# Patient Record
Sex: Male | Born: 1940 | Race: Black or African American | Hispanic: No | Marital: Married | Smoking: Former smoker
Health system: Southern US, Community
[De-identification: ages and names within clinical notes are randomized; demographics above are authoritative.]

## PROBLEM LIST (undated history)

## (undated) DIAGNOSIS — M79662 Pain in left lower leg: Secondary | ICD-10-CM

## (undated) DIAGNOSIS — E7800 Pure hypercholesterolemia, unspecified: Secondary | ICD-10-CM

## (undated) DIAGNOSIS — I739 Peripheral vascular disease, unspecified: Secondary | ICD-10-CM

## (undated) DIAGNOSIS — I509 Heart failure, unspecified: Secondary | ICD-10-CM

## (undated) DIAGNOSIS — M5416 Radiculopathy, lumbar region: Secondary | ICD-10-CM

## (undated) DIAGNOSIS — M1990 Unspecified osteoarthritis, unspecified site: Secondary | ICD-10-CM

## (undated) DIAGNOSIS — I10 Essential (primary) hypertension: Secondary | ICD-10-CM

## (undated) DIAGNOSIS — E785 Hyperlipidemia, unspecified: Secondary | ICD-10-CM

## (undated) DIAGNOSIS — Z973 Presence of spectacles and contact lenses: Secondary | ICD-10-CM

## (undated) DIAGNOSIS — I779 Disorder of arteries and arterioles, unspecified: Secondary | ICD-10-CM

## (undated) DIAGNOSIS — K219 Gastro-esophageal reflux disease without esophagitis: Secondary | ICD-10-CM

## (undated) DIAGNOSIS — E119 Type 2 diabetes mellitus without complications: Secondary | ICD-10-CM

## (undated) DIAGNOSIS — R011 Cardiac murmur, unspecified: Secondary | ICD-10-CM

## (undated) DIAGNOSIS — M545 Low back pain: Secondary | ICD-10-CM

## (undated) HISTORY — DX: Pain in left lower leg: M79.662

## (undated) HISTORY — PX: CATARACT EXTRACTION W/ INTRAOCULAR LENS  IMPLANT, BILATERAL: SHX1307

## (undated) HISTORY — DX: Type 2 diabetes mellitus without complications: E11.9

## (undated) HISTORY — DX: Disorder of arteries and arterioles, unspecified: I77.9

## (undated) HISTORY — PX: EYE SURGERY: SHX253

## (undated) HISTORY — PX: COLONOSCOPY: SHX174

## (undated) HISTORY — PX: MULTIPLE TOOTH EXTRACTIONS: SHX2053

## (undated) HISTORY — DX: Radiculopathy, lumbar region: M54.16

## (undated) HISTORY — DX: Hyperlipidemia, unspecified: E78.5

## (undated) HISTORY — DX: Low back pain: M54.5

## (undated) HISTORY — DX: Pure hypercholesterolemia, unspecified: E78.00

## (undated) HISTORY — PX: CIRCUMCISION: SUR203

## (undated) HISTORY — DX: Unspecified osteoarthritis, unspecified site: M19.90

## (undated) HISTORY — DX: Essential (primary) hypertension: I10

## (undated) HISTORY — PX: TONSILLECTOMY: SUR1361

## (undated) HISTORY — DX: Peripheral vascular disease, unspecified: I73.9

## (undated) HISTORY — DX: Cardiac murmur, unspecified: R01.1

---

## 2002-05-16 ENCOUNTER — Encounter: Payer: Self-pay | Admitting: Emergency Medicine

## 2002-05-16 ENCOUNTER — Emergency Department (HOSPITAL_COMMUNITY): Admission: EM | Admit: 2002-05-16 | Discharge: 2002-05-16 | Payer: Self-pay | Admitting: *Deleted

## 2004-09-10 ENCOUNTER — Ambulatory Visit (HOSPITAL_COMMUNITY): Admission: RE | Admit: 2004-09-10 | Discharge: 2004-09-10 | Payer: Self-pay | Admitting: Internal Medicine

## 2004-09-10 ENCOUNTER — Ambulatory Visit: Payer: Self-pay | Admitting: Internal Medicine

## 2009-04-13 ENCOUNTER — Emergency Department (HOSPITAL_COMMUNITY): Admission: EM | Admit: 2009-04-13 | Discharge: 2009-04-13 | Payer: Self-pay | Admitting: Emergency Medicine

## 2009-04-14 ENCOUNTER — Ambulatory Visit (HOSPITAL_COMMUNITY): Admission: RE | Admit: 2009-04-14 | Discharge: 2009-04-14 | Payer: Self-pay | Admitting: Internal Medicine

## 2009-10-17 ENCOUNTER — Encounter: Payer: Self-pay | Admitting: Internal Medicine

## 2009-11-04 ENCOUNTER — Ambulatory Visit: Payer: Self-pay | Admitting: Internal Medicine

## 2009-11-04 ENCOUNTER — Ambulatory Visit (HOSPITAL_COMMUNITY): Admission: RE | Admit: 2009-11-04 | Discharge: 2009-11-04 | Payer: Self-pay | Admitting: Internal Medicine

## 2010-03-24 NOTE — Letter (Signed)
Summary: TRIAGE  TRIAGE   Imported By: Rosine Beat 10/17/2009 11:52:30  _____________________________________________________________________  External Attachment:    Type:   Image     Comment:   External Document

## 2010-05-07 LAB — GLUCOSE, CAPILLARY: Glucose-Capillary: 222 mg/dL — ABNORMAL HIGH (ref 70–99)

## 2010-05-15 LAB — URINALYSIS, ROUTINE W REFLEX MICROSCOPIC
Bilirubin Urine: NEGATIVE
Glucose, UA: NEGATIVE mg/dL
Hgb urine dipstick: NEGATIVE
Specific Gravity, Urine: 1.009 (ref 1.005–1.030)
pH: 7 (ref 5.0–8.0)

## 2010-05-15 LAB — URINE CULTURE

## 2010-07-10 NOTE — Op Note (Signed)
[name], Jeremy Patton           ACCOUNT NO.:  [id number]   MEDICAL RECORD NO.:  [id number]          PATIENT TYPE:  AMB   LOCATION:  DAY                           FACILITY:  [facility]   PHYSICIAN:  R. Roetta Sessions, M.D. DATE OF BIRTH:  Nov 04, 1940   DATE OF PROCEDURE:  09/10/2004  DATE OF DISCHARGE:                                 OPERATIVE REPORT   PROCEDURE:  Screening colonoscopy.   INDICATIONS FOR PROCEDURE:  The patient is a 70 year old gentleman with a  positive family history of colorectal cancer in his brother who was  diagnosed with the disease at age 24. Jeremy Patton had a colonoscopy in  2001 which revealed left sided diverticula. He is devoid of any lower GI  tract symptoms. He comes for high risk screening. This approach has been  discussed with the patient at length. The potential risks, benefits, and  alternatives have been reviewed, questions answered. He is agreeable. Please  see documentation in medical record.   MONITORING:  O2 saturation, blood pressure, pulse and respirations were  monitored throughout the entire procedure.   CONSCIOUS SEDATION:  Versed 2 mg IV, Demerol 50 mg IV in divided doses.   INSTRUMENT:  Olympus videochip system.   FINDINGS:  Digital rectal exam revealed no abnormalities.   ENDOSCOPIC FINDINGS:  Prep was adequate.   RECTUM:  Examination of the rectal mucosa including retroflexed view of the  anal verge revealed only internal hemorrhoids.   COLON:  The colonic mucosa was surveyed from the rectosigmoid junction  through the left transverse right colon to the area of the appendiceal  orifice, ileocecal valve and cecum. These structures were well seen and  photographed for the record. From this level, the scope was slowly  withdrawn. All previously mentioned mucosal surfaces were again seen. The  patient was noted to have scattered sigmoid diverticula. The remaining  colonic mucosa appeared normal. The patient tolerated the procedure  well and  was reacted in endoscopy.   IMPRESSION:  1.  Internal hemorrhoids, minimal, otherwise normal rectum.  2.  Sigmoid diverticula. The remainder of the colonic mucosa appeared      normal.   RECOMMENDATIONS:  1.  Repeat screening colonoscopy in five years.  2.  Diverticulosis literature provided to Jeremy Patton.       RMR/MEDQ  D:  09/10/2004  T:  09/10/2004  Job:  [id number]

## 2010-09-17 ENCOUNTER — Ambulatory Visit (HOSPITAL_COMMUNITY)
Admission: RE | Admit: 2010-09-17 | Discharge: 2010-09-17 | Disposition: A | Discharge status: Home / Self Care | Payer: Medicare Other | Source: Ambulatory Visit | Attending: Internal Medicine | Admitting: Internal Medicine

## 2010-09-17 ENCOUNTER — Other Ambulatory Visit (HOSPITAL_COMMUNITY): Payer: Self-pay | Admitting: Internal Medicine

## 2010-09-17 DIAGNOSIS — R609 Edema, unspecified: Secondary | ICD-10-CM

## 2010-09-17 DIAGNOSIS — M899 Disorder of bone, unspecified: Secondary | ICD-10-CM | POA: Insufficient documentation

## 2010-09-17 DIAGNOSIS — M79609 Pain in unspecified limb: Secondary | ICD-10-CM | POA: Insufficient documentation

## 2010-09-17 DIAGNOSIS — E119 Type 2 diabetes mellitus without complications: Secondary | ICD-10-CM | POA: Insufficient documentation

## 2011-09-28 ENCOUNTER — Ambulatory Visit (INDEPENDENT_AMBULATORY_CARE_PROVIDER_SITE_OTHER): Payer: Medicare Other | Admitting: Ophthalmology

## 2011-09-30 ENCOUNTER — Ambulatory Visit (INDEPENDENT_AMBULATORY_CARE_PROVIDER_SITE_OTHER): Payer: Medicare Other | Admitting: Ophthalmology

## 2011-09-30 DIAGNOSIS — H35039 Hypertensive retinopathy, unspecified eye: Secondary | ICD-10-CM

## 2011-09-30 DIAGNOSIS — I10 Essential (primary) hypertension: Secondary | ICD-10-CM

## 2011-09-30 DIAGNOSIS — E11359 Type 2 diabetes mellitus with proliferative diabetic retinopathy without macular edema: Secondary | ICD-10-CM

## 2011-09-30 DIAGNOSIS — H40019 Open angle with borderline findings, low risk, unspecified eye: Secondary | ICD-10-CM

## 2011-09-30 DIAGNOSIS — E1165 Type 2 diabetes mellitus with hyperglycemia: Secondary | ICD-10-CM

## 2011-09-30 DIAGNOSIS — H43819 Vitreous degeneration, unspecified eye: Secondary | ICD-10-CM

## 2011-10-15 ENCOUNTER — Ambulatory Visit (INDEPENDENT_AMBULATORY_CARE_PROVIDER_SITE_OTHER): Payer: Medicare Other | Admitting: Ophthalmology

## 2011-11-29 DIAGNOSIS — I10 Essential (primary) hypertension: Secondary | ICD-10-CM

## 2011-11-29 DIAGNOSIS — E785 Hyperlipidemia, unspecified: Secondary | ICD-10-CM

## 2011-11-29 DIAGNOSIS — M545 Low back pain, unspecified: Secondary | ICD-10-CM

## 2011-11-29 DIAGNOSIS — E119 Type 2 diabetes mellitus without complications: Secondary | ICD-10-CM

## 2011-11-29 DIAGNOSIS — M5416 Radiculopathy, lumbar region: Secondary | ICD-10-CM

## 2011-11-29 HISTORY — DX: Radiculopathy, lumbar region: M54.16

## 2011-11-29 HISTORY — DX: Type 2 diabetes mellitus without complications: E11.9

## 2011-11-29 HISTORY — DX: Low back pain, unspecified: M54.50

## 2011-11-29 HISTORY — DX: Essential (primary) hypertension: I10

## 2011-11-29 HISTORY — DX: Hyperlipidemia, unspecified: E78.5

## 2011-11-30 ENCOUNTER — Other Ambulatory Visit (HOSPITAL_COMMUNITY): Payer: Self-pay | Admitting: Internal Medicine

## 2011-11-30 DIAGNOSIS — R0989 Other specified symptoms and signs involving the circulatory and respiratory systems: Secondary | ICD-10-CM

## 2011-12-02 ENCOUNTER — Ambulatory Visit (HOSPITAL_COMMUNITY)
Admission: RE | Admit: 2011-12-02 | Discharge: 2011-12-02 | Disposition: A | Discharge status: Home / Self Care | Payer: Medicare Other | Source: Ambulatory Visit | Attending: Internal Medicine | Admitting: Internal Medicine

## 2011-12-02 ENCOUNTER — Other Ambulatory Visit (HOSPITAL_COMMUNITY): Payer: Self-pay | Admitting: Internal Medicine

## 2011-12-02 ENCOUNTER — Other Ambulatory Visit (HOSPITAL_COMMUNITY): Payer: Medicare Other

## 2011-12-02 DIAGNOSIS — I6529 Occlusion and stenosis of unspecified carotid artery: Secondary | ICD-10-CM | POA: Insufficient documentation

## 2011-12-02 DIAGNOSIS — R0989 Other specified symptoms and signs involving the circulatory and respiratory systems: Secondary | ICD-10-CM

## 2011-12-06 ENCOUNTER — Other Ambulatory Visit: Payer: Self-pay

## 2011-12-06 DIAGNOSIS — I6529 Occlusion and stenosis of unspecified carotid artery: Secondary | ICD-10-CM

## 2011-12-09 ENCOUNTER — Encounter: Payer: Self-pay | Admitting: Vascular Surgery

## 2011-12-16 ENCOUNTER — Encounter: Payer: Self-pay | Admitting: Vascular Surgery

## 2011-12-17 ENCOUNTER — Encounter: Payer: Self-pay | Admitting: Vascular Surgery

## 2011-12-17 ENCOUNTER — Other Ambulatory Visit (INDEPENDENT_AMBULATORY_CARE_PROVIDER_SITE_OTHER): Payer: Medicare Other | Admitting: *Deleted

## 2011-12-17 ENCOUNTER — Ambulatory Visit (INDEPENDENT_AMBULATORY_CARE_PROVIDER_SITE_OTHER): Payer: Medicare Other | Admitting: Vascular Surgery

## 2011-12-17 VITALS — BP 171/91 | HR 76 | Resp 18 | Ht 70.5 in | Wt 192.0 lb

## 2011-12-17 DIAGNOSIS — I6529 Occlusion and stenosis of unspecified carotid artery: Secondary | ICD-10-CM

## 2011-12-17 DIAGNOSIS — I6523 Occlusion and stenosis of bilateral carotid arteries: Secondary | ICD-10-CM | POA: Insufficient documentation

## 2011-12-17 NOTE — Progress Notes (Signed)
[facility] [place]  New Carotid Patient  Referred by:  Carylon Perches, MD [address] [place], [place] [address]  Reason for referral: Bilateral carotid stenosis  History of Present Illness  Jeremy Patton is a 71 y.o. (1941/01/27) male who presents with chief complaint: "problem with my neck arteries."  Previous carotid studies demonstrated: RICA >70% stenosis, LICA 50-69% stenosis.  Patient has no history of TIA or stroke symptom.  The patient has never had amaurosis fugax or monocular blindness.  He has had some diplopia.  The patient has never had facial drooping or hemiplegia.  The patient has never had receptive or expressive aphasia.   The patient's risks factors for carotid disease include: DM, hyperlipidemia, HTN, prior smoking.  He notes some sx suggestive BPPV.  Past Medical History  Diagnosis Date  . Diabetes mellitus without complication 11/29/2011  . Hyperlipidemia 11/29/2011  . Low back pain 11/29/2011  . Lumbar radiculopathy 11/29/2011    Left  . Hypertension 11/29/2011  . Pain of left lower leg   . Carotid artery occlusion     Bruit, Right    Past Surgical History  Procedure Date  . Colonoscopy 08/22/2005 and  07/31/2009  . Tonsillectomy   . Circumcision     Age 24    History   Social History  . Marital Status: Married    Spouse Name: N/A    Number of Children: N/A  . Years of Education: N/A   Occupational History  . Not on file.   Social History Main Topics  . Smoking status: Former Smoker -- 30 years    Types: Cigarettes    Quit date: 12/17/1991  . Smokeless tobacco: Former Neurosurgeon    Quit date: 12/17/1986  . Alcohol Use: No  . Drug Use: No  . Sexually Active: Not on file   Other Topics Concern  . Not on file   Social History Narrative  . No narrative on file    Family History  Problem Relation Age of Onset  . Cancer Brother     colon  . Stroke Mother   . Heart disease Mother     Current Outpatient  Prescriptions on File Prior to Visit  Medication Sig Dispense Refill  . aspirin 81 MG tablet Take 81 mg by mouth daily.      . chlorproMAZINE (THORAZINE) 25 MG tablet Take 25 mg by mouth every 6 (six) hours.      . gabapentin (NEURONTIN) 300 MG capsule 300 mg 3 (three) times daily.       Marland Kitchen HUMALOG 100 UNIT/ML injection 3 Units.       Marland Kitchen LANTUS 100 UNIT/ML injection 26 Units daily.       Marland Kitchen lisinopril-hydrochlorothiazide (PRINZIDE,ZESTORETIC) 20-12.5 MG per tablet 2 tablets daily.       . metFORMIN (GLUCOPHAGE) 1000 MG tablet 1,000 mg 2 (two) times daily.       . simvastatin (ZOCOR) 10 MG tablet Take 10 mg by mouth at bedtime.      . Tamsulosin HCl (FLOMAX) 0.4 MG CAPS 0.4 mg daily.       . verapamil (VERELAN PM) 180 MG 24 hr capsule         No Known Allergies  REVIEW OF SYSTEMS:  (Positives checked otherwise negative)  CARDIOVASCULAR:  [ ]  chest pain, [ ]  chest pressure, [ ]  palpitations, [ ]  shortness of breath when laying flat, [ ]  shortness of breath with exertion,   [ ]   pain in feet when walking, [ ]  pain in feet when laying flat, [ ]  history of blood clot in veins (DVT), [ ]  history of phlebitis, [ ]  swelling in legs, [ ]  varicose veins  PULMONARY:  [ ]  productive cough, [ ]  asthma, [ ]  wheezing  NEUROLOGIC:  [ ]  weakness in arms or legs, [ ]  numbness in arms or legs, [ ]  difficulty speaking or slurred speech, [ ]  temporary loss of vision in one eye, [ ]  dizziness  HEMATOLOGIC:  [ ]  bleeding problems, [ ]  problems with blood clotting too easily  MUSCULOSKEL:  [ ]  joint pain, [ ]  joint swelling  GASTROINTEST:  [ ]   Vomiting blood, [ ]   Blood in stool     GENITOURINARY:  [ ]   Burning with urination, [ ]   Blood in urine  PSYCHIATRIC:  [ ]  history of major depression  INTEGUMENTARY:  [ ]  rashes, [ ]  ulcers  CONSTITUTIONAL:  [ ]  fever, [ ]  chills  Physical Examination  Filed Vitals:   12/17/11 1057 12/17/11 1059  BP: 170/81 171/91  Pulse: 74 76  Resp: 18   Height: 5' 10.5"  (1.791 m)   Weight: 192 lb (87.091 kg)    Body mass index is 27.16 kg/(m^2).  General: A&O x 3, WDWN  Head: [place]/AT  Ear/Nose/Throat: Hearing grossly intact, nares w/o erythema or drainage, oropharynx w/o Erythema/Exudate  Eyes: PERRLA, EOMI  Neck: Supple, no nuchal rigidity, no palpable LAD  Pulmonary: Sym exp, good air movt, CTAB, no rales, rhonchi, & wheezing  Cardiac: RRR, Nl S1, S2, no Murmurs, rubs or gallops  Vascular: Vessel Right Left  Radial Palpable Palpable  Ulnar Palpable Palpable  Brachial Palpable Palpable  Carotid Palpable, without bruit Palpable, without bruit  Aorta Not palpable N/A  Femoral Palpable Palpable  Popliteal Not palpable Not palpable  PT Palpable Palpable  DP Palpable Palpable   Gastrointestinal: soft, NTND, -G/R, - HSM, - masses, - CVAT B  Musculoskeletal: M/S 5/5 throughout , Extremities without ischemic changes   Neurologic: CN 2-12 intact , Pain and light touch intact in extremities , Motor exam as listed above  Psychiatric: Judgment intact, Mood & affect appropriate for pt's clinical situation  Dermatologic: See M/S exam for extremity exam, no rashes otherwise noted  Lymph : No Cervical, Axillary, or Inguinal lymphadenopathy   Non-Invasive Vascular Imaging  R CAROTID DUPLEX (Date: 12/17/11):   R ICA stenosis: 60-79% (286/81, ICA/CCA 3.71)  Patent and antegrade R VA  Outside Studies/Documentation 4 pages of outside documents were reviewed including: outside carotid duplex.  Medical Decision Making  Jeremy Patton is a 71 y.o. male who presents with: asx B ICA stenosis < 80%   Based on the patient's vascular studies and examination, I have offered the patient: routine surveillance every 6 months.  No advantage to intervention at this point as asx.  If he should develop a stroke or TIA, NASCET would suggest benefit from CEA,  I discussed in depth with the patient the nature of atherosclerosis, and emphasized the  importance of maximal medical management including strict control of blood pressure, blood glucose, and lipid levels, obtaining regular exercise, antiplatelet agents, and cessation of smoking.    The patient is aware that without maximal medical management the underlying atherosclerotic disease process will progress, limiting the benefit of any interventions.  Thank you for allowing [place] to participate in this patient's care.  Leonides Sake, MD [facility] Office: [phone] Pager: [phone]  12/17/2011, 1:00 PM

## 2012-06-16 ENCOUNTER — Other Ambulatory Visit: Payer: Medicare Other

## 2012-06-16 ENCOUNTER — Ambulatory Visit: Payer: Medicare Other | Admitting: Vascular Surgery

## 2012-10-27 ENCOUNTER — Ambulatory Visit (INDEPENDENT_AMBULATORY_CARE_PROVIDER_SITE_OTHER): Payer: Medicare HMO | Admitting: Ophthalmology

## 2012-11-17 ENCOUNTER — Ambulatory Visit (INDEPENDENT_AMBULATORY_CARE_PROVIDER_SITE_OTHER): Payer: Medicare HMO | Admitting: Ophthalmology

## 2013-09-03 ENCOUNTER — Other Ambulatory Visit: Payer: Self-pay | Admitting: *Deleted

## 2013-09-03 DIAGNOSIS — I6529 Occlusion and stenosis of unspecified carotid artery: Secondary | ICD-10-CM

## 2013-09-13 ENCOUNTER — Encounter: Payer: Self-pay | Admitting: Vascular Surgery

## 2013-09-14 ENCOUNTER — Inpatient Hospital Stay (HOSPITAL_COMMUNITY): Admission: RE | Admit: 2013-09-14 | Payer: Medicare Other | Source: Ambulatory Visit

## 2013-09-14 ENCOUNTER — Ambulatory Visit: Payer: Medicare Other | Admitting: Vascular Surgery

## 2013-10-18 ENCOUNTER — Encounter: Payer: Self-pay | Admitting: Vascular Surgery

## 2013-10-19 ENCOUNTER — Other Ambulatory Visit (HOSPITAL_COMMUNITY): Payer: Medicare Other

## 2013-10-19 ENCOUNTER — Ambulatory Visit: Payer: Medicare Other | Admitting: Vascular Surgery

## 2013-11-12 ENCOUNTER — Ambulatory Visit (INDEPENDENT_AMBULATORY_CARE_PROVIDER_SITE_OTHER): Payer: Medicare HMO | Admitting: Ophthalmology

## 2013-11-12 DIAGNOSIS — H43819 Vitreous degeneration, unspecified eye: Secondary | ICD-10-CM

## 2013-11-12 DIAGNOSIS — E11359 Type 2 diabetes mellitus with proliferative diabetic retinopathy without macular edema: Secondary | ICD-10-CM

## 2013-11-12 DIAGNOSIS — E1065 Type 1 diabetes mellitus with hyperglycemia: Secondary | ICD-10-CM

## 2013-11-12 DIAGNOSIS — E1039 Type 1 diabetes mellitus with other diabetic ophthalmic complication: Secondary | ICD-10-CM

## 2013-11-12 DIAGNOSIS — H35039 Hypertensive retinopathy, unspecified eye: Secondary | ICD-10-CM

## 2013-11-12 DIAGNOSIS — I10 Essential (primary) hypertension: Secondary | ICD-10-CM

## 2013-11-15 ENCOUNTER — Encounter: Payer: Self-pay | Admitting: Vascular Surgery

## 2013-11-16 ENCOUNTER — Encounter: Payer: Self-pay | Admitting: Vascular Surgery

## 2013-11-16 ENCOUNTER — Ambulatory Visit (HOSPITAL_COMMUNITY)
Admission: RE | Admit: 2013-11-16 | Discharge: 2013-11-16 | Disposition: A | Discharge status: Home / Self Care | Payer: Medicare FFS | Source: Ambulatory Visit | Attending: Vascular Surgery | Admitting: Vascular Surgery

## 2013-11-16 ENCOUNTER — Ambulatory Visit (INDEPENDENT_AMBULATORY_CARE_PROVIDER_SITE_OTHER): Payer: Medicare FFS | Admitting: Vascular Surgery

## 2013-11-16 VITALS — BP 167/82 | HR 78 | Resp 18 | Ht 71.0 in | Wt 180.0 lb

## 2013-11-16 DIAGNOSIS — I6529 Occlusion and stenosis of unspecified carotid artery: Secondary | ICD-10-CM | POA: Insufficient documentation

## 2013-11-16 DIAGNOSIS — I658 Occlusion and stenosis of other precerebral arteries: Secondary | ICD-10-CM

## 2013-11-16 DIAGNOSIS — I6523 Occlusion and stenosis of bilateral carotid arteries: Secondary | ICD-10-CM

## 2013-11-16 NOTE — Progress Notes (Signed)
    Established Carotid Patient  History of Present Illness  Jeremy Patton is a 73 y.o. ([birth year]) male who presents with chief complaint: dizziness.  Previous carotid studies demonstrated: RICA 27-78% stenosis, LICA 24-23% stenosis.  Patient has no history of TIA or stroke symptom.  The patient has never had amaurosis fugax or monocular blindness.  The patient has never had facial drooping or hemiplegia.  The patient has never had receptive or expressive aphasia.    The patient's PMH, PSH, SH, FamHx, Med, and Allergies are unchanged from 12/17/11.  On ROS today: continue sx suggest of BPPV, no other vertebrobasilar sx  Physical Examination  Filed Vitals:   11/16/13 1210 11/16/13 1219  BP: 171/88 167/82  Pulse: 76 78  Resp: 18   Height: 5\' 11"  (1.803 m)   Weight: 180 lb (81.647 kg)    Body mass index is 25.12 kg/(m^2).  General: A&O x 3, WDWN  Eyes: PERRLA, EOMI  Neck: Supple, no nuchal rigidity, no palpable LAD  Pulmonary: Sym exp, good air movt, CTAB, no rales, rhonchi, & wheezing  Cardiac: RRR, Nl S1, S2, no Murmurs, rubs or gallops  Vascular: Vessel Right Left  Radial Palpable Palpable  Brachial Palpable Palpable  Carotid Palpable, without bruit Palpable, without bruit  Aorta Not palpable N/A  Femoral Palpable Palpable  Popliteal Not palpable Not palpable  PT Faintly Palpable Faintly Palpable  DP Faintly Palpable Faintly Palpable   Gastrointestinal: soft, NTND, -G/R, - HSM, - masses, - CVAT B  Musculoskeletal: M/S 5/5 throughout , Extremities without ischemic changes   Neurologic: CN 2-12 intact , Pain and light touch intact in extremities , Motor exam as listed above  Non-Invasive Vascular Imaging  CAROTID DUPLEX (Date: 11/16/2013):   R ICA stenosis: 60-79%  R VA: patent and antegrade  L ICA stenosis: 40-59%  L VA: patent and antegrade  Medical Decision Making  Jeremy Patton is a 73 y.o. male who presents with: asx R ICA stenosis  60-79%., asx L ICA stenosis 40-59%   Based on the patient's vascular studies and examination, I have offered the patient: annual B carotid stenosis.  I discussed in depth with the patient the nature of atherosclerosis, and emphasized the importance of maximal medical management including strict control of blood pressure, blood glucose, and lipid levels, antiplatelet agents, obtaining regular exercise, and cessation of smoking.    The patient is aware that without maximal medical management the underlying atherosclerotic disease process will progress, limiting the benefit of any interventions. The patient is currently on a statin: Zocor. The patient is currently on an anti-platelet: ASA.  Thank you for allowing [place] to participate in this patient's care.  Adele Barthel, MD [facility] Office: [phone] Pager: [phone]  11/16/2013, 3:20 PM

## 2013-11-16 NOTE — Addendum Note (Signed)
Addended by: Mena Goes on: 11/16/2013 05:03 PM   Modules accepted: Orders

## 2014-03-22 ENCOUNTER — Ambulatory Visit (INDEPENDENT_AMBULATORY_CARE_PROVIDER_SITE_OTHER): Payer: Medicare HMO | Admitting: Ophthalmology

## 2014-05-17 ENCOUNTER — Ambulatory Visit: Payer: Medicare FFS | Admitting: Family

## 2014-05-17 ENCOUNTER — Other Ambulatory Visit (HOSPITAL_COMMUNITY): Payer: Medicare FFS

## 2014-09-24 ENCOUNTER — Encounter: Payer: Self-pay | Admitting: Internal Medicine

## 2014-11-01 ENCOUNTER — Ambulatory Visit (INDEPENDENT_AMBULATORY_CARE_PROVIDER_SITE_OTHER): Payer: Medicare PPO | Admitting: Gastroenterology

## 2014-11-01 ENCOUNTER — Other Ambulatory Visit: Payer: Self-pay

## 2014-11-01 ENCOUNTER — Encounter: Payer: Self-pay | Admitting: Gastroenterology

## 2014-11-01 VITALS — BP 168/80 | HR 80 | Temp 97.0°F | Ht 71.0 in | Wt 182.4 lb

## 2014-11-01 DIAGNOSIS — Z800 Family history of malignant neoplasm of digestive organs: Secondary | ICD-10-CM | POA: Diagnosis not present

## 2014-11-01 DIAGNOSIS — R1314 Dysphagia, pharyngoesophageal phase: Secondary | ICD-10-CM

## 2014-11-01 DIAGNOSIS — K219 Gastro-esophageal reflux disease without esophagitis: Secondary | ICD-10-CM

## 2014-11-01 DIAGNOSIS — R1310 Dysphagia, unspecified: Secondary | ICD-10-CM | POA: Insufficient documentation

## 2014-11-01 DIAGNOSIS — R1319 Other dysphagia: Secondary | ICD-10-CM | POA: Insufficient documentation

## 2014-11-01 MED ORDER — PEG 3350-KCL-NA BICARB-NACL 420 G PO SOLR: 4000.0000 mL | Freq: Once | ORAL | Status: DC

## 2014-11-01 NOTE — Patient Instructions (Signed)
1. Upper endoscopy and colonoscopy with Dr. Rourk. See separate instructions. 

## 2014-11-01 NOTE — Assessment & Plan Note (Signed)
Due for high risk screening colonoscopy.  I have discussed the risks, alternatives, benefits with regards to but not limited to the risk of reaction to medication, bleeding, infection, perforation and the patient is agreeable to proceed. Written consent to be obtained.  

## 2014-11-01 NOTE — Progress Notes (Signed)
Primary Care Physician:  Asencion Noble, MD  Primary Gastroenterologist:  Garfield Cornea, MD   Chief Complaint  Patient presents with  . Colonoscopy    HPI:  Jeremy Patton is a 74 y.o. male here to schedule high risk rating colonoscopy. Brother died with colon cancer at age 18. Patient's last colonoscopy in 2011, no polyps. Denies any lower GI symptoms. No blood in the stool or melena. No weight loss. No constipation, diarrhea, abdominal pain. He complains of chronic GERD, more than 5 years in duration. Has self medicated for years with baking soda and water. Takes at least once daily. Recent flare in heartburn which he describes as severe. Occurred when he forgot to take baking soda for several days while visiting his son. Symptoms are also associated with vague solid food esophageal dysphagia. Feels like he washes his food down at times more than he should have to. Denies pill dysphagia. No prior upper endoscopy.   Current Outpatient Prescriptions  Medication Sig Dispense Refill  . aspirin 81 MG tablet Take 81 mg by mouth daily.    . chlorproMAZINE (THORAZINE) 25 MG tablet Take 25 mg by mouth every 6 (six) hours.    . gabapentin (NEURONTIN) 300 MG capsule 300 mg 3 (three) times daily.     Marland Kitchen HUMALOG 100 UNIT/ML injection Inject 3 Units into the skin every morning.     Marland Kitchen LANTUS 100 UNIT/ML injection Inject 30 Units into the skin every morning.     Marland Kitchen lisinopril-hydrochlorothiazide (PRINZIDE,ZESTORETIC) 20-12.5 MG per tablet 2 tablets daily.     . metFORMIN (GLUCOPHAGE) 1000 MG tablet 1,000 mg 2 (two) times daily with a meal.     . simvastatin (ZOCOR) 10 MG tablet Take 10 mg by mouth at bedtime.    Marland Kitchen spironolactone (ALDACTONE) 25 MG tablet     . Tamsulosin HCl (FLOMAX) 0.4 MG CAPS 0.4 mg daily.     . verapamil (VERELAN PM) 180 MG 24 hr capsule     . polyethylene glycol-electrolytes (NULYTELY/GOLYTELY) 420 G solution Take 4,000 mLs by mouth once. 4000 mL 0   No current facility-administered  medications for this visit.    Allergies as of 11/01/2014  . (No Known Allergies)    Past Medical History  Diagnosis Date  . Diabetes mellitus without complication 86/08/6718  . Hyperlipidemia 11/29/2011  . Low back pain 11/29/2011  . Lumbar radiculopathy 11/29/2011    Left  . Hypertension 11/29/2011  . Pain of left lower leg   . Carotid artery occlusion     Bruit, Right    Past Surgical History  Procedure Laterality Date  . Colonoscopy  08/22/2005 and  07/31/2009    minimal internal hemorrhoids, left-sided diverticulosis  . Tonsillectomy    . Circumcision      Age 1    Family History  Problem Relation Age of Onset  . Colon cancer Brother     Age 22  . Stroke Mother   . Heart disease Mother     Social History   Social History  . Marital Status: Married    Spouse Name: N/A  . Number of Children: N/A  . Years of Education: N/A   Occupational History  . Not on file.   Social History Main Topics  . Smoking status: Former Smoker -- 30 years    Types: Cigarettes    Quit date: 12/17/1991  . Smokeless tobacco: Former Systems developer    Quit date: 12/17/1986  . Alcohol Use: No  . Drug Use: No  .  Sexual Activity: Not on file   Other Topics Concern  . Not on file   Social History Narrative      ROS:  General: Negative for anorexia, weight loss, fever, chills, fatigue, weakness. Eyes: Negative for vision changes.  ENT: Negative for hoarseness, nasal congestion. CV: Negative for chest pain, angina, palpitations, dyspnea on exertion, peripheral edema.  Respiratory: Negative for dyspnea at rest, dyspnea on exertion, cough, sputum, wheezing.  GI: See history of present illness. GU:  Negative for dysuria, hematuria, urinary incontinence, urinary frequency, nocturnal urination.  MS: Negative for joint pain, low back pain.  Derm: Negative for rash or itching.  Neuro: Negative for weakness, abnormal sensation, seizure, frequent headaches, memory loss, confusion.  Psych:  Negative for anxiety, depression, suicidal ideation, hallucinations.  Endo: Negative for unusual weight change.  Heme: Negative for bruising or bleeding. Allergy: Negative for rash or hives.    Physical Examination:  BP 168/80 mmHg  Pulse 80  Temp(Src) 97 F (36.1 C) (Oral)  Ht 5\' 11"  (1.803 m)  Wt 182 lb 6.4 oz (82.736 kg)  BMI 25.45 kg/m2   General: Well-nourished, well-developed in no acute distress.  Head: Normocephalic, atraumatic.   Eyes: Conjunctiva pink, no icterus. Mouth: Oropharyngeal mucosa moist and pink , no lesions erythema or exudate. Neck: Supple without thyromegaly, masses, or lymphadenopathy.  Lungs: Clear to auscultation bilaterally.  Heart: Regular rate and rhythm, no murmurs rubs or gallops.  Abdomen: Bowel sounds are normal, nontender, nondistended, no hepatosplenomegaly or masses, no abdominal bruits or    hernia , no rebound or guarding.   Rectal: Deferred Extremities: No lower extremity edema. No clubbing or deformities.  Neuro: Alert and oriented x 4 , grossly normal neurologically.  Skin: Warm and dry, no rash or jaundice.   Psych: Alert and cooperative, normal mood and affect.

## 2014-11-01 NOTE — Assessment & Plan Note (Signed)
Chronic GERD for years, self-medicates with baking soda. Recent flare of symptoms. Vague solid food dysphagia as outlined. Cannot exclude esophagitis, esophageal web/ring/stricture. EGD +/- ED in near future.  I have discussed the risks, alternatives, benefits with regards to but not limited to the risk of reaction to medication, bleeding, infection, perforation and the patient is agreeable to proceed. Written consent to be obtained.

## 2014-11-04 NOTE — Progress Notes (Signed)
cc'ed to pcp °

## 2014-11-07 ENCOUNTER — Ambulatory Visit (INDEPENDENT_AMBULATORY_CARE_PROVIDER_SITE_OTHER): Payer: Medicare PPO | Admitting: Ophthalmology

## 2014-11-07 DIAGNOSIS — E10359 Type 1 diabetes mellitus with proliferative diabetic retinopathy without macular edema: Secondary | ICD-10-CM

## 2014-11-07 DIAGNOSIS — I10 Essential (primary) hypertension: Secondary | ICD-10-CM

## 2014-11-07 DIAGNOSIS — E10351 Type 1 diabetes mellitus with proliferative diabetic retinopathy with macular edema: Secondary | ICD-10-CM

## 2014-11-07 DIAGNOSIS — H43813 Vitreous degeneration, bilateral: Secondary | ICD-10-CM | POA: Diagnosis not present

## 2014-11-07 DIAGNOSIS — H35033 Hypertensive retinopathy, bilateral: Secondary | ICD-10-CM | POA: Diagnosis not present

## 2014-11-07 DIAGNOSIS — E10311 Type 1 diabetes mellitus with unspecified diabetic retinopathy with macular edema: Secondary | ICD-10-CM

## 2014-11-12 DIAGNOSIS — E119 Type 2 diabetes mellitus without complications: Secondary | ICD-10-CM | POA: Diagnosis not present

## 2014-11-14 ENCOUNTER — Other Ambulatory Visit: Payer: Self-pay

## 2014-11-14 MED ORDER — PEG 3350-KCL-NA BICARB-NACL 420 G PO SOLR: 4000.0000 mL | Freq: Once | ORAL | Status: DC

## 2014-11-19 DIAGNOSIS — I10 Essential (primary) hypertension: Secondary | ICD-10-CM | POA: Diagnosis not present

## 2014-11-19 DIAGNOSIS — Z6825 Body mass index (BMI) 25.0-25.9, adult: Secondary | ICD-10-CM | POA: Diagnosis not present

## 2014-11-19 DIAGNOSIS — I2510 Atherosclerotic heart disease of native coronary artery without angina pectoris: Secondary | ICD-10-CM | POA: Diagnosis not present

## 2014-11-19 DIAGNOSIS — Z23 Encounter for immunization: Secondary | ICD-10-CM | POA: Diagnosis not present

## 2014-11-19 DIAGNOSIS — E1139 Type 2 diabetes mellitus with other diabetic ophthalmic complication: Secondary | ICD-10-CM | POA: Diagnosis not present

## 2014-11-25 ENCOUNTER — Ambulatory Visit (HOSPITAL_COMMUNITY)
Admission: RE | Admit: 2014-11-25 | Discharge: 2014-11-25 | Disposition: A | Discharge status: Home Health | Payer: Medicare PPO | Source: Ambulatory Visit | Attending: Internal Medicine | Admitting: Internal Medicine

## 2014-11-25 ENCOUNTER — Encounter (HOSPITAL_COMMUNITY)
Admission: RE | Disposition: A | Discharge status: Home Health | Payer: Self-pay | Source: Ambulatory Visit | Attending: Internal Medicine

## 2014-11-25 ENCOUNTER — Encounter (HOSPITAL_COMMUNITY): Payer: Self-pay | Admitting: *Deleted

## 2014-11-25 DIAGNOSIS — Z860100 Personal history of colon polyps, unspecified: Secondary | ICD-10-CM | POA: Insufficient documentation

## 2014-11-25 DIAGNOSIS — Z79899 Other long term (current) drug therapy: Secondary | ICD-10-CM | POA: Insufficient documentation

## 2014-11-25 DIAGNOSIS — I10 Essential (primary) hypertension: Secondary | ICD-10-CM | POA: Diagnosis not present

## 2014-11-25 DIAGNOSIS — E785 Hyperlipidemia, unspecified: Secondary | ICD-10-CM | POA: Insufficient documentation

## 2014-11-25 DIAGNOSIS — K648 Other hemorrhoids: Secondary | ICD-10-CM | POA: Diagnosis not present

## 2014-11-25 DIAGNOSIS — E119 Type 2 diabetes mellitus without complications: Secondary | ICD-10-CM | POA: Insufficient documentation

## 2014-11-25 DIAGNOSIS — K635 Polyp of colon: Secondary | ICD-10-CM | POA: Diagnosis not present

## 2014-11-25 DIAGNOSIS — Z794 Long term (current) use of insulin: Secondary | ICD-10-CM | POA: Insufficient documentation

## 2014-11-25 DIAGNOSIS — D124 Benign neoplasm of descending colon: Secondary | ICD-10-CM | POA: Diagnosis not present

## 2014-11-25 DIAGNOSIS — Z800 Family history of malignant neoplasm of digestive organs: Secondary | ICD-10-CM | POA: Insufficient documentation

## 2014-11-25 DIAGNOSIS — Z7982 Long term (current) use of aspirin: Secondary | ICD-10-CM | POA: Insufficient documentation

## 2014-11-25 DIAGNOSIS — Z7984 Long term (current) use of oral hypoglycemic drugs: Secondary | ICD-10-CM | POA: Diagnosis not present

## 2014-11-25 DIAGNOSIS — K2270 Barrett's esophagus without dysplasia: Secondary | ICD-10-CM | POA: Diagnosis not present

## 2014-11-25 DIAGNOSIS — Z87891 Personal history of nicotine dependence: Secondary | ICD-10-CM | POA: Insufficient documentation

## 2014-11-25 DIAGNOSIS — K2100 Gastro-esophageal reflux disease with esophagitis, without bleeding: Secondary | ICD-10-CM | POA: Insufficient documentation

## 2014-11-25 DIAGNOSIS — K5730 Diverticulosis of large intestine without perforation or abscess without bleeding: Secondary | ICD-10-CM | POA: Insufficient documentation

## 2014-11-25 DIAGNOSIS — R1310 Dysphagia, unspecified: Secondary | ICD-10-CM | POA: Insufficient documentation

## 2014-11-25 DIAGNOSIS — K2210 Ulcer of esophagus without bleeding: Secondary | ICD-10-CM | POA: Diagnosis not present

## 2014-11-25 DIAGNOSIS — K3189 Other diseases of stomach and duodenum: Secondary | ICD-10-CM | POA: Insufficient documentation

## 2014-11-25 DIAGNOSIS — K2950 Unspecified chronic gastritis without bleeding: Secondary | ICD-10-CM | POA: Diagnosis not present

## 2014-11-25 DIAGNOSIS — K219 Gastro-esophageal reflux disease without esophagitis: Secondary | ICD-10-CM

## 2014-11-25 DIAGNOSIS — Z1211 Encounter for screening for malignant neoplasm of colon: Secondary | ICD-10-CM | POA: Insufficient documentation

## 2014-11-25 DIAGNOSIS — R1314 Dysphagia, pharyngoesophageal phase: Secondary | ICD-10-CM

## 2014-11-25 HISTORY — PX: ESOPHAGEAL DILATION: SHX303

## 2014-11-25 HISTORY — PX: ESOPHAGOGASTRODUODENOSCOPY: SHX5428

## 2014-11-25 HISTORY — PX: COLONOSCOPY: SHX5424

## 2014-11-25 LAB — GLUCOSE, CAPILLARY: Glucose-Capillary: 172 mg/dL — ABNORMAL HIGH (ref 65–99)

## 2014-11-25 SURGERY — COLONOSCOPY
Anesthesia: Moderate Sedation

## 2014-11-25 MED ORDER — MEPERIDINE HCL 100 MG/ML IJ SOLN
INTRAMUSCULAR | Status: DC | PRN
  Administered 2014-11-25 (×4): 25 mg via INTRAVENOUS

## 2014-11-25 MED ORDER — STERILE WATER FOR IRRIGATION IR SOLN
Status: DC | PRN
  Administered 2014-11-25: 10:00:00

## 2014-11-25 MED ORDER — LIDOCAINE VISCOUS 2 % MT SOLN
OROMUCOSAL | Status: DC | PRN
  Administered 2014-11-25: 8 mL via OROMUCOSAL

## 2014-11-25 MED ORDER — LIDOCAINE VISCOUS 2 % MT SOLN
OROMUCOSAL | Status: AC
  Filled 2014-11-25: qty 15

## 2014-11-25 MED ORDER — SODIUM CHLORIDE 0.9 % IV SOLN
INTRAVENOUS | Status: DC
  Administered 2014-11-25: 10:00:00 via INTRAVENOUS

## 2014-11-25 MED ORDER — MIDAZOLAM HCL 5 MG/5ML IJ SOLN
INTRAMUSCULAR | Status: DC | PRN
  Administered 2014-11-25: 2 mg via INTRAVENOUS
  Administered 2014-11-25 (×4): 1 mg via INTRAVENOUS

## 2014-11-25 MED ORDER — ONDANSETRON HCL 4 MG/2ML IJ SOLN
INTRAMUSCULAR | Status: DC | PRN
  Administered 2014-11-25: 4 mg via INTRAVENOUS

## 2014-11-25 MED ORDER — MEPERIDINE HCL 100 MG/ML IJ SOLN
INTRAMUSCULAR | Status: AC
  Filled 2014-11-25: qty 2

## 2014-11-25 MED ORDER — ONDANSETRON HCL 4 MG/2ML IJ SOLN
INTRAMUSCULAR | Status: AC
  Filled 2014-11-25: qty 2

## 2014-11-25 MED ORDER — MIDAZOLAM HCL 5 MG/5ML IJ SOLN
INTRAMUSCULAR | Status: AC
  Filled 2014-11-25: qty 10

## 2014-11-25 NOTE — Op Note (Signed)
[facility] [address] [place], [address]   COLONOSCOPY PROCEDURE REPORT  PATIENT: Jeremy, Patton  MR#: [id number] BIRTHDATE: 09-21-1940 , 25  yrs. old GENDER: male ENDOSCOPIST: R.  Garfield Cornea, MD FACP [facility] REFERRED BY:Roy Willey Blade, M.D. PROCEDURE DATE:  11-Dec-2014 PROCEDURE:   Colonoscopy with biopsy and snare polypectomy INDICATIONS:High risk screening examination. MEDICATIONS: Versed 6 mg IV and Demerol 100 mg IV in divided doses. Zofran 4 mg IV. ASA CLASS:       Class II  CONSENT: The risks, benefits, alternatives and imponderables including but not limited to bleeding, perforation as well as the possibility of a missed lesion have been reviewed.  The potential for biopsy, lesion removal, etc. have also been discussed. Questions have been answered.  All parties agreeable.  Please see the history and physical in the medical record for more information.  DESCRIPTION OF PROCEDURE:   After the risks benefits and alternatives of the procedure were thoroughly explained, informed consent was obtained.  The digital rectal exam revealed no abnormalities of the rectum.   The EC-[id number] ([id number])  endoscope was introduced through the anus and advanced to the cecum, which was identified by both the appendix and ileocecal valve. No adverse events experienced.   The quality of the prep was adequate  The instrument was then slowly withdrawn as the colon was fully examined. Estimated blood loss is zero unless otherwise noted in this procedure report.      COLON FINDINGS: Anal papilla and internal hemorrhoids; otherwise, normal-appearing rectal mucosa.  Patient had densely populated left-sided diverticulosis; (1) 6 mm pediculated polyp in the mid descending segment; there was an adenomatous appearing fold at the ileocecal valve.  It appeared to be a polyp.  I injected 3-4 mL of normal saline on the proximal side to get it to efface face so I could see it  better.  Ultimately ,this was felt to be a redundant fold ball valving at the ileocecal valve.  It was biopsied.  The descending colon polyp was removed with cold snare technique. Retroflexion was performed. .  Withdrawal time=18 minutes 0 seconds.  The scope was withdrawn and the procedure completed. COMPLICATIONS: There were no immediate complications. EBL 5 mL ENDOSCOPIC IMPRESSION: Colonic diverticulosis. Colonic polyp?"removed as described above. Abnormal ileocecal valve?"likely not clinically significant?"status post biopsy. Internal hemorrhoids and anal papilla  RECOMMENDATIONS: Follow up on pathology. See EGD report.  eSigned:  R. Garfield Cornea, MD [facility] 12/11/2014 11:04 AM   cc:  CPT CODES: ICD CODES:  The ICD and CPT codes recommended by this software are interpretations from the data that the clinical staff has captured with the software.  The verification of the translation of this report to the ICD and CPT codes and modifiers is the sole responsibility of the health care institution and practicing physician where this report was generated.  [employer] will not be held responsible for the validity of the ICD and CPT codes included on this report.  AMA assumes no liability for data contained or not contained herein. CPT is a Designer, television/film set of the [employer].  PATIENT NAME:  Jeremy, Patton MR#: [id number]

## 2014-11-25 NOTE — H&P (View-Only) (Signed)
Primary Care Physician:  Asencion Noble, MD  Primary Gastroenterologist:  Garfield Cornea, MD   Chief Complaint  Patient presents with  . Colonoscopy    HPI:  Jeremy Patton is a 74 y.o. male here to schedule high risk rating colonoscopy. Brother died with colon cancer at age 28. Patient's last colonoscopy in 2011, no polyps. Denies any lower GI symptoms. No blood in the stool or melena. No weight loss. No constipation, diarrhea, abdominal pain. He complains of chronic GERD, more than 5 years in duration. Has self medicated for years with baking soda and water. Takes at least once daily. Recent flare in heartburn which he describes as severe. Occurred when he forgot to take baking soda for several days while visiting his son. Symptoms are also associated with vague solid food esophageal dysphagia. Feels like he washes his food down at times more than he should have to. Denies pill dysphagia. No prior upper endoscopy.   Current Outpatient Prescriptions  Medication Sig Dispense Refill  . aspirin 81 MG tablet Take 81 mg by mouth daily.    . chlorproMAZINE (THORAZINE) 25 MG tablet Take 25 mg by mouth every 6 (six) hours.    . gabapentin (NEURONTIN) 300 MG capsule 300 mg 3 (three) times daily.     Marland Kitchen HUMALOG 100 UNIT/ML injection Inject 3 Units into the skin every morning.     Marland Kitchen LANTUS 100 UNIT/ML injection Inject 30 Units into the skin every morning.     Marland Kitchen lisinopril-hydrochlorothiazide (PRINZIDE,ZESTORETIC) 20-12.5 MG per tablet 2 tablets daily.     . metFORMIN (GLUCOPHAGE) 1000 MG tablet 1,000 mg 2 (two) times daily with a meal.     . simvastatin (ZOCOR) 10 MG tablet Take 10 mg by mouth at bedtime.    Marland Kitchen spironolactone (ALDACTONE) 25 MG tablet     . Tamsulosin HCl (FLOMAX) 0.4 MG CAPS 0.4 mg daily.     . verapamil (VERELAN PM) 180 MG 24 hr capsule     . polyethylene glycol-electrolytes (NULYTELY/GOLYTELY) 420 G solution Take 4,000 mLs by mouth once. 4000 mL 0   No current facility-administered  medications for this visit.    Allergies as of 11/01/2014  . (No Known Allergies)    Past Medical History  Diagnosis Date  . Diabetes mellitus without complication 31/06/4006  . Hyperlipidemia 11/29/2011  . Low back pain 11/29/2011  . Lumbar radiculopathy 11/29/2011    Left  . Hypertension 11/29/2011  . Pain of left lower leg   . Carotid artery occlusion     Bruit, Right    Past Surgical History  Procedure Laterality Date  . Colonoscopy  08/22/2005 and  07/31/2009    minimal internal hemorrhoids, left-sided diverticulosis  . Tonsillectomy    . Circumcision      Age 60    Family History  Problem Relation Age of Onset  . Colon cancer Brother     Age 61  . Stroke Mother   . Heart disease Mother     Social History   Social History  . Marital Status: Married    Spouse Name: N/A  . Number of Children: N/A  . Years of Education: N/A   Occupational History  . Not on file.   Social History Main Topics  . Smoking status: Former Smoker -- 30 years    Types: Cigarettes    Quit date: 12/17/1991  . Smokeless tobacco: Former Systems developer    Quit date: 12/17/1986  . Alcohol Use: No  . Drug Use: No  .  Sexual Activity: Not on file   Other Topics Concern  . Not on file   Social History Narrative      ROS:  General: Negative for anorexia, weight loss, fever, chills, fatigue, weakness. Eyes: Negative for vision changes.  ENT: Negative for hoarseness, nasal congestion. CV: Negative for chest pain, angina, palpitations, dyspnea on exertion, peripheral edema.  Respiratory: Negative for dyspnea at rest, dyspnea on exertion, cough, sputum, wheezing.  GI: See history of present illness. GU:  Negative for dysuria, hematuria, urinary incontinence, urinary frequency, nocturnal urination.  MS: Negative for joint pain, low back pain.  Derm: Negative for rash or itching.  Neuro: Negative for weakness, abnormal sensation, seizure, frequent headaches, memory loss, confusion.  Psych:  Negative for anxiety, depression, suicidal ideation, hallucinations.  Endo: Negative for unusual weight change.  Heme: Negative for bruising or bleeding. Allergy: Negative for rash or hives.    Physical Examination:  BP 168/80 mmHg  Pulse 80  Temp(Src) 97 F (36.1 C) (Oral)  Ht 5\' 11"  (1.803 m)  Wt 182 lb 6.4 oz (82.736 kg)  BMI 25.45 kg/m2   General: Well-nourished, well-developed in no acute distress.  Head: Normocephalic, atraumatic.   Eyes: Conjunctiva pink, no icterus. Mouth: Oropharyngeal mucosa moist and pink , no lesions erythema or exudate. Neck: Supple without thyromegaly, masses, or lymphadenopathy.  Lungs: Clear to auscultation bilaterally.  Heart: Regular rate and rhythm, no murmurs rubs or gallops.  Abdomen: Bowel sounds are normal, nontender, nondistended, no hepatosplenomegaly or masses, no abdominal bruits or    hernia , no rebound or guarding.   Rectal: Deferred Extremities: No lower extremity edema. No clubbing or deformities.  Neuro: Alert and oriented x 4 , grossly normal neurologically.  Skin: Warm and dry, no rash or jaundice.   Psych: Alert and cooperative, normal mood and affect.

## 2014-11-25 NOTE — Discharge Instructions (Addendum)
Colonoscopy Discharge Instructions  Read the instructions outlined below and refer to this sheet in the next few weeks. These discharge instructions provide you with general information on caring for yourself after you leave the hospital. Your doctor may also give you specific instructions. While your treatment has been planned according to the most current medical practices available, unavoidable complications occasionally occur. If you have any problems or questions after discharge, call Dr. Gala Romney at [phone]. ACTIVITY  You may resume your regular activity, but move at a slower pace for the next 24 hours.   Take frequent rest periods for the next 24 hours.   Walking will help get rid of the air and reduce the bloated feeling in your belly (abdomen).   No driving for 24 hours (because of the medicine (anesthesia) used during the test).    Do not sign any important legal documents or operate any machinery for 24 hours (because of the anesthesia used during the test).  NUTRITION  Drink plenty of fluids.   You may resume your normal diet as instructed by your doctor.   Begin with a light meal and progress to your normal diet. Heavy or fried foods are harder to digest and may make you feel sick to your stomach (nauseated).   Avoid alcoholic beverages for 24 hours or as instructed.  MEDICATIONS  You may resume your normal medications unless your doctor tells you otherwise.  WHAT YOU CAN EXPECT TODAY  Some feelings of bloating in the abdomen.   Passage of more gas than usual.   Spotting of blood in your stool or on the toilet paper.  IF YOU HAD POLYPS REMOVED DURING THE COLONOSCOPY:  No aspirin products for 7 days or as instructed.   No alcohol for 7 days or as instructed.   Eat a soft diet for the next 24 hours.  FINDING OUT THE RESULTS OF YOUR TEST Not all test results are available during your visit. If your test results are not back during the visit, make an appointment  with your caregiver to find out the results. Do not assume everything is normal if you have not heard from your caregiver or the medical facility. It is important for you to follow up on all of your test results.  SEEK IMMEDIATE MEDICAL ATTENTION IF:  You have more than a spotting of blood in your stool.   Your belly is swollen (abdominal distention).   You are nauseated or vomiting.   You have a temperature over 101.  You have abdominal pain or discomfort that is severe or gets worse throughout the day. EGD Discharge instructions Please read the instructions outlined below and refer to this sheet in the next few weeks. These discharge instructions provide you with general information on caring for yourself after you leave the hospital. Your doctor may also give you specific instructions. While your treatment has been planned according to the most current medical practices available, unavoidable complications occasionally occur. If you have any problems or questions after discharge, please call your doctor. ACTIVITY You may resume your regular activity but move at a slower pace for the next 24 hours.  Take frequent rest periods for the next 24 hours.  Walking will help expel (get rid of) the air and reduce the bloated feeling in your abdomen.  No driving for 24 hours (because of the anesthesia (medicine) used during the test).  You may shower.  Do not sign any important legal documents or operate any machinery for 24  hours (because of the anesthesia used during the test).  NUTRITION Drink plenty of fluids.  You may resume your normal diet.  Begin with a light meal and progress to your normal diet.  Avoid alcoholic beverages for 24 hours or as instructed by your caregiver.  MEDICATIONS You may resume your normal medications unless your caregiver tells you otherwise.  WHAT YOU CAN EXPECT TODAY You may experience abdominal discomfort such as a feeling of fullness or gas pains.   FOLLOW-UP Your doctor will discuss the results of your test with you.  SEEK IMMEDIATE MEDICAL ATTENTION IF ANY OF THE FOLLOWING OCCUR: Excessive nausea (feeling sick to your stomach) and/or vomiting.  Severe abdominal pain and distention (swelling).  Trouble swallowing.  Temperature over 101 F (37.8 C).  Rectal bleeding or vomiting of blood.     GERD, diverticulosis and colon polyp information provided  Begin Protonix 40 mg daily  Further recommendations to follow pending review of pathology report  Gastroesophageal Reflux Disease, Adult Gastroesophageal reflux disease (GERD) happens when acid from your stomach flows up into the esophagus. When acid comes in contact with the esophagus, the acid causes soreness (inflammation) in the esophagus. Over time, GERD may create small holes (ulcers) in the lining of the esophagus. CAUSES   Increased body weight. This puts pressure on the stomach, making acid rise from the stomach into the esophagus.  Smoking. This increases acid production in the stomach.  Drinking alcohol. This causes decreased pressure in the lower esophageal sphincter (valve or ring of muscle between the esophagus and stomach), allowing acid from the stomach into the esophagus.  Late evening meals and a full stomach. This increases pressure and acid production in the stomach.  A malformed lower esophageal sphincter. Sometimes, no cause is found. SYMPTOMS   Burning pain in the lower part of the mid-chest behind the breastbone and in the mid-stomach area. This may occur twice a week or more often.  Trouble swallowing.  Sore throat.  Dry cough.  Asthma-like symptoms including chest tightness, shortness of breath, or wheezing. DIAGNOSIS  Your caregiver may be able to diagnose GERD based on your symptoms. In some cases, X-rays and other tests may be done to check for complications or to check the condition of your stomach and esophagus. TREATMENT  Your caregiver  may recommend over-the-counter or prescription medicines to help decrease acid production. Ask your caregiver before starting or adding any new medicines.  HOME CARE INSTRUCTIONS   Change the factors that you can control. Ask your caregiver for guidance concerning weight loss, quitting smoking, and alcohol consumption.  Avoid foods and drinks that make your symptoms worse, such as:  Caffeine or alcoholic drinks.  Chocolate.  Peppermint or mint flavorings.  Garlic and onions.  Spicy foods.  Citrus fruits, such as oranges, lemons, or limes.  Tomato-based foods such as sauce, chili, salsa, and pizza.  Fried and fatty foods.  Avoid lying down for the 3 hours prior to your bedtime or prior to taking a nap.  Eat small, frequent meals instead of large meals.  Wear loose-fitting clothing. Do not wear anything tight around your waist that causes pressure on your stomach.  Raise the head of your bed 6 to 8 inches with wood blocks to help you sleep. Extra pillows will not help.  Only take over-the-counter or prescription medicines for pain, discomfort, or fever as directed by your caregiver.  Do not take aspirin, ibuprofen, or other nonsteroidal anti-inflammatory drugs (NSAIDs). SEEK IMMEDIATE MEDICAL CARE IF:  You have pain in your arms, neck, jaw, teeth, or back.  Your pain increases or changes in intensity or duration.  You develop nausea, vomiting, or sweating (diaphoresis).  You develop shortness of breath, or you faint.  Your vomit is green, yellow, black, or looks like coffee grounds or blood.  Your stool is red, bloody, or black. These symptoms could be signs of other problems, such as heart disease, gastric bleeding, or esophageal bleeding. MAKE SURE YOU:   Understand these instructions.  Will watch your condition.  Will get help right away if you are not doing well or get worse.   Diverticulosis Diverticulosis is the condition that develops when small pouches  (diverticula) form in the wall of your colon. Your colon, or large intestine, is where water is absorbed and stool is formed. The pouches form when the inside layer of your colon pushes through weak spots in the outer layers of your colon. CAUSES  No one knows exactly what causes diverticulosis. RISK FACTORS  Being older than 90. Your risk for this condition increases with age. Diverticulosis is rare in people younger than 40 years. By age 56, almost everyone has it.  Eating a low-fiber diet.  Being frequently constipated.  Being overweight.  Not getting enough exercise.  Smoking.  Taking over-the-counter pain medicines, like aspirin and ibuprofen. SYMPTOMS  Most people with diverticulosis do not have symptoms. DIAGNOSIS  Because diverticulosis often has no symptoms, health care providers often discover the condition during an exam for other colon problems. In many cases, a health care provider will diagnose diverticulosis while using a flexible scope to examine the colon (colonoscopy). TREATMENT  If you have never developed an infection related to diverticulosis, you may not need treatment. If you have had an infection before, treatment may include:  Eating more fruits, vegetables, and grains.  Taking a fiber supplement.  Taking a live bacteria supplement (probiotic).  Taking medicine to relax your colon. HOME CARE INSTRUCTIONS   Drink at least 6-8 glasses of water each day to prevent constipation.  Try not to strain when you have a bowel movement.  Keep all follow-up appointments. If you have had an infection before:  Increase the fiber in your diet as directed by your health care provider or dietitian.  Take a dietary fiber supplement if your health care provider approves.  Only take medicines as directed by your health care provider. SEEK MEDICAL CARE IF:   You have abdominal pain.  You have bloating.  You have cramps.  You have not gone to the bathroom in 3  days. SEEK IMMEDIATE MEDICAL CARE IF:   Your pain gets worse.  Yourbloating becomes very bad.  You have a fever or chills, and your symptoms suddenly get worse.  You begin vomiting.  You have bowel movements that are bloody or black. MAKE SURE YOU:  Understand these instructions.  Will watch your condition.  Will get help right away if you are not doing well or get worse. Colon Polyps Polyps are lumps of extra tissue growing inside the body. Polyps can grow in the large intestine (colon). Most colon polyps are noncancerous (benign). However, some colon polyps can become cancerous over time. Polyps that are larger than a pea may be harmful. To be safe, caregivers remove and test all polyps. CAUSES  Polyps form when mutations in the genes cause your cells to grow and divide even though no more tissue is needed. RISK FACTORS There are a number of risk factors that  can increase your chances of getting colon polyps. They include:  Being older than 50 years.  Family history of colon polyps or colon cancer.  Long-term colon diseases, such as colitis or Crohn disease.  Being overweight.  Smoking.  Being inactive.  Drinking too much alcohol. SYMPTOMS  Most small polyps do not cause symptoms. If symptoms are present, they may include:  Blood in the stool. The stool may look dark red or black.  Constipation or diarrhea that lasts longer than 1 week. DIAGNOSIS People often do not know they have polyps until their caregiver finds them during a regular checkup. Your caregiver can use 4 tests to check for polyps:  Digital rectal exam. The caregiver wears gloves and feels inside the rectum. This test would find polyps only in the rectum.  Barium enema. The caregiver puts a liquid called barium into your rectum before taking X-rays of your colon. Barium makes your colon look white. Polyps are dark, so they are easy to see in the X-ray pictures.  Sigmoidoscopy. A thin, flexible  tube (sigmoidoscope) is placed into your rectum. The sigmoidoscope has a light and tiny camera in it. The caregiver uses the sigmoidoscope to look at the last third of your colon.  Colonoscopy. This test is like sigmoidoscopy, but the caregiver looks at the entire colon. This is the most common method for finding and removing polyps. TREATMENT  Any polyps will be removed during a sigmoidoscopy or colonoscopy. The polyps are then tested for cancer. PREVENTION  To help lower your risk of getting more colon polyps:  Eat plenty of fruits and vegetables. Avoid eating fatty foods.  Do not smoke.  Avoid drinking alcohol.  Exercise every day.  Lose weight if recommended by your caregiver.  Eat plenty of calcium and folate. Foods that are rich in calcium include milk, cheese, and broccoli. Foods that are rich in folate include chickpeas, kidney beans, and spinach. HOME CARE INSTRUCTIONS Keep all follow-up appointments as directed by your caregiver. You may need periodic exams to check for polyps. SEEK MEDICAL CARE IF: You notice bleeding during a bowel movement.

## 2014-11-25 NOTE — Interval H&P Note (Signed)
History and Physical Interval Note:  11/25/2014 9:55 AM  Jeremy Patton  has presented today for surgery, with the diagnosis of GERD, Dysphagia, family history of colon cancer  The various methods of treatment have been discussed with the patient and family. After consideration of risks, benefits and other options for treatment, the patient has consented to  Procedure(s) with comments: COLONOSCOPY (N/A) - 1030 ESOPHAGOGASTRODUODENOSCOPY (EGD) (N/A) ESOPHAGEAL DILATION (N/A) as a surgical intervention .  The patient's history has been reviewed, patient examined, no change in status, stable for surgery.  I have reviewed the patient's chart and labs.  Questions were answered to the patient's satisfaction.     Jeremy Patton  No change. EGD with possible esophageal dilation and colonoscopy per plan.  The risks, benefits, limitations, imponderables and alternatives regarding both EGD and colonoscopy have been reviewed with the patient. Questions have been answered. All parties agreeable.

## 2014-11-25 NOTE — Op Note (Signed)
[facility] [address] [place], [address]   ENDOSCOPY PROCEDURE REPORT  PATIENT: Jeremy Patton, Jeremy Patton  MR#: [id number] BIRTHDATE: 1940/07/13 , 28  yrs. old GENDER: male ENDOSCOPIST: R.  Garfield Cornea, MD FACP FACG REFERRED BY:  Asencion Noble, M.D. PROCEDURE DATE:  Dec 25, 2014 PROCEDURE:  EGD w/ biopsy and Maloney dilation of esophagus INDICATIONS:  Long-standing GERD; esophageal dysphagia. MEDICATIONS: Versed 5 mg IV and Demerol 75 mg IV in divided doses. Xylocaine gel orally.  Zofran 4 mg IV. ASA CLASS:      Class II  CONSENT: The risks, benefits, limitations, alternatives and imponderables have been discussed.  The potential for biopsy, esophogeal dilation, etc. have also been reviewed.  Questions have been answered.  All parties agreeable.  Please see the history and physical in the medical record for more information.  DESCRIPTION OF PROCEDURE: After the risks benefits and alternatives of the procedure were thoroughly explained, informed consent was obtained.  The EG-[id number] ([id number]) endoscope was introduced through the mouth and advanced to the second portion of the duodenum , limited by Without limitations. The instrument was slowly withdrawn as the mucosa was fully examined. Estimated blood loss is zero unless otherwise noted in this procedure report.    The patient had salmon-colored epithelium coming up 2 cm circumferentially from the GE junction with 3 "tongues" coming up another 2 cm into the distal esophagus.  At the leading edge of one of these "tongues" there was a 5 mm x 3 mm ulceration.  No nodularity.  The esophagus did appear patent throughout its course. Stomach empty.  3 cm hiatal hernia.  Antral erosions present.  No ulcer or infiltrating process.  Patent pylorus.  Examination of bulb and second portion revealed bulbar erosions only.  A 56 French Maloney dilator was  passed to full insertion easily.  A look back revealed some trauma at the  GE junction ( approximately 5 mL of blood loss).  No complications seen.  however.  Subsequently, biopsies of the abnormal antrum and distal esophagus were taken separately.  Estimated total blood loss 8 mL. total  Retroflexed views revealed as previously described.     The scope was then withdrawn from the patient and the procedure completed.  COMPLICATIONS: There were no immediate complications.  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  Hiatal hernia. Gastric and duodenal erosions?"status post gastric biopsy  RECOMMENDATIONS: Begin Protonix 40 mg daily. Follow-up on pathology.  REPEAT EXAM:  eSigned:  R. Garfield Cornea, MD [facility] 12/25/2014 10:29 AM    CC:  CPT CODES: ICD CODES:  The ICD and CPT codes recommended by this software are interpretations from the data that the clinical staff has captured with the software.  The verification of the translation of this report to the ICD and CPT codes and modifiers is the sole responsibility of the health care institution and practicing physician where this report was generated.  [employer] will not be held responsible for the validity of the ICD and CPT codes included on this report.  AMA assumes no liability for data contained or not contained herein. CPT is a Designer, television/film set of the [employer].  PATIENT NAME:  Jeremy Patton, Jeremy Patton MR#: [id number]

## 2014-11-28 ENCOUNTER — Encounter (HOSPITAL_COMMUNITY): Payer: Self-pay | Admitting: Internal Medicine

## 2014-12-02 ENCOUNTER — Encounter: Payer: Self-pay | Admitting: Internal Medicine

## 2014-12-03 ENCOUNTER — Telehealth: Payer: Self-pay

## 2014-12-03 NOTE — Telephone Encounter (Signed)
Per RMR- Send letter to patient.  Send copy of letter with path to referring provider and PCP.   OV in one 1 year with extender to set of repeat EGD

## 2014-12-03 NOTE — Telephone Encounter (Signed)
ON RECALL LIST  °

## 2014-12-03 NOTE — Telephone Encounter (Signed)
Letter mailed to the pt.  Please nic.  

## 2014-12-31 ENCOUNTER — Encounter: Payer: Self-pay | Admitting: Vascular Surgery

## 2015-01-03 ENCOUNTER — Ambulatory Visit: Payer: Medicare PPO | Admitting: Family

## 2015-01-03 ENCOUNTER — Encounter (HOSPITAL_COMMUNITY): Payer: Medicare PPO

## 2015-04-17 ENCOUNTER — Encounter: Payer: Self-pay | Admitting: Family

## 2015-04-24 ENCOUNTER — Other Ambulatory Visit: Payer: Self-pay | Admitting: *Deleted

## 2015-04-24 DIAGNOSIS — I6523 Occlusion and stenosis of bilateral carotid arteries: Secondary | ICD-10-CM

## 2015-04-25 ENCOUNTER — Encounter: Payer: Self-pay | Admitting: Family

## 2015-04-25 ENCOUNTER — Ambulatory Visit (INDEPENDENT_AMBULATORY_CARE_PROVIDER_SITE_OTHER): Payer: Medicare Other | Admitting: Family

## 2015-04-25 ENCOUNTER — Telehealth: Payer: Self-pay | Admitting: Vascular Surgery

## 2015-04-25 ENCOUNTER — Ambulatory Visit (HOSPITAL_COMMUNITY)
Admission: RE | Admit: 2015-04-25 | Discharge: 2015-04-25 | Disposition: A | Discharge status: Home / Self Care | Payer: Medicare Other | Source: Ambulatory Visit | Attending: Family | Admitting: Family

## 2015-04-25 ENCOUNTER — Other Ambulatory Visit: Payer: Self-pay | Admitting: Vascular Surgery

## 2015-04-25 VITALS — BP 118/63 | HR 71 | Ht 71.0 in | Wt 185.4 lb

## 2015-04-25 DIAGNOSIS — I10 Essential (primary) hypertension: Secondary | ICD-10-CM | POA: Diagnosis not present

## 2015-04-25 DIAGNOSIS — E119 Type 2 diabetes mellitus without complications: Secondary | ICD-10-CM | POA: Insufficient documentation

## 2015-04-25 DIAGNOSIS — I6523 Occlusion and stenosis of bilateral carotid arteries: Secondary | ICD-10-CM | POA: Insufficient documentation

## 2015-04-25 DIAGNOSIS — E785 Hyperlipidemia, unspecified: Secondary | ICD-10-CM | POA: Insufficient documentation

## 2015-04-25 LAB — BUN: BUN: 26 mg/dL — ABNORMAL HIGH (ref 7–25)

## 2015-04-25 LAB — CREATININE, SERUM: Creat: 1.31 mg/dL — ABNORMAL HIGH (ref 0.70–1.18)

## 2015-04-25 NOTE — Telephone Encounter (Signed)
LM for pt with appt information on cell # [phone].  His CTA is scheduled with Nelda Severe on Monday March 6th @ 8:00am.  No solid foods 4 hours prior, liquids and meds are OK.

## 2015-04-25 NOTE — Progress Notes (Signed)
Chief Complaint: Extracranial Carotid Artery Stenosis   History of Present Illness  Jeremy Patton is a 75 y.o. male patient of Dr. 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 Previous carotid studies demonstrated: RICA [id number] stenosis, LICA 123456 stenosis. Patient has no history of TIA or stroke symptom. The patient has never had amaurosis fugax or monocular blindness. The patient has never had facial drooping or hemiplegia. The patient has never had receptive or expressive aphasia.  Pt states dizziness mostly occurs if he stands up too fast from sitting; also occurs if he turns too fast.    He walks his dog 30-45 minutes daily and lifts weights daily.   Patient has not had previous carotid artery intervention.  The patient denies any history of TIA or stroke symptoms, specifically the patient denies a history of amaurosis fugax or monocular blindness, denies a history unilateral  of facial drooping, denies a history of hemiplegia, and denies a history of receptive or expressive aphasia.    The patient reports rhinorrhea for about 3 weeks, denies dyspnea, denies cough but he has an occasional cough at this time.  Pt Diabetic: yes, states last A1C was 8.3, pt states his A1C in the past was in the teens Pt smoker: former smoker, quit in the 1980's  Pt meds include: Statin : yes ASA: yes Other anticoagulants/antiplatelets: no   Past Medical History  Diagnosis Date  . Diabetes mellitus without complication ([place]) [id number]  . Hyperlipidemia 11/29/2011  . Low back pain 11/29/2011  . Lumbar radiculopathy 11/29/2011    Left  . Hypertension 11/29/2011  . Pain of left lower leg   . Carotid artery occlusion     Bruit, Right    Social History Social History  Substance Use Topics  . Smoking status: Former Smoker -- 30 years    Types: Cigarettes    Quit date: 12/17/1991  . Smokeless tobacco: Former Systems developer    Quit date: 12/17/1986  .  Alcohol Use: No    Family History Family History  Problem Relation Age of Onset  . Colon cancer Brother     Age 40  . Stroke Mother   . Heart disease Mother     before age 74    Surgical History Past Surgical History  Procedure Laterality Date  . Colonoscopy  08/22/2005 and  07/31/2009    minimal internal hemorrhoids, left-sided diverticulosis  . Tonsillectomy    . Circumcision      Age 78  . Colonoscopy N/A 11/25/2014    Procedure: COLONOSCOPY;  Surgeon: Daneil Dolin, MD;  [facility];  Service: Endoscopy;  Laterality: N/A;  1030  . Esophagogastroduodenoscopy N/A 11/25/2014    Procedure: ESOPHAGOGASTRODUODENOSCOPY (EGD);  Surgeon: Daneil Dolin, MD;  [facility];  Service: Endoscopy;  Laterality: N/A;  . Esophageal dilation N/A 11/25/2014    Procedure: ESOPHAGEAL DILATION;  Surgeon: Daneil Dolin, MD;  [facility];  Service: Endoscopy;  Laterality: N/A;    No Known Allergies  Current Outpatient Prescriptions  Medication Sig Dispense Refill  . acetaminophen (TYLENOL) 500 MG tablet Take 1,000 mg by mouth every 6 (six) hours as needed for moderate pain.    Marland Kitchen aspirin 81 MG tablet Take 81 mg by mouth daily.    . chlorproMAZINE (THORAZINE) 25 MG tablet Take 25 mg by mouth every 6 (six) hours.    . gabapentin (NEURONTIN) 300 MG capsule 300 mg 3 (three) times daily.     Marland Kitchen  Insulin Aspart (NOVOLOG FLEXPEN [place]) Inject into the skin.    Marland Kitchen LANTUS 100 UNIT/ML injection Inject 30 Units into the skin every morning.     Marland Kitchen lisinopril-hydrochlorothiazide (PRINZIDE,ZESTORETIC) 20-12.5 MG per tablet Take 2 tablets by mouth daily.     . metFORMIN (GLUCOPHAGE) 1000 MG tablet Take 1,000 mg by mouth 2 (two) times daily with a meal.     . pantoprazole (PROTONIX) 40 MG tablet     . pioglitazone (ACTOS) 15 MG tablet     . polyethylene glycol-electrolytes (NULYTELY/GOLYTELY) 420 G solution Take 4,000 mLs by mouth once. 4000 mL 0  . simvastatin (ZOCOR) 10 MG tablet Take  10 mg by mouth at bedtime.    Marland Kitchen spironolactone (ALDACTONE) 25 MG tablet Take 12.5 mg by mouth daily.     . Tamsulosin HCl (FLOMAX) 0.4 MG CAPS Take 0.4 mg by mouth daily.     . verapamil (CALAN-SR) 180 MG CR tablet Take 180 mg by mouth 2 (two) times daily.    Marland Kitchen HUMALOG 100 UNIT/ML injection Inject 3 Units into the skin every morning. Reported on 04/25/2015     No current facility-administered medications for this visit.    Review of Systems : See HPI for pertinent positives and negatives.  Physical Examination  Filed Vitals:   04/25/15 1112 04/25/15 1114  BP: 121/59 118/63  Pulse: 71   Height: 5\' 11"  (1.803 m)   Weight: 185 lb 6.4 oz (84.097 kg)   SpO2: 99%    Body mass index is 25.87 kg/(m^2).  General: A&O x 3, WDWN, fit appearing male  Eyes: PERRLA  Neck: Supple, no nuchal rigidity  Pulmonary: Sym exp, + rales in right posterior lower fields, no rhonchi or wheezing  Cardiac: RRR, Nl S1, S2, no detected murmur  Vascular: Vessel Right Left  Radial Palpable Palpable  Brachial Palpable Palpable  Carotid Palpable, without bruit Palpable, without bruit  Aorta Not palpable N/A  Femoral Palpable Palpable  Popliteal Not palpable Not palpable  PT Faintly Palpable Faintly Palpable  DP Faintly Palpable Faintly Palpable   Gastrointestinal: soft, NTND, -G/R, - HSM, - palpable masses, - CVAT B  Musculoskeletal: M/S 5/5 throughout , Extremities without ischemic changes   Neurologic: CN 2-12 intact , Pain and light touch intact in extremities , Motor exam as listed above         Non-Invasive Vascular Imaging CAROTID DUPLEX 04/25/2015   Right ICA: 80 - 99 % stenosis, suspect near occlusion due to minimal diastolic flow in the common carotid artery and markedly dampened waveforms in the distal internal carotid artery. Plaque is calcific and cannot be thoroughly evaluated.  Right ECA stenosis of >50% Left ICA: 60 - 79 % stenosis. Bilateral vertebral  artery is antegrade.  Disease progression of both internal carotid arteries compared to the exam of 11/16/13.   Assessment: Jeremy Patton is a 75 y.o. male who has no history of stroke or TIA.  Today's carotid duplex suggests 80 - 99 % right internal carotid artery stenosis, suspect near occlusion due to minimal diastolic flow in the common carotid artery and markedly dampened waveforms in the distal internal carotid artery. Plaque is calcific and cannot be thoroughly evaluated.  Left ICA: stenosis of 60 - 79 % Disease progression of both internal carotid arteries compared to the exam of 11/16/13.   I discussed with Dr. Bridgett Larsson pt HPI and carotid duplex results.  I advised pt to see his PCP as soon as possible re the crackles auscultated in  his posterior right lung fields, his cough, and rhinorrhea.  Pt reports transient orthostatic light-headedness.  His atherosclerotic risk factors include uncontrolled DM and former smoker (quit in the 1980's).  Fortunately he has no known cardiac problems, he exercises daily.   Face to face time with patient was 25 minutes. Over 50% of this time was spent on counseling and coordination of care.   Plan:  CTA neck ASAP to determine if right ICA is occluded or severely stenotic, follow up with Dr. Bridgett Larsson for discussion of results and options. Cardiology consult for risk stratification, contemplating right CEA.    I discussed in depth with the patient the nature of atherosclerosis, and emphasized the importance of maximal medical management including strict control of blood pressure, blood glucose, and lipid levels, obtaining regular exercise, and continued cessation of smoking.  The patient is aware that without maximal medical management the underlying atherosclerotic disease process will progress, limiting the benefit of any interventions. The patient was given information about stroke prevention and what symptoms should prompt the patient to seek  immediate medical care. Thank you for allowing [place] to participate in this patient's care.  Clemon Chambers, RN, MSN, FNP-C [facility] Office: [phone]  Clinic Physician: Bridgett Larsson  04/25/2015 11:22 AM

## 2015-04-25 NOTE — Patient Instructions (Signed)
Stroke Prevention Some medical conditions and behaviors are associated with an increased chance of having a stroke. You may prevent a stroke by making healthy choices and managing medical conditions. HOW CAN I REDUCE MY RISK OF HAVING A STROKE?   Stay physically active. Get at least 30 minutes of activity on most or all days.  Do not smoke. It may also be helpful to avoid exposure to secondhand smoke.  Limit alcohol use. Moderate alcohol use is considered to be:  No more than 2 drinks per day for men.  No more than 1 drink per day for nonpregnant women.  Eat healthy foods. This involves:  Eating 5 or more servings of fruits and vegetables a day.  Making dietary changes that address high blood pressure (hypertension), high cholesterol, diabetes, or obesity.  Manage your cholesterol levels.  Making food choices that are high in fiber and low in saturated fat, trans fat, and cholesterol may control cholesterol levels.  Take any prescribed medicines to control cholesterol as directed by your health care provider.  Manage your diabetes.  Controlling your carbohydrate and sugar intake is recommended to manage diabetes.  Take any prescribed medicines to control diabetes as directed by your health care provider.  Control your hypertension.  Making food choices that are low in salt (sodium), saturated fat, trans fat, and cholesterol is recommended to manage hypertension.  Ask your health care provider if you need treatment to lower your blood pressure. Take any prescribed medicines to control hypertension as directed by your health care provider.  If you are 18-39 years of age, have your blood pressure checked every 3-5 years. If you are 40 years of age or older, have your blood pressure checked every year.  Maintain a healthy weight.  Reducing calorie intake and making food choices that are low in sodium, saturated fat, trans fat, and cholesterol are recommended to manage  weight.  Stop drug abuse.  Avoid taking birth control pills.  Talk to your health care provider about the risks of taking birth control pills if you are over 35 years old, smoke, get migraines, or have ever had a blood clot.  Get evaluated for sleep disorders (sleep apnea).  Talk to your health care provider about getting a sleep evaluation if you snore a lot or have excessive sleepiness.  Take medicines only as directed by your health care provider.  For some people, aspirin or blood thinners (anticoagulants) are helpful in reducing the risk of forming abnormal blood clots that can lead to stroke. If you have the irregular heart rhythm of atrial fibrillation, you should be on a blood thinner unless there is a good reason you cannot take them.  Understand all your medicine instructions.  Make sure that other conditions (such as anemia or atherosclerosis) are addressed. SEEK IMMEDIATE MEDICAL CARE IF:   You have sudden weakness or numbness of the face, arm, or leg, especially on one side of the body.  Your face or eyelid droops to one side.  You have sudden confusion.  You have trouble speaking (aphasia) or understanding.  You have sudden trouble seeing in one or both eyes.  You have sudden trouble walking.  You have dizziness.  You have a loss of balance or coordination.  You have a sudden, severe headache with no known cause.  You have new chest pain or an irregular heartbeat. Any of these symptoms may represent a serious problem that is an emergency. Do not wait to see if the symptoms will   go away. Get medical help at once. Call your local emergency services (911 in [place] Do not drive yourself to the hospital.   This information is not intended to replace advice given to you by your health care provider. Make sure you discuss any questions you have with your health care provider.   Document Released: 03/18/2004 Document Revised: 03/01/2014 Document Reviewed:  08/11/2012 Elsevier Interactive Patient Education 2016 Elsevier Inc.  

## 2015-04-28 ENCOUNTER — Encounter: Payer: Self-pay | Admitting: Vascular Surgery

## 2015-04-28 ENCOUNTER — Ambulatory Visit
Admission: RE | Admit: 2015-04-28 | Discharge: 2015-04-28 | Disposition: A | Discharge status: Home / Self Care | Payer: Medicare Other | Source: Ambulatory Visit | Attending: Family | Admitting: Family

## 2015-04-28 ENCOUNTER — Other Ambulatory Visit: Payer: Self-pay | Admitting: *Deleted

## 2015-04-28 ENCOUNTER — Telehealth: Payer: Self-pay | Admitting: Vascular Surgery

## 2015-04-28 DIAGNOSIS — I6523 Occlusion and stenosis of bilateral carotid arteries: Secondary | ICD-10-CM

## 2015-04-28 DIAGNOSIS — Z01810 Encounter for preprocedural cardiovascular examination: Secondary | ICD-10-CM

## 2015-04-28 MED ORDER — IOPAMIDOL (ISOVUE-370) INJECTION 76%
100.0000 mL | Freq: Once | INTRAVENOUS | Status: AC | PRN
  Administered 2015-04-28: 100 mL via INTRAVENOUS

## 2015-04-28 NOTE — Telephone Encounter (Signed)
Patient is already scheduled for Tuesday 03/07 for cardiology appt, dpm

## 2015-04-28 NOTE — Telephone Encounter (Signed)
-----   Message from Mena Goes, RN sent at 04/28/2015  2:06 PM EST ----- Regarding: schedule   ----- Message -----    From: Conrad Kensington, MD    Sent: 04/28/2015  11:06 AM      To: [address]  Jeremy Patton [id number] 16-Nov-1940  Pt will need ASAP cardiology evaluation for possible carotid intervention if cardiology evaluation not already set up

## 2015-04-29 ENCOUNTER — Ambulatory Visit (INDEPENDENT_AMBULATORY_CARE_PROVIDER_SITE_OTHER): Payer: Medicare Other | Admitting: Cardiovascular Disease

## 2015-04-29 ENCOUNTER — Encounter: Payer: Self-pay | Admitting: Cardiovascular Disease

## 2015-04-29 VITALS — BP 110/60 | HR 79 | Ht 71.0 in | Wt 184.4 lb

## 2015-04-29 DIAGNOSIS — I6523 Occlusion and stenosis of bilateral carotid arteries: Secondary | ICD-10-CM

## 2015-04-29 DIAGNOSIS — I10 Essential (primary) hypertension: Secondary | ICD-10-CM | POA: Diagnosis not present

## 2015-04-29 DIAGNOSIS — I779 Disorder of arteries and arterioles, unspecified: Secondary | ICD-10-CM

## 2015-04-29 DIAGNOSIS — Z01818 Encounter for other preprocedural examination: Secondary | ICD-10-CM | POA: Diagnosis not present

## 2015-04-29 DIAGNOSIS — I739 Peripheral vascular disease, unspecified: Secondary | ICD-10-CM

## 2015-04-29 DIAGNOSIS — E785 Hyperlipidemia, unspecified: Secondary | ICD-10-CM | POA: Diagnosis not present

## 2015-04-29 DIAGNOSIS — R42 Dizziness and giddiness: Secondary | ICD-10-CM

## 2015-04-29 DIAGNOSIS — E1101 Type 2 diabetes mellitus with hyperosmolarity with coma: Secondary | ICD-10-CM

## 2015-04-29 MED ORDER — ROSUVASTATIN CALCIUM 20 MG PO TABS: 20.0000 mg | ORAL_TABLET | Freq: Every day | ORAL | Status: DC

## 2015-04-29 NOTE — Patient Instructions (Addendum)
Your physician has requested that you have an echocardiogram. Echocardiography is a painless test that uses sound waves to create images of your heart. It provides your doctor with information about the size and shape of your heart and how well your heart's chambers and valves are working. This procedure takes approximately one hour. There are no restrictions for this procedure.  Your physician has requested that you have a lexiscan myoview. For further information please visit [email or web address]. Please follow instruction sheet, as given.  Your physician recommends that you return for lab work  Your physician has recommended you make the following change in your medication:   1.) STOP the spironolactone.  2.) STOP the simvastatin. This has been replaced with generic crestor 20 mg   Your physician recommends that you schedule a follow-up work in appointment next week.

## 2015-04-30 ENCOUNTER — Other Ambulatory Visit: Payer: Self-pay | Admitting: Cardiovascular Disease

## 2015-04-30 ENCOUNTER — Telehealth (HOSPITAL_COMMUNITY): Payer: Self-pay | Admitting: *Deleted

## 2015-04-30 NOTE — Telephone Encounter (Signed)
Patient given detailed instructions per Myocardial Perfusion Study Information Sheet for the test on 05/01/15. Patient notified to arrive 15 minutes early and that it is imperative to arrive on time for appointment to keep from having the test rescheduled.  If you need to cancel or reschedule your appointment, please call the office within 24 hours of your appointment. Failure to do so may result in a cancellation of your appointment, and a $50 no show fee. Patient verbalized understanding.Hubbard Robinson, RN

## 2015-05-01 ENCOUNTER — Ambulatory Visit (HOSPITAL_BASED_OUTPATIENT_CLINIC_OR_DEPARTMENT_OTHER): Payer: Medicare Other

## 2015-05-01 ENCOUNTER — Ambulatory Visit (INDEPENDENT_AMBULATORY_CARE_PROVIDER_SITE_OTHER): Payer: Medicare Other | Admitting: Vascular Surgery

## 2015-05-01 ENCOUNTER — Encounter: Payer: Self-pay | Admitting: Vascular Surgery

## 2015-05-01 ENCOUNTER — Other Ambulatory Visit: Payer: Self-pay | Admitting: Cardiovascular Disease

## 2015-05-01 ENCOUNTER — Encounter: Payer: Self-pay | Admitting: Cardiovascular Disease

## 2015-05-01 ENCOUNTER — Ambulatory Visit (HOSPITAL_COMMUNITY)
Admission: RE | Admit: 2015-05-01 | Discharge: 2015-05-01 | Disposition: A | Discharge status: Home / Self Care | Payer: Medicare Other | Source: Ambulatory Visit | Attending: Cardiology | Admitting: Cardiology

## 2015-05-01 ENCOUNTER — Other Ambulatory Visit: Payer: Self-pay

## 2015-05-01 VITALS — BP 117/60 | HR 81 | Temp 97.4°F | Resp 18 | Ht 71.0 in | Wt 185.6 lb

## 2015-05-01 DIAGNOSIS — E109 Type 1 diabetes mellitus without complications: Secondary | ICD-10-CM | POA: Diagnosis not present

## 2015-05-01 DIAGNOSIS — E785 Hyperlipidemia, unspecified: Secondary | ICD-10-CM | POA: Diagnosis not present

## 2015-05-01 DIAGNOSIS — I10 Essential (primary) hypertension: Secondary | ICD-10-CM

## 2015-05-01 DIAGNOSIS — Z01818 Encounter for other preprocedural examination: Secondary | ICD-10-CM

## 2015-05-01 DIAGNOSIS — I119 Hypertensive heart disease without heart failure: Secondary | ICD-10-CM | POA: Diagnosis not present

## 2015-05-01 DIAGNOSIS — R42 Dizziness and giddiness: Secondary | ICD-10-CM | POA: Insufficient documentation

## 2015-05-01 DIAGNOSIS — Z794 Long term (current) use of insulin: Secondary | ICD-10-CM | POA: Insufficient documentation

## 2015-05-01 DIAGNOSIS — I779 Disorder of arteries and arterioles, unspecified: Secondary | ICD-10-CM

## 2015-05-01 DIAGNOSIS — I2510 Atherosclerotic heart disease of native coronary artery without angina pectoris: Secondary | ICD-10-CM | POA: Diagnosis not present

## 2015-05-01 DIAGNOSIS — I739 Peripheral vascular disease, unspecified: Principal | ICD-10-CM

## 2015-05-01 DIAGNOSIS — E1159 Type 2 diabetes mellitus with other circulatory complications: Secondary | ICD-10-CM | POA: Insufficient documentation

## 2015-05-01 LAB — MYOCARDIAL PERFUSION IMAGING
CHL CUP RESTING HR STRESS: 72 {beats}/min
CSEPPHR: 87 {beats}/min
LV dias vol: 113 mL (ref 62–150)
LVSYSVOL: 49 mL
NUC STRESS TID: 1.13
RATE: 0.24
SDS: 3
SRS: 13
SSS: 16

## 2015-05-01 LAB — LIPID PANEL W/O CHOL/HDL RATIO
Cholesterol, Total: 163 mg/dL (ref 100–199)
HDL: 63 mg/dL (ref >39)
LDL CALC: 89 mg/dL (ref 0–99)
TRIGLYCERIDES: 55 mg/dL (ref 0–149)
VLDL Cholesterol Cal: 11 mg/dL (ref 5–40)

## 2015-05-01 LAB — ECHOCARDIOGRAM COMPLETE
E decel time: 282 msec
FS: 46 % — AB (ref 28–44)
IVS/LV PW RATIO, ED: 1.02
LEFT ATRIUM END SYS DIAM: 39 cm
LVOT area: 3.14 cm2
LVOTPV: 91.7 m/s
MVPG: 3 mmHg
MVPKAVEL: 97.7 m/s
PW: 9.11 mm — AB (ref 0.6–1.1)
Stroke v: 60 ml
VTI: 19 cm

## 2015-05-01 LAB — CBC
HEMATOCRIT: 43.8 % (ref 37.5–51.0)
HEMOGLOBIN: 14.5 g/dL (ref 12.6–17.7)
MCH: 30.3 pg (ref 26.6–33.0)
MCHC: 33.1 g/dL (ref 31.5–35.7)
MCV: 91 fL (ref 79–97)
Platelets: 216 10*3/uL (ref 150–379)
RBC: 4.79 x10E6/uL (ref 4.14–5.80)
RDW: 13.7 % (ref 12.3–15.4)
WBC: 5.6 10*3/uL (ref 3.4–10.8)

## 2015-05-01 LAB — COMPREHENSIVE METABOLIC PANEL
ALK PHOS: 58 IU/L (ref 39–117)
ALT: 10 IU/L (ref 0–44)
AST: 11 IU/L (ref 0–40)
Albumin/Globulin Ratio: 1.6 (ref 1.1–2.5)
Albumin: 4.2 g/dL (ref 3.5–4.8)
BILIRUBIN TOTAL: 0.3 mg/dL (ref 0.0–1.2)
BUN/Creatinine Ratio: 20 (ref 10–22)
BUN: 26 mg/dL (ref 8–27)
CHLORIDE: 99 mmol/L (ref 96–106)
CO2: 23 mmol/L (ref 18–29)
Calcium: 9.4 mg/dL (ref 8.6–10.2)
Creatinine, Ser: 1.28 mg/dL — ABNORMAL HIGH (ref 0.76–1.27)
GFR calc Af Amer: 62 mL/min/{1.73_m2} (ref >59)
GFR calc non Af Amer: 54 mL/min/{1.73_m2} — ABNORMAL LOW (ref >59)
GLUCOSE: 219 mg/dL — AB (ref 65–99)
Globulin, Total: 2.7 g/dL (ref 1.5–4.5)
Potassium: 4.6 mmol/L (ref 3.5–5.2)
Sodium: 139 mmol/L (ref 134–144)
Total Protein: 6.9 g/dL (ref 6.0–8.5)

## 2015-05-01 LAB — TSH: TSH: 1.25 u[IU]/mL (ref 0.450–4.500)

## 2015-05-01 MED ORDER — TECHNETIUM TC 99M SESTAMIBI GENERIC - CARDIOLITE
32.7000 | Freq: Once | INTRAVENOUS | Status: AC | PRN
  Administered 2015-05-01: 32.7 via INTRAVENOUS

## 2015-05-01 MED ORDER — TECHNETIUM TC 99M SESTAMIBI GENERIC - CARDIOLITE
10.1000 | Freq: Once | INTRAVENOUS | Status: AC | PRN
  Administered 2015-05-01: 10.1 via INTRAVENOUS

## 2015-05-01 MED ORDER — REGADENOSON 0.4 MG/5ML IV SOLN
0.4000 mg | Freq: Once | INTRAVENOUS | Status: AC
  Administered 2015-05-01: 0.4 mg via INTRAVENOUS

## 2015-05-01 NOTE — Progress Notes (Signed)
Established Carotid Patient  Referred by:  Asencion Noble, MD [address] [place], [place] [address]  History of Present Illness  Jeremy Patton is a 75 y.o. (24-May-1940) male who presents with chief complaint: follow neck CTA.Marland Kitchen  Previous carotid studies demonstrated: RICA XX123456 stenosis, LICA A999333 stenosis.  Patient has no history of TIA or stroke symptom.  The patient has never had amaurosis fugax or monocular blindness.  The patient has never had facial drooping or hemiplegia.  The patient has never had receptive or expressive aphasia.   The patient notes sx c/w orthostatic hypotension.  The patient's risks factors for carotid disease include: DM, HTN, HLD and prior smoking.  Past Medical History  Diagnosis Date  . Diabetes mellitus without complication ([place]) XX123456  . Hyperlipidemia 11/29/2011  . Low back pain 11/29/2011  . Lumbar radiculopathy 11/29/2011    Left  . Hypertension 11/29/2011  . Pain of left lower leg   . Carotid artery occlusion     Bruit, Right    Past Surgical History  Procedure Laterality Date  . Colonoscopy  08/22/2005 and  07/31/2009    minimal internal hemorrhoids, left-sided diverticulosis  . Tonsillectomy    . Circumcision      Age 23  . Colonoscopy N/A 11/25/2014    Procedure: COLONOSCOPY;  Surgeon: Daneil Dolin, MD;  [facility];  Service: Endoscopy;  Laterality: N/A;  1030  . Esophagogastroduodenoscopy N/A 11/25/2014    Procedure: ESOPHAGOGASTRODUODENOSCOPY (EGD);  Surgeon: Daneil Dolin, MD;  [facility];  Service: Endoscopy;  Laterality: N/A;  . Esophageal dilation N/A 11/25/2014    Procedure: ESOPHAGEAL DILATION;  Surgeon: Daneil Dolin, MD;  [facility];  Service: Endoscopy;  Laterality: N/A;    Social History   Social History  . Marital Status: Married    Spouse Name: N/A  . Number of Children: N/A  . Years of Education: N/A   Occupational History  . Not on file.   Social History Main  Topics  . Smoking status: Former Smoker -- 30 years    Types: Cigarettes    Quit date: 12/17/1991  . Smokeless tobacco: Former Systems developer    Quit date: 12/17/1986  . Alcohol Use: No  . Drug Use: No  . Sexual Activity: Not on file   Other Topics Concern  . Not on file   Social History Narrative    Family History  Problem Relation Age of Onset  . Colon cancer Brother     Age 64  . Stroke Mother   . Heart disease Mother     before age 48    Current Outpatient Prescriptions  Medication Sig Dispense Refill  . acetaminophen (TYLENOL) 500 MG tablet Take 1,000 mg by mouth every 6 (six) hours as needed for moderate pain.    Marland Kitchen aspirin 81 MG tablet Take 81 mg by mouth daily.    . chlorproMAZINE (THORAZINE) 25 MG tablet Take 25 mg by mouth every 6 (six) hours.    . gabapentin (NEURONTIN) 300 MG capsule 300 mg 3 (three) times daily.     Marland Kitchen HUMALOG 100 UNIT/ML injection Inject 3 Units into the skin every morning. Reported on 04/25/2015    . Insulin Aspart (NOVOLOG FLEXPEN [place]) Inject into the skin.    Marland Kitchen LANTUS 100 UNIT/ML injection Inject 30 Units into the skin every morning.     Marland Kitchen lisinopril-hydrochlorothiazide (PRINZIDE,ZESTORETIC) 20-12.5 MG per tablet Take 2 tablets by mouth daily.     Marland Kitchen  metFORMIN (GLUCOPHAGE) 1000 MG tablet Take 1,000 mg by mouth 2 (two) times daily with a meal.     . pantoprazole (PROTONIX) 40 MG tablet     . pioglitazone (ACTOS) 15 MG tablet     . polyethylene glycol-electrolytes (NULYTELY/GOLYTELY) 420 G solution Take 4,000 mLs by mouth once. 4000 mL 0  . rosuvastatin (CRESTOR) 20 MG tablet Take 1 tablet (20 mg total) by mouth daily. 30 tablet 6  . Tamsulosin HCl (FLOMAX) 0.4 MG CAPS Take 0.4 mg by mouth daily.     . verapamil (CALAN-SR) 180 MG CR tablet Take 180 mg by mouth 2 (two) times daily.     No current facility-administered medications for this visit.    No Known Allergies   REVIEW OF SYSTEMS:  (Positives checked otherwise negative)  CARDIOVASCULAR:   [ ]   chest pain,  [ ]  chest pressure,  [ ]  palpitations,  [ ]  shortness of breath when laying flat,  [ ]  shortness of breath with exertion,   [ ]  pain in feet when walking,  [ ]  pain in feet when laying flat, [ ]  history of blood clot in veins (DVT),  [ ]  history of phlebitis,  [ ]  swelling in legs,  [ ]  varicose veins  PULMONARY:   [ ]  productive cough,  [ ]  asthma,  [ ]  wheezing  NEUROLOGIC:   [ ]  weakness in arms or legs,  [ ]  numbness in arms or legs,  [ ]  difficulty speaking or slurred speech,  [ ]  temporary loss of vision in one eye,  [x]  dizziness  HEMATOLOGIC:   [ ]  bleeding problems,  [ ]  problems with blood clotting too easily  MUSCULOSKEL:   [ ]  joint pain, [ ]  joint swelling  GASTROINTEST:   [ ]  vomiting blood,  [ ]  blood in stool     GENITOURINARY:   [ ]  burning with urination,  [ ]  blood in urine  PSYCHIATRIC:   [ ]  history of major depression  INTEGUMENTARY:   [ ]  rashes,  [ ]  ulcers  CONSTITUTIONAL:   [ ]  fever,  [ ]  chills   For VQI Use Only  PRE-ADM LIVING: Home  AMB STATUS: Ambulatory  CAD Sx: None  PRIOR CHF: None  STRESS TEST: []  No, [x]  Normal, [ ]  + ischemia, [ ]  + MI, [ ]  Both   Physical Examination  Filed Vitals:   05/01/15 1606  BP: 117/60  Pulse: 81  Temp: 97.4 F (36.3 C)  TempSrc: Oral  Resp: 18  Height: 5\' 11"  (1.803 m)  Weight: 185 lb 9.6 oz (84.188 kg)  SpO2: 97%   Body mass index is 25.9 kg/(m^2).  General: A&O x 3, WDWN  Head: [place]/AT  Ear/Nose/Throat: Hearing grossly intact, nares w/o erythema or drainage, oropharynx w/o Erythema/Exudate, Mallampati score: 3  Eyes: PERRLA, EOMI  Neck: Supple, no nuchal rigidity, no palpable LAD  Pulmonary: Sym exp, good air movt, CTAB, no rales, rhonchi, & wheezing  Cardiac: RRR, Nl S1, S2, no Murmurs, rubs or gallops  Vascular: Vessel Right Left  Radial Palpable Palpable  Brachial Palpable Palpable  Carotid Palpable, without bruit Palpable, without bruit    Aorta  Not palpable N/A  Femoral Palpable Palpable  Popliteal Not palpable Not palpable  PT Palpable Palpable  DP Palpable Palpable   Gastrointestinal: soft, NTND, -G/R, - HSM, - masses, - CVAT B  Musculoskeletal: M/S 5/5 throughout , Extremities without ischemic changes   Neurologic: CN 2-12 intact , Pain and  light touch intact in extremities , Motor exam as listed above  Psychiatric: Judgment intact, Mood & affect appropriate for pt's clinical situation  Dermatologic: See M/S exam for extremity exam, no rashes otherwise noted  Lymph : No Cervical, Axillary, or Inguinal lymphadenopathy    Non-Invasive Vascular Imaging  CAROTID DUPLEX (Date: 05/01/2015):   R ICA stenosis: 80-99%  R VA: patent and antegrade  L ICA stenosis: 60-79%  L VA: patent and antegrade   CTA Neck (04/28/15) 1. 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 Despite these both internal carotid arteries remain patent, although the right ICA caliber is decreased distal to this stenosis. 2. Bilateral vertebral artery atherosclerosis. Mild right vertebral artery origin stenosis. Moderate to severe left vertebral artery origin stenosis due to calcified plaque. 3. No acute findings in the neck.  Based on my review of this patient's CTA, he has B ICA >90% with surgically accessible calcific disease.   Nuclear stress test (05/01/15):  Nuclear stress EF: 56%. The left ventricular ejection fraction is normal (55-65%).   Transthoracic Echocardiogram (05/01/15): - Left ventricle: The cavity size was normal. Systolic function was  normal. The estimated ejection fraction was in the range of 55%  to 60%. Wall motion was normal; there were no regional wall  motion abnormalities. Left ventricular diastolic function  parameters were normal. - Left atrium: The atrium was mildly dilated.   Medical Decision Making  Jeremy Patton is a 75 y.o. male who presents with:  asx B ICA stenosis >80%.   Based on the patient's vascular studies and examination, I have offered the patient: R CEA.    He scheduled for the 18 MAR 17  If everything goes well, would plan on the L CEA in 2 weeks.  I discussed in depth with the patient the nature of atherosclerosis, and emphasized the importance of maximal medical management including strict control of blood pressure, blood glucose, and lipid levels, obtaining regular exercise, antiplatelet agents, and cessation of smoking.   The patient is currently on a statin: Crestor.  The patient is currently on an anti-platelet: ASA.  The patient is aware that without maximal medical management the underlying atherosclerotic disease process will progress, limiting the benefit of any interventions.  Thank you for allowing [place] to participate in this patient's care.   Adele Barthel, MD [facility] Office: [phone] Pager: [phone]  05/01/2015, 4:34 PM

## 2015-05-01 NOTE — Progress Notes (Addendum)
Patient ID: Jeremy Patton, male   DOB: 1940-12-14, 75 y.o.   MRN: [id number]     Primary MD: Dr. Asencion Noble  PATIENT PROFILE: Jeremy Patton is a 75 y.o. male who is referred through the courtesy of Dr. Adele Barthel for preoperative clearance prior to carotid surgery for high-grade bilateral carotid stenoses   HPI:  Jeremy Patton denies any known history of CAD.  He has a history of hypertension, diabetes mellitus, GERD, and prior carotid disease.  Remotely, prior carotid studies had demonstrated moderate disease.  He Jeremy Patton had a history of TIA or stroke, amaurosis fugax or monocular blindness.  He was recently evaluated by Jeremy Balm, NP and carotid duplex study done on 04/25/2015 showed an 80-99% stenosis with high suspicion for near occlusion with calcific plaque as well as 60-79% stenosis.  The patient subsequently underwent CT angiography on 04/28/2015.  This revealed bilateral high-grade radiographic string signs in the internal carotid arteries at the distal bulb levels.  There also is bilateral vertebral artery atherosclerosis.  Mild right vertebral artery arch and stenosis and moderate to severe left vertebral artery are distant stenosis due to calcified plaque.  Cross of need for probable surgery he was referred for cardiac clearance.  The patient admits to long-standing hypertensive history and has been on verapamil 180 mg twice a day, spironolactone 12.5 mg daily, lisinopril/HCTZ 20/12.5 for blood pressure control.  He is diabetic on Actos and metformin in addition to NovoLog insulin.  He also has a history of hyperlipidemia and has been on low dose simvastatin at just 10 mg.  There is also a history of peripheral neuropathy on gabapentin.  He denies chest pain.  He states that he walks daily for aproximatelyy 35-40 minutes.  He is unaware of any significant change in exercise and denies significant dyspnea.  However, he recently has expressed episodes of dizziness.  There  is a remote tobacco history but he quit in the 1980s after smoking for aproximately 25 years.  Past Medical History  Diagnosis Date  . Diabetes mellitus without complication ([place]) [id number]  . Hyperlipidemia 11/29/2011  . Low back pain 11/29/2011  . Lumbar radiculopathy 11/29/2011    Left  . Hypertension 11/29/2011  . Pain of left lower leg   . Carotid artery occlusion     Bruit, Right    Past Surgical History  Procedure Laterality Date  . Colonoscopy  08/22/2005 and  07/31/2009    minimal internal hemorrhoids, left-sided diverticulosis  . Tonsillectomy    . Circumcision      Age 48  . Colonoscopy N/A 11/25/2014    Procedure: COLONOSCOPY;  Surgeon: Daneil Dolin, MD;  Location: [facility];  Service: Endoscopy;  Laterality: N/A;  1030  . Esophagogastroduodenoscopy N/A 11/25/2014    Procedure: ESOPHAGOGASTRODUODENOSCOPY (EGD);  Surgeon: Daneil Dolin, MD;  Location: [facility];  Service: Endoscopy;  Laterality: N/A;  . Esophageal dilation N/A 11/25/2014    Procedure: ESOPHAGEAL DILATION;  Surgeon: Daneil Dolin, MD;  Location: [facility];  Service: Endoscopy;  Laterality: N/A;    No Known Allergies  Current Outpatient Prescriptions  Medication Sig Dispense Refill  . acetaminophen (TYLENOL) 500 MG tablet Take 1,000 mg by mouth every 6 (six) hours as needed for moderate pain.    Marland Kitchen aspirin 81 MG tablet Take 81 mg by mouth daily.    . chlorproMAZINE (THORAZINE) 25 MG tablet Take 25 mg by mouth every 6 (six) hours.    . gabapentin (  NEURONTIN) 300 MG capsule 300 mg 3 (three) times daily.     Marland Kitchen HUMALOG 100 UNIT/ML injection Inject 3 Units into the skin every morning. Reported on 04/25/2015    . Insulin Aspart (NOVOLOG FLEXPEN [place]) Inject into the skin.    Marland Kitchen LANTUS 100 UNIT/ML injection Inject 30 Units into the skin every morning.     Marland Kitchen lisinopril-hydrochlorothiazide (PRINZIDE,ZESTORETIC) 20-12.5 MG per tablet Take 2 tablets by mouth daily.     . metFORMIN (GLUCOPHAGE) 1000 MG tablet  Take 1,000 mg by mouth 2 (two) times daily with a meal.     . pantoprazole (PROTONIX) 40 MG tablet     . pioglitazone (ACTOS) 15 MG tablet     . polyethylene glycol-electrolytes (NULYTELY/GOLYTELY) 420 G solution Take 4,000 mLs by mouth once. 4000 mL 0  . Tamsulosin HCl (FLOMAX) 0.4 MG CAPS Take 0.4 mg by mouth daily.     . verapamil (CALAN-SR) 180 MG CR tablet Take 180 mg by mouth 2 (two) times daily.    . rosuvastatin (CRESTOR) 20 MG tablet Take 1 tablet (20 mg total) by mouth daily. 30 tablet 6   No current facility-administered medications for this visit.    Social History   Social History  . Marital Status: Married    Spouse Name: N/A  . Number of Children: N/A  . Years of Education: N/A   Occupational History  . Not on file.   Social History Main Topics  . Smoking status: Former Smoker -- 30 years    Types: Cigarettes    Quit date: 12/17/1991  . Smokeless tobacco: Former Systems developer    Quit date: 12/17/1986  . Alcohol Use: No  . Drug Use: No  . Sexual Activity: Not on file   Other Topics Concern  . Not on file   Social History Narrative    Family History  Problem Relation Age of Onset  . Colon cancer Brother     Age 68  . Stroke Mother   . Heart disease Mother     before age 47   Family history is notable that his father died at 89 with Alzheimer's disease.  His mother died secondary to myocardial infarction at age 7.  One of 3 brothers is deceased secondary to colon cancer.  He has 2 living sisters.  ROS General: Negative; No fevers, chills, or night sweats; positive for dizziness HEENT: Negative; No changes in vision or hearing, sinus congestion, difficulty swallowing Pulmonary: Negative; No cough, wheezing, shortness of breath, hemoptysis Cardiovascular:  See HPI; No chest pain, presyncope, syncope, palpitations, edema GI: Positive for GERD; No nausea, vomiting, diarrhea, or abdominal pain GU: Negative; No dysuria, hematuria, or difficulty  voiding Musculoskeletal: Negative; no myalgias, joint pain, or weakness Hematologic/Oncologic: Negative; no easy bruising, bleeding Endocrine: Positive for diabetes mellitus Neuro: Positive for peripheral neuropathy Skin: Negative; No rashes or skin lesions Psychiatric: Negative; No behavioral problems, depression Sleep: Negative; No daytime sleepiness, hypersomnolence, bruxism, restless legs, hypnogagnic hallucinations Other comprehensive 14 point system review is negative   Physical Exam BP 110/60 mmHg  Pulse 79  Ht 5\' 11"  (1.803 m)  Wt 184 lb 6.4 oz (83.643 kg)  BMI 25.73 kg/m2  Wt Readings from Last 3 Encounters:  05/01/15 185 lb 9.6 oz (84.188 kg)  04/29/15 184 lb 6.4 oz (83.643 kg)  04/25/15 185 lb 6.4 oz (84.097 kg)   General: Alert, oriented, no distress.  Skin: normal turgor, no rashes, warm and dry HEENT: Normocephalic, atraumatic. Pupils equal round and reactive  to light; sclera anicteric; extraocular muscles intact; Fundi Without hemorrhages or exudates Nose without nasal septal hypertrophy Mouth/Parynx benign; Mallinpatti scale Neck: High-pitched bilateral carotid bruits; No JVD,  Lungs: clear to ausculatation and percussion; no wheezing or rales Chest wall: without tenderness to palpitation Heart: PMI not displaced, RRR, s1 s2 normal, 1/6 systolic murmur, no diastolic murmur, no rubs, gallops, thrills, or heaves Abdomen: soft, nontender; no hepatosplenomehaly, BS+; abdominal aorta nontender and not dilated by palpation. Back: no CVA tenderness Pulses 2+ Musculoskeletal: full range of motion, normal strength, no joint deformities Extremities: no clubbing cyanosis or edema, Homan's sign negative  Neurologic: grossly nonfocal; Cranial nerves grossly wnl Psychologic: Normal mood and affect   ECG (independently read by me): Normal sinus rhythm at 79 bpm.  Left axis deviation.  No ectopy.  LABS: BMP Latest Ref Rng 04/25/2015  BUN 7 - 25 mg/dL 26(H)  Creatinine 0.70  - 1.18 mg/dL 1.31(H)     No flowsheet data found.  No flowsheet data found. No results found for: MCV No results found for: TSH No results found for: HGBA1C   BNP No results found for: BNP  ProBNP No results found for: PROBNP   Lipid Panel  No results found for: CHOL, TRIG, HDL, CHOLHDL, VLDL, LDLCALC, LDLDIRECT  RADIOLOGY: Ct Angio Neck W/cm &/or Wo/cm  04/28/2015  ADDENDUM REPORT: 04/28/2015 09:48 ADDENDUM: Study discussed by telephone with NP Jeremy Patton on 04/28/2015 at 0946 hours. Electronically Signed   By: Genevie Ann M.D.   On: 04/28/2015 09:48  04/28/2015  CLINICAL DATA:  75 year old male with right ICA severe stenosis versus occlusion. Subsequent encounter. EXAM: CT ANGIOGRAPHY NECK TECHNIQUE: Multidetector CT imaging of the neck was performed using the standard protocol during bolus administration of intravenous contrast. Multiplanar CT image reconstructions and MIPs were obtained to evaluate the vascular anatomy. Carotid stenosis measurements (when applicable) are obtained utilizing NASCET criteria, using the distal internal carotid diameter as the denominator. CONTRAST:  100 mL Isovue 370 COMPARISON:  [facility] Carotid Doppler ultrasound 12/02/2011 FINDINGS: Aortic arch: Bovine type arch configuration. Mild arch calcified atherosclerosis. More extensive partially visible left coronary calcified atherosclerosis (series 5, image 1). Right carotid system: Mild plaque at the right CCA origin without stenosis. Mild circumferential soft plaque in the right CCA proximal to the bifurcation. At the right carotid bifurcation there is extensive calcified and soft plaque continuing into the right ICA bulb. From its origin there is mild to moderate stenosis along the first 12-13 mm of the right ICA (series 604, image 100). Then at the distal bulb level there is focal high-grade radiographic string sign stenosis (same image). Despite this the right ICA remains patent to the skullbase.  The distal cervical right ICA and visible siphon are smaller than the left ICA but otherwise normal. Left carotid system: Bovine type left CCA origin with no stenosis. Mild proximal left CCA tortuosity and circumferential soft plaque. Partially retropharyngeal course of the left carotid bifurcation and left ICA incidentally noted. At the left carotid bifurcation there is bulky mostly calcified plaque extending from the ICA origin to the distal bulb where there is a 4-5 mm segment of high-grade radiographic string sign stenosis (series 603, image 124). Despite this the left ICA remains patent, and is otherwise negative to the left petrous segment. Vertebral arteries: No proximal right subclavian artery stenosis. Calcified plaque at the right vertebral artery origin resulting an mild stenosis (series 603, image 142). Intermittent V2 segment calcification and tortuosity but no hemodynamically significant stenosis  to the skullbase. The right PICA origin is patent. There is mild V4 calcified plaque with no distal right vertebral artery stenosis. Normal visualized vertebrobasilar junction. No proximal left subclavian artery stenosis despite mostly soft plaque. There is dense calcified plaque at the left vertebral artery origin with moderate to severe stenosis (series 603, image 149). Intermittent left V2 segment soft plaque but no other hemodynamically significant stenosis of the left vertebral artery to the skullbase. Mild V4 segment calcified and soft plaque without stenosis. Normal left PICA origin. Skeleton: Cervical spine degeneration. Partially visible thoracic scoliosis. No acute osseous abnormality identified. Mild left maxillary sinus mucosal thickening. Other visualized paranasal sinuses and mastoids are clear. Other neck: Negative lung apices. No superior mediastinal lymphadenopathy. Negative thyroid. Glottis is closed. Pharyngeal soft tissue contours are within normal limits. Negative parapharyngeal,  retropharyngeal, and sublingual spaces. Negative submandibular and parotid glands. No cervical lymphadenopathy. No axillary lymphadenopathy. Negative visualized posterior fossa brain parenchyma and orbits soft tissues. IMPRESSION: 1. 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 Despite these both internal carotid arteries remain patent, although the right ICA caliber is decreased distal to this stenosis. 2. Bilateral vertebral artery atherosclerosis. Mild right vertebral artery origin stenosis. Moderate to severe left vertebral artery origin stenosis due to calcified plaque. 3. No acute findings in the neck. Electronically Signed: By: Genevie Ann M.D. On: 04/28/2015 09:38     ASSESSMENT AND PLAN: Jeremy Patton is a 75 year old gentleman who has cardiac risk factors notable for hypertension, diabetes mellitus, as well as peripheral vascular disease.  He was recently found to have significant progression of previously noted carotid disease with bilateral radiographic string sign stenosis of both the right and left carotid system.  He has experienced episodes of dizziness.  Review his medications reveals that he is on both spironolactone as well as HCTZ since he has been taking lisinopril HCTZ regimen.  With his dizziness, I have suggested he discontinue Aldactone and continue his verapamil and lisinopril HCT as previously prescribed.  In light of his need for surgery and his cardiac risk factors, I am scheduling him for a Lexiscan Myoview study this week for risk stratification prior to giving clearance.  With his hypertension, diabetes mellitus, and dizziness I am also scheduling him for a 2-D echo Doppler study.  Since he has significant PVD with carotid stenoses.  I have recommended he discontinue simvastatin and will change this to Crestor 20 mg for more potent statin therapy.  I will see him back in one week for follow-up evaluation and further recommendations  will be made at that time.   Jeremy Sine, MD, [facility] 05/01/2015 8:02 PM

## 2015-05-05 ENCOUNTER — Encounter (HOSPITAL_COMMUNITY)
Admission: RE | Admit: 2015-05-05 | Discharge: 2015-05-05 | Disposition: A | Discharge status: Home / Self Care | Payer: Medicare Other | Source: Ambulatory Visit | Attending: Vascular Surgery | Admitting: Vascular Surgery

## 2015-05-05 ENCOUNTER — Encounter (HOSPITAL_COMMUNITY): Payer: Self-pay

## 2015-05-05 ENCOUNTER — Encounter: Payer: Self-pay | Admitting: Cardiovascular Disease

## 2015-05-05 ENCOUNTER — Ambulatory Visit (INDEPENDENT_AMBULATORY_CARE_PROVIDER_SITE_OTHER): Payer: Medicare Other | Admitting: Cardiovascular Disease

## 2015-05-05 ENCOUNTER — Telehealth: Payer: Self-pay | Admitting: Cardiovascular Disease

## 2015-05-05 ENCOUNTER — Ambulatory Visit: Payer: Medicare Other | Admitting: Cardiovascular Disease

## 2015-05-05 VITALS — BP 126/72 | HR 92 | Ht 71.0 in | Wt 189.4 lb

## 2015-05-05 DIAGNOSIS — Z794 Long term (current) use of insulin: Secondary | ICD-10-CM

## 2015-05-05 DIAGNOSIS — I10 Essential (primary) hypertension: Secondary | ICD-10-CM

## 2015-05-05 DIAGNOSIS — R42 Dizziness and giddiness: Secondary | ICD-10-CM | POA: Diagnosis not present

## 2015-05-05 DIAGNOSIS — E785 Hyperlipidemia, unspecified: Secondary | ICD-10-CM | POA: Diagnosis not present

## 2015-05-05 DIAGNOSIS — I6523 Occlusion and stenosis of bilateral carotid arteries: Secondary | ICD-10-CM

## 2015-05-05 DIAGNOSIS — E1159 Type 2 diabetes mellitus with other circulatory complications: Secondary | ICD-10-CM

## 2015-05-05 DIAGNOSIS — Z01818 Encounter for other preprocedural examination: Secondary | ICD-10-CM | POA: Insufficient documentation

## 2015-05-05 HISTORY — DX: Presence of spectacles and contact lenses: Z97.3

## 2015-05-05 HISTORY — DX: Gastro-esophageal reflux disease without esophagitis: K21.9

## 2015-05-05 LAB — SURGICAL PCR SCREEN
MRSA, PCR: NEGATIVE
STAPHYLOCOCCUS AUREUS: NEGATIVE

## 2015-05-05 LAB — TYPE AND SCREEN
ABO/RH(D): B NEG
ANTIBODY SCREEN: NEGATIVE

## 2015-05-05 LAB — URINALYSIS, ROUTINE W REFLEX MICROSCOPIC
GLUCOSE, UA: NEGATIVE mg/dL
HGB URINE DIPSTICK: NEGATIVE
Ketones, ur: NEGATIVE mg/dL
Leukocytes, UA: NEGATIVE
NITRITE: NEGATIVE
PH: 5.5 (ref 5.0–8.0)
Protein, ur: NEGATIVE mg/dL
SPECIFIC GRAVITY, URINE: 1.024 (ref 1.005–1.030)

## 2015-05-05 LAB — ABO/RH: ABO/RH(D): B NEG

## 2015-05-05 LAB — PROTIME-INR
INR: 1.02 (ref 0.00–1.49)
PROTHROMBIN TIME: 13.6 s (ref 11.6–15.2)

## 2015-05-05 LAB — APTT: APTT: 29 s (ref 24–37)

## 2015-05-05 LAB — GLUCOSE, CAPILLARY: Glucose-Capillary: 130 mg/dL — ABNORMAL HIGH (ref 65–99)

## 2015-05-05 MED ORDER — DEXTROSE 5 % IV SOLN
1.5000 g | INTRAVENOUS | Status: AC
  Administered 2015-05-06: 1.5 g via INTRAVENOUS
  Filled 2015-05-05: qty 1.5

## 2015-05-05 MED ORDER — SODIUM CHLORIDE 0.9 % IV SOLN: INTRAVENOUS | Status: DC

## 2015-05-05 NOTE — Progress Notes (Signed)
Pt denies SOB and chest pain but is under the care of Dr. Claiborne Billings, Cardiology. Pt is scheduled to follow up with cardiology today for surgical clearance. Pt stated " I need surgery on both arteries but I am not sure which one the doctor is doing tomorrow ; "  consent for procedure on DOS. Pt stated that he was previously instructed not to take any insulin on the morning of surgery. Pt chart forwarded to anesthesia to review cardiac history and f/u with cardiology for clearance.

## 2015-05-05 NOTE — Progress Notes (Addendum)
Anesthesia Chart Review: Patient is a 75 year old male scheduled for right CEA on 05/06/15 by Dr. Bridgett Larsson.  History includes former smoker, DM2, HLD, HTN, carotid occlusive disease, tonsillectomy, left lumbar radiculopathy. PCP is listed as Dr. Asencion Noble.  Meds include ASA 81 mg, Thorazine, Neurontin, Humalog, Novolog, Lantus, lisinopril-HCTZ, metformin, Protonix, Actos, Crestor, Flomax, verapamil.   04/29/15 EKG: NSR, LAD, consider LAFB.  05/01/15 Echo: Study Conclusions - Left ventricle: The cavity size was normal. Systolic function was normal. The estimated ejection fraction was in the range of 55% to 60%. Wall motion was normal; there were no regional wall motion abnormalities. Left ventricular diastolic function parameters were normal. - Left atrium: The atrium was mildly dilated.  05/01/15 Nuclear stress test:   The left ventricular ejection fraction is normal (55-65%).  Nuclear stress EF: 56%.  There was no ST segment deviation noted during stress.  No T wave inversion was noted during stress.  Defect 1: There is a large defect of moderate severity present in the basal inferior, basal inferolateral, mid inferior, mid inferolateral, apical inferior and apical lateral location. This is likely diaphragmatic attenuation given the normal wall motion. However, cannot rule out prior infarct. There is no ischemia.  This is a low risk study. Results were reviewed by Dr. Claiborne Billings. Due to the large inferolateral defect (scar vs attenuation), he wanted to discuss with patient in the office. He is seeing patient this afternoon to review and determine clearance status.   04/25/15 Carotid U/S: 1. 80-99% stenosis RICA, suspect near occlusion d/t minimal diastolic flow in the CCA and markedly dampened waveforms in the distal ICA.  2. RECA > 50% stenosis. 3. [id number] LICA stenosis, however plaque is calcific and cannot be thoroughly evaluated. 4. Bilateral VA flow antegrade.  04/28/15 CTA neck: IMPRESSION: 1.  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 Despite these both internal carotid arteries remain patent, although the right ICA caliber is decreased distal to this stenosis. 2. Bilateral vertebral artery atherosclerosis. Mild right vertebral artery origin stenosis. Moderate to severe left vertebral artery origin stenosis due to calcified plaque. 3. No acute findings in the neck.  Labs from 04/30/15 noted. Cr 1.28. CBC WNL. Glucose 219. TSH 1.250. 05/05/15 labs showed PT/PTT WNL. T&S done. UA negative leukocytes, nitrites.  A1c in process.  Patient was scheduled to see Dr. Claiborne Billings after 4PM today to discuss clearance status. His note is not yet viewable in Epic. Plans to proceed as scheduled will depend on cardiology recommendations. (Update: per Saverio Danker, CMA notation, "Patient seen in the office today and cleared for surgery.")  George Hugh [facility] Short Stay Center/Anesthesiology Phone [phone] 05/05/2015 5:24 PM

## 2015-05-05 NOTE — Progress Notes (Signed)
Patient ID: Jeremy Patton, male   DOB: 1941/02/12, 75 y.o.   MRN: [id number]     Primary MD: Dr. Carylon Perches Referral  M.D.: Dr. Leonides Sake  PATIENT PROFILE: Jeremy Patton is a 75 y.o. male who is referred through the courtesy of Dr. Leonides Sake for preoperative clearance prior to carotid surgery for high-grade bilateral carotid stenoses.  I initially saw him last week and he presents today for follow-up evaluation prior to giving clearance.   HPI:  Jeremy Patton denies any known history of CAD.  He has a history of hypertension, diabetes mellitus, GERD, and prior carotid disease.  Remotely, prior carotid studies had demonstrated moderate disease.  He Jeremy Patton had a history of TIA or stroke, amaurosis fugax or monocular blindness.  He was recently evaluated by Jeremy Males, Jeremy and carotid duplex study done on 04/25/2015 showed an 80-99% stenosis with high suspicion for near occlusion with calcific plaque as well as 60-79% stenosis.  The patient subsequently underwent CT angiography on 04/28/2015.  This revealed bilateral high-grade radiographic string signs in the internal carotid arteries at the distal bulb levels.  There also is bilateral vertebral artery atherosclerosis.  Mild right vertebral artery arch and stenosis and moderate to severe left vertebral artery are distant stenosis due to calcified plaque.  Cross of need for probable surgery he was referred for cardiac clearance.  The patient admits to long-standing hypertensive history and has been on verapamil 180 mg twice a day, spironolactone 12.5 mg daily, lisinopril/HCTZ 20/12.5 for blood pressure control.  He is diabetic on Actos and metformin in addition to NovoLog insulin.  He also has a history of hyperlipidemia and has been on low dose simvastatin at just 10 mg.  There is also a history of peripheral neuropathy on gabapentin.  He denies chest pain.  He states that he walks daily for aproximatelyy 35-40 minutes.  He is  unaware of any significant change in exercise and denies significant dyspnea.  However, he recently has expressed episodes of dizziness.  There is a remote tobacco history but he quit in the 1980s after smoking for aproximately 25 years.  I saw him last week, I scheduled him for a 2-D echo Doppler study as well as a Lexiscan Myoview study.  The Lexiscan study showed an ejection fraction at 56%.  He did not have any ECG changes noted during stress.  Scintigraphic images reveal a large defect of moderate severity in the inferior to inferolateral wall without associated ischemia.  Gated images showed normal wall motion and it was felt most likely that this was due to extensive diaphragmatic attenuation in light of normal wall motion, but a  MI , unassociated with wall motion abnormality cannot be completely excluded. Importantly, there was no ischemia.  An echo Doppler study reconfirmed normal systolic function.  Wall motion was normal without regional wall motion abnormalities.  He had normal diastolic parameters.  There was mild left atrial dilatation.  There was mild aortic sclerosis without stenosis.  He is scheduled for carotid surgery tomorrow and presents today for follow-up evaluation and final clearance.  Past Medical History  Diagnosis Date  . Diabetes mellitus without complication (HCC) 11/29/2011  . Hyperlipidemia 11/29/2011  . Low back pain 11/29/2011  . Lumbar radiculopathy 11/29/2011    Left  . Hypertension 11/29/2011  . Pain of left lower leg   . Carotid artery occlusion     Bruit, Right  . GERD (gastroesophageal reflux disease)   . Wears glasses  Past Surgical History  Procedure Laterality Date  . Colonoscopy  08/22/2005 and  07/31/2009    minimal internal hemorrhoids, left-sided diverticulosis  . Tonsillectomy    . Circumcision      Age 78  . Colonoscopy N/A 11/25/2014    Procedure: COLONOSCOPY;  Surgeon: Daneil Dolin, MD;  Location: [facility];  Service: Endoscopy;   Laterality: N/A;  1030  . Esophagogastroduodenoscopy N/A 11/25/2014    Procedure: ESOPHAGOGASTRODUODENOSCOPY (EGD);  Surgeon: Daneil Dolin, MD;  Location: [facility];  Service: Endoscopy;  Laterality: N/A;  . Esophageal dilation N/A 11/25/2014    Procedure: ESOPHAGEAL DILATION;  Surgeon: Daneil Dolin, MD;  Location: [facility];  Service: Endoscopy;  Laterality: N/A;  . Cataract extraction w/ intraocular lens  implant, bilateral    . Multiple tooth extractions      No Known Allergies  Current Outpatient Prescriptions  Medication Sig Dispense Refill  . acetaminophen (TYLENOL) 500 MG tablet Take 1,000 mg by mouth every 6 (six) hours as needed for moderate pain.    Marland Kitchen aspirin 81 MG tablet Take 81 mg by mouth daily.    . chlorproMAZINE (THORAZINE) 25 MG tablet Take 25 mg by mouth 4 (four) times daily as needed for nausea.     Marland Kitchen gabapentin (NEURONTIN) 300 MG capsule 300 mg 2 (two) times daily.     . Insulin Aspart (NOVOLOG FLEXPEN [place]) Inject 3 Units into the skin daily.     Marland Kitchen LANTUS 100 UNIT/ML injection Inject 30 Units into the skin every morning.     Marland Kitchen lisinopril-hydrochlorothiazide (PRINZIDE,ZESTORETIC) 20-12.5 MG per tablet Take 2 tablets by mouth daily.     . metFORMIN (GLUCOPHAGE) 1000 MG tablet Take 1,000 mg by mouth 2 (two) times daily with a meal.     . pantoprazole (PROTONIX) 40 MG tablet Take 40 mg by mouth daily.     . pioglitazone (ACTOS) 15 MG tablet Take 15 mg by mouth daily.     . rosuvastatin (CRESTOR) 20 MG tablet Take 1 tablet (20 mg total) by mouth daily. 30 tablet 6  . Tamsulosin HCl (FLOMAX) 0.4 MG CAPS Take 0.4 mg by mouth daily.     . verapamil (CALAN-SR) 180 MG CR tablet Take 180 mg by mouth 2 (two) times daily.     No current facility-administered medications for this visit.   Facility-Administered Medications Ordered in Other Visits  Medication Dose Route Frequency Provider Last Rate Last Dose  . [START ON 05/06/2015] 0.9 %  sodium chloride infusion    Intravenous Continuous Jeremy [place], MD      . Derrill Memo ON 05/06/2015] cefUROXime (ZINACEF) 1.5 g in dextrose 5 % 50 mL IVPB  1.5 g Intravenous To SS-Surg Jeremy , MD        Social History   Social History  . Marital Status: Married    Spouse Name: N/A  . Number of Children: N/A  . Years of Education: N/A   Occupational History  . Not on file.   Social History Main Topics  . Smoking status: Former Smoker -- 30 years    Types: Cigarettes    Quit date: 12/17/1991  . Smokeless tobacco: Never Used  . Alcohol Use: No  . Drug Use: No  . Sexual Activity: Not on file   Other Topics Concern  . Not on file   Social History Narrative    Family History  Problem Relation Age of Onset  . Colon cancer Brother  Age 41  . Stroke Mother   . Heart disease Mother     before age 65   Family history is notable that his father died at 36 with Alzheimer's disease.  His mother died secondary to myocardial infarction at age 53.  One of 3 brothers is deceased secondary to colon cancer.  He has 2 living sisters.  ROS General: Negative; No fevers, chills, or night sweats; positive for dizziness HEENT: Negative; No changes in vision or hearing, sinus congestion, difficulty swallowing Pulmonary: Negative; No cough, wheezing, shortness of breath, hemoptysis Cardiovascular:  See HPI; No chest pain, presyncope, syncope, palpitations, edema GI: Positive for GERD; No nausea, vomiting, diarrhea, or abdominal pain GU: Negative; No dysuria, hematuria, or difficulty voiding Musculoskeletal: Negative; no myalgias, joint pain, or weakness Hematologic/Oncologic: Negative; no easy bruising, bleeding Endocrine: Positive for diabetes mellitus Neuro: Positive for peripheral neuropathy Skin: Negative; No rashes or skin lesions Psychiatric: Negative; No behavioral problems, depression Sleep: Negative; No daytime sleepiness, hypersomnolence, bruxism, restless legs, hypnogagnic hallucinations Other  comprehensive 14 point system review is negative   Physical Exam BP 126/72 mmHg  Pulse 92  Ht _0  (1.803 m)  Wt 189 lb 6.4 oz (85.911 kg)  BMI 26.43 kg/m2  Wt Readings from Last 3 Encounters:  05/05/15 189 lb 6.4 oz (85.911 kg)  05/05/15 185 lb 9 oz (84.171 kg)  05/01/15 185 lb 9.6 oz (84.188 kg)   General: Alert, oriented, no distress.  Skin: normal turgor, no rashes, warm and dry HEENT: Normocephalic, atraumatic. Pupils equal round and reactive to light; sclera anicteric; extraocular muscles intact; Fundi Without hemorrhages or exudates Nose without nasal septal hypertrophy Mouth/Parynx benign; Mallinpatti scale Neck: High-pitched bilateral carotid bruits; No JVD,  Lungs: clear to ausculatation and percussion; no wheezing or rales Chest wall: without tenderness to palpitation Heart: PMI not displaced, RRR, s1 s2 normal, 1/6 systolic murmur, no diastolic murmur, no rubs, gallops, thrills, or heaves Abdomen: soft, nontender; no hepatosplenomehaly, BS+; abdominal aorta nontender and not dilated by palpation. Back: no CVA tenderness Pulses 2+ Musculoskeletal: full range of motion, normal strength, no joint deformities Extremities: no clubbing cyanosis or edema, Homan's sign negative  Neurologic: grossly nonfocal; Cranial nerves grossly wnl Psychologic: Normal mood and affect   ECG (independently read by me): Normal sinus rhythm at 79 bpm.  Left axis deviation.  No ectopy.  LABS: BMP Latest Ref Rng 04/30/2015 04/25/2015  Glucose 65 - 99 mg/dL 219(H) -  BUN 8 - 27 mg/dL 26 26(H)  Creatinine 0.76 - 1.27 mg/dL 1.28(H) 1.31(H)  BUN/Creat Ratio 10 - 22 20 -  Sodium 134 - 144 mmol/L 139 -  Potassium 3.5 - 5.2 mmol/L 4.6 -  Chloride 96 - 106 mmol/L 99 -  CO2 18 - 29 mmol/L 23 -  Calcium 8.6 - 10.2 mg/dL 9.4 -     Hepatic Function Latest Ref Rng 04/30/2015  Total Protein 6.0 - 8.5 g/dL 6.9  Albumin 3.5 - 4.8 g/dL 4.2  AST 0 - 40 IU/L 11  ALT 0 - 44 IU/L 10  Alk Phosphatase 39  - 117 IU/L 58  Total Bilirubin 0.0 - 1.2 mg/dL 0.3    CBC Latest Ref Rng 04/30/2015  WBC 3.4 - 10.8 x10E3/uL 5.6  Hematocrit 37.5 - 51.0 % 43.8  Platelets 150 - 379 x10E3/uL 216   Lab Results  Component Value Date   MCV 91 04/30/2015   Lab Results  Component Value Date   TSH 1.250 04/30/2015   No results found for:  HGBA1C   BNP No results found for: BNP  ProBNP No results found for: PROBNP   Lipid Panel     Component Value Date/Time   CHOL 163 04/30/2015 0813   TRIG 55 04/30/2015 0813   HDL 63 04/30/2015 0813   LDLCALC 89 04/30/2015 0813    RADIOLOGY: Ct Angio Neck W/cm &/or Wo/cm  04/28/2015  ADDENDUM REPORT: 04/28/2015 09:48 ADDENDUM: Study discussed by telephone with Jeremy Patton on 04/28/2015 at 0946 hours. Electronically Signed   By: Genevie Ann M.D.   On: 04/28/2015 09:48  04/28/2015  CLINICAL DATA:  75 year old male with right ICA severe stenosis versus occlusion. Subsequent encounter. EXAM: CT ANGIOGRAPHY NECK TECHNIQUE: Multidetector CT imaging of the neck was performed using the standard protocol during bolus administration of intravenous contrast. Multiplanar CT image reconstructions and MIPs were obtained to evaluate the vascular anatomy. Carotid stenosis measurements (when applicable) are obtained utilizing NASCET criteria, using the distal internal carotid diameter as the denominator. CONTRAST:  100 mL Isovue 370 COMPARISON:  [facility] Carotid Doppler ultrasound 12/02/2011 FINDINGS: Aortic arch: Bovine type arch configuration. Mild arch calcified atherosclerosis. More extensive partially visible left coronary calcified atherosclerosis (series 5, image 1). Right carotid system: Mild plaque at the right CCA origin without stenosis. Mild circumferential soft plaque in the right CCA proximal to the bifurcation. At the right carotid bifurcation there is extensive calcified and soft plaque continuing into the right ICA bulb. From its origin there is mild to  moderate stenosis along the first 12-13 mm of the right ICA (series 604, image 100). Then at the distal bulb level there is focal high-grade radiographic string sign stenosis (same image). Despite this the right ICA remains patent to the skullbase. The distal cervical right ICA and visible siphon are smaller than the left ICA but otherwise normal. Left carotid system: Bovine type left CCA origin with no stenosis. Mild proximal left CCA tortuosity and circumferential soft plaque. Partially retropharyngeal course of the left carotid bifurcation and left ICA incidentally noted. At the left carotid bifurcation there is bulky mostly calcified plaque extending from the ICA origin to the distal bulb where there is a 4-5 mm segment of high-grade radiographic string sign stenosis (series 603, image 124). Despite this the left ICA remains patent, and is otherwise negative to the left petrous segment. Vertebral arteries: No proximal right subclavian artery stenosis. Calcified plaque at the right vertebral artery origin resulting an mild stenosis (series 603, image 142). Intermittent V2 segment calcification and tortuosity but no hemodynamically significant stenosis to the skullbase. The right PICA origin is patent. There is mild V4 calcified plaque with no distal right vertebral artery stenosis. Normal visualized vertebrobasilar junction. No proximal left subclavian artery stenosis despite mostly soft plaque. There is dense calcified plaque at the left vertebral artery origin with moderate to severe stenosis (series 603, image 149). Intermittent left V2 segment soft plaque but no other hemodynamically significant stenosis of the left vertebral artery to the skullbase. Mild V4 segment calcified and soft plaque without stenosis. Normal left PICA origin. Skeleton: Cervical spine degeneration. Partially visible thoracic scoliosis. No acute osseous abnormality identified. Mild left maxillary sinus mucosal thickening. Other  visualized paranasal sinuses and mastoids are clear. Other neck: Negative lung apices. No superior mediastinal lymphadenopathy. Negative thyroid. Glottis is closed. Pharyngeal soft tissue contours are within normal limits. Negative parapharyngeal, retropharyngeal, and sublingual spaces. Negative submandibular and parotid glands. No cervical lymphadenopathy. No axillary lymphadenopathy. Negative visualized posterior fossa brain parenchyma and orbits soft  tissues. IMPRESSION: 1. 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 Despite these both internal carotid arteries remain patent, although the right ICA caliber is decreased distal to this stenosis. 2. Bilateral vertebral artery atherosclerosis. Mild right vertebral artery origin stenosis. Moderate to severe left vertebral artery origin stenosis due to calcified plaque. 3. No acute findings in the neck. Electronically Signed: By: Genevie Ann M.D. On: 04/28/2015 09:38     ASSESSMENT AND PLAN: Mr. Vasilios Ottaway is a 75 year old gentleman who has cardiac risk factors notable for hypertension, diabetes mellitus, as well as peripheral vascular disease.  He was recently found to have significant progression of previously noted carotid disease with bilateral radiographic string sign stenosis of both the right and left carotid system.  He has experienced episodes of dizziness.  When I saw him last week, I recommended he discontinue his spironolactone.  His blood pressure today is controlled at 126/72 on verapamil SR 180 mg twice a day in addition to lisinopril HCTZ 20/12.52 tablets daily.  I reviewed his nuclear study in detail.  A large defect was present in the inferior to inferolateral wall, but this was unassociated with any wall motion abnormality seen on his echo Doppler study as well as on his gated images on the nuclear study.  He does have increased tissue noted on physical exam in this region and is very possible that  this may be an exaggerated diaphragmatic attenuation artifact.  He denies any clinical history of suffering a myocardial infarction or history of experiencing significant chest pressure.  When I saw him last week, I discontinued his simvastatin and started him on more potent Crestor 20 mg.  Cholesterol was notable for a total cholesterol of 163, LDL of 89, HDL 63 and triglycerides of 55.  He has not yet been on Crestor for long enough duration to reach target less than 70.  I am giving him clearance for his planned surgery tomorrow.  He should have follow-up ECG and telemetry postoperatively. I wiill be available if needed postoperatively.  As long as he does well, I have suggested a follow-up cardiac evaluation in 4 months.  Jeremy Sine, MD, [facility] 05/05/2015 5:56 PM

## 2015-05-05 NOTE — Pre-Procedure Instructions (Signed)
Jeremy Patton  05/05/2015      [facility] - [place], [place] [address] Phone: [phone] Fax: [phone]    Your procedure is scheduled on Tuesday, May 06, 2015  Report to [facility] Admitting at 9:15 A.M.  Call this number if you have problems the morning of surgery:  [phone]   Remember:  Do not eat food or drink liquids after midnight.  Take these medicines the morning of surgery with A SIP OF WATER : aspirin,  chlorproMAZINE (THORAZINE),  gabapentin (NEURONTIN),  pantoprazole (PROTONIX),  verapamil (CALAN-SR), if needed:acetaminophen (TYLENOL) for pain  Stop taking vitamins, fish oil and herbal medications. Do not take any NSAIDs ie: Ibuprofen, Advil, Naproxen, BC and Goody Powder; stop now.   Do not take oral diabetes medicines (pills) the morning of surgery such as metFORMIN (GLUCOPHAGE) and pioglitazone (ACTOS)    THE MORNING OF SURGERY, take no insulin per MD instructions   If your CBG is greater than 220 mg/dL, you may take 1/2 of your sliding scale (correction) dose of insulin.  How to Manage Your Diabetes Before Surgery Why is it important to control my blood sugar before and after surgery?   Improving blood sugar levels before and after surgery helps healing and can limit problems.  A way of improving blood sugar control is eating a healthy diet by:  - Eating less sugar and carbohydrates  - Increasing activity/exercise  - Talk with your doctor about reaching your blood sugar goals  High blood sugars (greater than 180 mg/dL) can raise your risk of infections and slow down your recovery so you will need to focus on controlling your diabetes during the weeks before surgery.  Make sure that the doctor who takes care of your diabetes knows about your planned surgery including the date and location.  How do I manage my blood sugars before surgery?   Check your blood sugar at  least 4 times a day, 2 days before surgery to make sure that they are not too high or low.   Check your blood sugar the morning of your surgery when you wake up and every 2  hours until you get to the Short-Stay unit.  If your blood sugar is less than 70 mg/dL, you will need to treat for low blood sugar by:  Treat a low blood sugar (less than 70 mg/dL) with 1/2 cup of clear juice (cranberry or apple), 4 glucose tablets, OR glucose gel.  Recheck blood sugar in 15 minutes after treatment (to make sure it is greater than 70 mg/dL).  If blood sugar is not greater than 70 mg/dL on re-check, call [phone] for further instructions.   Report your blood sugar to the Short-Stay nurse when you get to Short-Stay.  References:  [facility], 2007 "How to Manage your Diabetes Before and After Surgery".  Do not wear jewelry, make-up or nail polish.  Do not wear lotions, powders, or perfumes.  You may not  wear deodorant.  Do not shave 48 hours prior to surgery.  Men may shave face and neck.  Do not bring valuables to the hospital.  [facility] is not responsible for any belongings or valuables.  Contacts, dentures or bridgework may not be worn into surgery.  Leave your suitcase in the car.  After surgery it may be brought to your room.  For patients admitted to the hospital, discharge time will be  determined by your treatment team.  Patients discharged the day of surgery will not be allowed to drive home.   Name and phone number of your driver:   Special instructions: Shower the night before surgery and the morning of surgery with CHG.  Please read over the following fact sheets that you were given. Pain Booklet, Coughing and Deep Breathing, Blood Transfusion Information, MRSA Information and Surgical Site Infection Prevention

## 2015-05-05 NOTE — Telephone Encounter (Signed)
Dr. Claiborne Billings aware of need for urgent cardiac clearance. Mariann Laster to review with him. Will await MD advice.

## 2015-05-05 NOTE — Patient Instructions (Addendum)
Your physician recommends that you schedule a follow-up appointment in 4 months.  You have been given clearance to have surgery on tomorrow.

## 2015-05-05 NOTE — Telephone Encounter (Signed)
Patient seen in the office today and cleared for surgery.

## 2015-05-05 NOTE — Progress Notes (Signed)
Spoke with Colletta Maryland at Dr. Lianne Moris office to clarify with MD if CBC and CMP needed to be repeated; okay to use labs dated 04/30/15 per [place].

## 2015-05-05 NOTE — Telephone Encounter (Signed)
Request for surgical clearance:  1. What type of surgery is being performed? Rt Cartoid Endarterectomy- Need this today 2. When is this surgery scheduled? 05-06-15   3. Are there any medications that need to be held prior to surgery and how long?-General Clearance  4. Name of physician performing surgery? Dr Adele Barthel 5. What is your office phone and fax number? [phone] and Fax is [phone]  6.

## 2015-05-05 NOTE — Telephone Encounter (Signed)
Per Dr. Claiborne Billings - have patient come in today to see MD for clearance.   Patient will come in after his pre-op appt at 1pm  Notified Dr. Lianne Moris office of plan for clearance.

## 2015-05-06 ENCOUNTER — Inpatient Hospital Stay (HOSPITAL_COMMUNITY): Payer: Medicare Other | Admitting: Vascular Surgery

## 2015-05-06 ENCOUNTER — Inpatient Hospital Stay (HOSPITAL_COMMUNITY): Payer: Medicare Other | Admitting: Critical Care Medicine

## 2015-05-06 ENCOUNTER — Inpatient Hospital Stay (HOSPITAL_COMMUNITY)
Admission: RE | Admit: 2015-05-06 | Discharge: 2015-05-07 | DRG: 039 | Disposition: A | Discharge status: Home Health | Payer: Medicare Other | Source: Ambulatory Visit | Attending: Vascular Surgery | Admitting: Vascular Surgery

## 2015-05-06 ENCOUNTER — Encounter (HOSPITAL_COMMUNITY)
Admission: RE | Disposition: A | Discharge status: Home Health | Payer: Self-pay | Source: Ambulatory Visit | Attending: Vascular Surgery

## 2015-05-06 ENCOUNTER — Encounter (HOSPITAL_COMMUNITY): Payer: Self-pay | Admitting: Critical Care Medicine

## 2015-05-06 DIAGNOSIS — E785 Hyperlipidemia, unspecified: Secondary | ICD-10-CM | POA: Diagnosis present

## 2015-05-06 DIAGNOSIS — Z7982 Long term (current) use of aspirin: Secondary | ICD-10-CM

## 2015-05-06 DIAGNOSIS — Z0183 Encounter for blood typing: Secondary | ICD-10-CM

## 2015-05-06 DIAGNOSIS — I6529 Occlusion and stenosis of unspecified carotid artery: Secondary | ICD-10-CM | POA: Diagnosis present

## 2015-05-06 DIAGNOSIS — Z794 Long term (current) use of insulin: Secondary | ICD-10-CM | POA: Diagnosis not present

## 2015-05-06 DIAGNOSIS — I10 Essential (primary) hypertension: Secondary | ICD-10-CM | POA: Diagnosis present

## 2015-05-06 DIAGNOSIS — I6523 Occlusion and stenosis of bilateral carotid arteries: Secondary | ICD-10-CM | POA: Diagnosis present

## 2015-05-06 DIAGNOSIS — Z01812 Encounter for preprocedural laboratory examination: Secondary | ICD-10-CM

## 2015-05-06 DIAGNOSIS — Z87891 Personal history of nicotine dependence: Secondary | ICD-10-CM

## 2015-05-06 DIAGNOSIS — I6521 Occlusion and stenosis of right carotid artery: Secondary | ICD-10-CM | POA: Diagnosis not present

## 2015-05-06 DIAGNOSIS — I951 Orthostatic hypotension: Secondary | ICD-10-CM | POA: Diagnosis present

## 2015-05-06 DIAGNOSIS — E119 Type 2 diabetes mellitus without complications: Secondary | ICD-10-CM | POA: Diagnosis present

## 2015-05-06 HISTORY — PX: ENDARTERECTOMY: SHX5162

## 2015-05-06 HISTORY — PX: PATCH ANGIOPLASTY: SHX6230

## 2015-05-06 LAB — HEMOGLOBIN A1C
Hgb A1c MFr Bld: 9.1 % — ABNORMAL HIGH (ref 4.8–5.6)
Mean Plasma Glucose: 214 mg/dL

## 2015-05-06 LAB — CREATININE, SERUM
Creatinine, Ser: 0.99 mg/dL (ref 0.61–1.24)
GFR calc Af Amer: >60 mL/min (ref >60)

## 2015-05-06 LAB — CBC
HEMATOCRIT: 32.6 % — AB (ref 39.0–52.0)
Hemoglobin: 10.6 g/dL — ABNORMAL LOW (ref 13.0–17.0)
MCH: 29.4 pg (ref 26.0–34.0)
MCHC: 32.5 g/dL (ref 30.0–36.0)
MCV: 90.6 fL (ref 78.0–100.0)
PLATELETS: 141 10*3/uL — AB (ref 150–400)
RBC: 3.6 MIL/uL — ABNORMAL LOW (ref 4.22–5.81)
RDW: 13.2 % (ref 11.5–15.5)
WBC: 7.4 10*3/uL (ref 4.0–10.5)

## 2015-05-06 LAB — GLUCOSE, CAPILLARY
GLUCOSE-CAPILLARY: 98 mg/dL (ref 65–99)
Glucose-Capillary: 132 mg/dL — ABNORMAL HIGH (ref 65–99)

## 2015-05-06 SURGERY — ENDARTERECTOMY, CAROTID
Anesthesia: General | Site: Neck | Laterality: Right

## 2015-05-06 MED ORDER — PANTOPRAZOLE SODIUM 40 MG PO TBEC
40.0000 mg | DELAYED_RELEASE_TABLET | Freq: Every day | ORAL | Status: DC
  Administered 2015-05-07: 40 mg via ORAL
  Filled 2015-05-06: qty 1

## 2015-05-06 MED ORDER — LABETALOL HCL 5 MG/ML IV SOLN: 10.0000 mg | INTRAVENOUS | Status: DC | PRN

## 2015-05-06 MED ORDER — ACETAMINOPHEN 500 MG PO TABS
1000.0000 mg | ORAL_TABLET | Freq: Four times a day (QID) | ORAL | Status: DC | PRN
Start: 2015-05-06 — End: 2015-05-07

## 2015-05-06 MED ORDER — SENNOSIDES-DOCUSATE SODIUM 8.6-50 MG PO TABS
1.0000 | ORAL_TABLET | Freq: Every evening | ORAL | Status: DC | PRN
Start: 2015-05-06 — End: 2015-05-07

## 2015-05-06 MED ORDER — CHLORPROMAZINE HCL 25 MG PO TABS
25.0000 mg | ORAL_TABLET | Freq: Four times a day (QID) | ORAL | Status: DC | PRN
Start: 2015-05-06 — End: 2015-05-07
  Filled 2015-05-06: qty 1

## 2015-05-06 MED ORDER — MAGNESIUM SULFATE 2 GM/50ML IV SOLN: 2.0000 g | Freq: Every day | INTRAVENOUS | Status: DC | PRN

## 2015-05-06 MED ORDER — HEPARIN SODIUM (PORCINE) 1000 UNIT/ML IJ SOLN
INTRAMUSCULAR | Status: AC
  Filled 2015-05-06: qty 1

## 2015-05-06 MED ORDER — ROCURONIUM BROMIDE 100 MG/10ML IV SOLN
INTRAVENOUS | Status: DC | PRN
  Administered 2015-05-06: 50 mg via INTRAVENOUS

## 2015-05-06 MED ORDER — HYDROCHLOROTHIAZIDE 25 MG PO TABS
25.0000 mg | ORAL_TABLET | Freq: Every day | ORAL | Status: DC
  Administered 2015-05-07: 25 mg via ORAL
  Filled 2015-05-06: qty 1

## 2015-05-06 MED ORDER — PROTAMINE SULFATE 10 MG/ML IV SOLN
INTRAVENOUS | Status: AC
  Filled 2015-05-06: qty 5

## 2015-05-06 MED ORDER — BISACODYL 10 MG RE SUPP: 10.0000 mg | Freq: Every day | RECTAL | Status: DC | PRN

## 2015-05-06 MED ORDER — METOPROLOL TARTRATE 1 MG/ML IV SOLN: 2.0000 mg | INTRAVENOUS | Status: DC | PRN

## 2015-05-06 MED ORDER — ONDANSETRON HCL 4 MG/2ML IJ SOLN
INTRAMUSCULAR | Status: AC
  Filled 2015-05-06: qty 2

## 2015-05-06 MED ORDER — HEMOSTATIC AGENTS (NO CHARGE) OPTIME
TOPICAL | Status: DC | PRN
  Administered 2015-05-06: 1 via TOPICAL

## 2015-05-06 MED ORDER — FENTANYL CITRATE (PF) 100 MCG/2ML IJ SOLN: 25.0000 ug | INTRAMUSCULAR | Status: DC | PRN

## 2015-05-06 MED ORDER — FENTANYL CITRATE (PF) 250 MCG/5ML IJ SOLN
INTRAMUSCULAR | Status: AC
  Filled 2015-05-06: qty 5

## 2015-05-06 MED ORDER — GABAPENTIN 300 MG PO CAPS
300.0000 mg | ORAL_CAPSULE | Freq: Two times a day (BID) | ORAL | Status: DC
  Administered 2015-05-06 – 2015-05-07 (×2): 300 mg via ORAL
  Filled 2015-05-06 (×2): qty 1

## 2015-05-06 MED ORDER — HYDRALAZINE HCL 20 MG/ML IJ SOLN: 5.0000 mg | INTRAMUSCULAR | Status: DC | PRN

## 2015-05-06 MED ORDER — GLYCOPYRROLATE 0.2 MG/ML IJ SOLN
INTRAMUSCULAR | Status: DC | PRN
  Administered 2015-05-06: 0.6 mg via INTRAVENOUS

## 2015-05-06 MED ORDER — PHENYLEPHRINE HCL 10 MG/ML IJ SOLN
10.0000 mg | INTRAMUSCULAR | Status: DC | PRN
  Administered 2015-05-06: 25 ug/min via INTRAVENOUS

## 2015-05-06 MED ORDER — FENTANYL CITRATE (PF) 100 MCG/2ML IJ SOLN
INTRAMUSCULAR | Status: DC | PRN
  Administered 2015-05-06 (×5): 50 ug via INTRAVENOUS

## 2015-05-06 MED ORDER — TAMSULOSIN HCL 0.4 MG PO CAPS
0.4000 mg | ORAL_CAPSULE | Freq: Every day | ORAL | Status: DC
  Administered 2015-05-07: 0.4 mg via ORAL
  Filled 2015-05-06: qty 1

## 2015-05-06 MED ORDER — PROTAMINE SULFATE 10 MG/ML IV SOLN
INTRAVENOUS | Status: DC | PRN
  Administered 2015-05-06 (×5): 10 mg via INTRAVENOUS

## 2015-05-06 MED ORDER — CHLORHEXIDINE GLUCONATE CLOTH 2 % EX PADS: 6.0000 | MEDICATED_PAD | Freq: Once | CUTANEOUS | Status: DC

## 2015-05-06 MED ORDER — LACTATED RINGERS IV SOLN
INTRAVENOUS | Status: DC | PRN
  Administered 2015-05-06 (×2): via INTRAVENOUS

## 2015-05-06 MED ORDER — LIDOCAINE HCL (PF) 1 % IJ SOLN
INTRAMUSCULAR | Status: AC
  Filled 2015-05-06: qty 30

## 2015-05-06 MED ORDER — GUAIFENESIN-DM 100-10 MG/5ML PO SYRP: 15.0000 mL | ORAL_SOLUTION | ORAL | Status: DC | PRN

## 2015-05-06 MED ORDER — ONDANSETRON HCL 4 MG/2ML IJ SOLN: 4.0000 mg | Freq: Four times a day (QID) | INTRAMUSCULAR | Status: DC | PRN

## 2015-05-06 MED ORDER — SODIUM CHLORIDE 0.9 % IV SOLN: 500.0000 mL | Freq: Once | INTRAVENOUS | Status: DC | PRN

## 2015-05-06 MED ORDER — HEPARIN SODIUM (PORCINE) 1000 UNIT/ML IJ SOLN
INTRAMUSCULAR | Status: DC | PRN
  Administered 2015-05-06: 2000 [IU] via INTRAVENOUS
  Administered 2015-05-06: 9000 [IU] via INTRAVENOUS

## 2015-05-06 MED ORDER — GLYCOPYRROLATE 0.2 MG/ML IJ SOLN
INTRAMUSCULAR | Status: AC
  Filled 2015-05-06: qty 2

## 2015-05-06 MED ORDER — ROSUVASTATIN CALCIUM 20 MG PO TABS
20.0000 mg | ORAL_TABLET | Freq: Every day | ORAL | Status: DC
  Administered 2015-05-06: 20 mg via ORAL
  Filled 2015-05-06: qty 1

## 2015-05-06 MED ORDER — SODIUM CHLORIDE 0.9 % IV SOLN
INTRAVENOUS | Status: DC | PRN
  Administered 2015-05-06: 500 mL

## 2015-05-06 MED ORDER — METFORMIN HCL 500 MG PO TABS
1000.0000 mg | ORAL_TABLET | Freq: Two times a day (BID) | ORAL | Status: DC
  Administered 2015-05-07: 1000 mg via ORAL
  Filled 2015-05-06: qty 2

## 2015-05-06 MED ORDER — ASPIRIN EC 81 MG PO TBEC
81.0000 mg | DELAYED_RELEASE_TABLET | Freq: Every day | ORAL | Status: DC
  Administered 2015-05-07: 81 mg via ORAL
  Filled 2015-05-06: qty 1

## 2015-05-06 MED ORDER — LIDOCAINE HCL (CARDIAC) 20 MG/ML IV SOLN
INTRAVENOUS | Status: AC
  Filled 2015-05-06: qty 5

## 2015-05-06 MED ORDER — DEXTRAN 40 IN SALINE 10-0.9 % IV SOLN
INTRAVENOUS | Status: DC | PRN
  Administered 2015-05-06: 500 mL

## 2015-05-06 MED ORDER — MORPHINE SULFATE (PF) 2 MG/ML IV SOLN
2.0000 mg | INTRAVENOUS | Status: DC | PRN
  Administered 2015-05-06: 2 mg via INTRAVENOUS
  Filled 2015-05-06 (×2): qty 1

## 2015-05-06 MED ORDER — ONDANSETRON HCL 4 MG/2ML IJ SOLN: 4.0000 mg | Freq: Once | INTRAMUSCULAR | Status: DC | PRN

## 2015-05-06 MED ORDER — PIOGLITAZONE HCL 15 MG PO TABS
15.0000 mg | ORAL_TABLET | Freq: Every day | ORAL | Status: DC
  Administered 2015-05-07: 15 mg via ORAL
  Filled 2015-05-06: qty 1

## 2015-05-06 MED ORDER — NEOSTIGMINE METHYLSULFATE 10 MG/10ML IV SOLN
INTRAVENOUS | Status: DC | PRN
  Administered 2015-05-06: 4 mg via INTRAVENOUS

## 2015-05-06 MED ORDER — PROPOFOL 10 MG/ML IV BOLUS
INTRAVENOUS | Status: DC | PRN
  Administered 2015-05-06: 20 mg via INTRAVENOUS
  Administered 2015-05-06: 140 mg via INTRAVENOUS
  Administered 2015-05-06: 20 mg via INTRAVENOUS

## 2015-05-06 MED ORDER — 0.9 % SODIUM CHLORIDE (POUR BTL) OPTIME
TOPICAL | Status: DC | PRN
  Administered 2015-05-06: 2000 mL

## 2015-05-06 MED ORDER — INSULIN GLARGINE 100 UNIT/ML ~~LOC~~ SOLN
30.0000 [IU] | Freq: Every day | SUBCUTANEOUS | Status: DC
Start: 2015-05-07 — End: 2015-05-07
  Administered 2015-05-07: 30 [IU] via SUBCUTANEOUS
  Filled 2015-05-06: qty 0.3

## 2015-05-06 MED ORDER — PHENYLEPHRINE HCL 10 MG/ML IJ SOLN
INTRAMUSCULAR | Status: DC | PRN
  Administered 2015-05-06 (×2): 120 ug via INTRAVENOUS
  Administered 2015-05-06: 80 ug via INTRAVENOUS

## 2015-05-06 MED ORDER — DEXTRAN 40 IN SALINE 10-0.9 % IV SOLN
INTRAVENOUS | Status: AC
  Filled 2015-05-06: qty 500

## 2015-05-06 MED ORDER — OXYCODONE HCL 5 MG PO TABS: 5.0000 mg | ORAL_TABLET | ORAL | Status: DC | PRN

## 2015-05-06 MED ORDER — SODIUM CHLORIDE 0.9 % IV SOLN
INTRAVENOUS | Status: DC
  Administered 2015-05-06: 125 mL/h via INTRAVENOUS

## 2015-05-06 MED ORDER — ALUM & MAG HYDROXIDE-SIMETH 200-200-20 MG/5ML PO SUSP: 15.0000 mL | ORAL | Status: DC | PRN

## 2015-05-06 MED ORDER — VERAPAMIL HCL ER 180 MG PO TBCR
180.0000 mg | EXTENDED_RELEASE_TABLET | Freq: Two times a day (BID) | ORAL | Status: DC
  Administered 2015-05-06 – 2015-05-07 (×2): 180 mg via ORAL
  Filled 2015-05-06 (×3): qty 1

## 2015-05-06 MED ORDER — DOCUSATE SODIUM 100 MG PO CAPS
100.0000 mg | ORAL_CAPSULE | Freq: Every day | ORAL | Status: DC
  Administered 2015-05-07: 100 mg via ORAL
  Filled 2015-05-06: qty 1

## 2015-05-06 MED ORDER — ONDANSETRON HCL 4 MG/2ML IJ SOLN
INTRAMUSCULAR | Status: DC | PRN
  Administered 2015-05-06: 4 mg via INTRAVENOUS

## 2015-05-06 MED ORDER — LIDOCAINE HCL (CARDIAC) 20 MG/ML IV SOLN
INTRAVENOUS | Status: DC | PRN
  Administered 2015-05-06: 50 mg via INTRAVENOUS

## 2015-05-06 MED ORDER — LACTATED RINGERS IV SOLN
INTRAVENOUS | Status: DC
  Administered 2015-05-06 (×2): via INTRAVENOUS

## 2015-05-06 MED ORDER — ENOXAPARIN SODIUM 30 MG/0.3ML ~~LOC~~ SOLN: 30.0000 mg | SUBCUTANEOUS | Status: DC

## 2015-05-06 MED ORDER — PROPOFOL 10 MG/ML IV BOLUS
INTRAVENOUS | Status: AC
  Filled 2015-05-06: qty 20

## 2015-05-06 MED ORDER — LISINOPRIL-HYDROCHLOROTHIAZIDE 20-12.5 MG PO TABS: 2.0000 | ORAL_TABLET | Freq: Every day | ORAL | Status: DC

## 2015-05-06 MED ORDER — POTASSIUM CHLORIDE CRYS ER 20 MEQ PO TBCR
20.0000 meq | EXTENDED_RELEASE_TABLET | Freq: Every day | ORAL | Status: DC | PRN
Start: 2015-05-06 — End: 2015-05-07

## 2015-05-06 MED ORDER — DEXTROSE 5 % IV SOLN
1.5000 g | Freq: Two times a day (BID) | INTRAVENOUS | Status: AC
  Administered 2015-05-06 – 2015-05-07 (×2): 1.5 g via INTRAVENOUS
  Filled 2015-05-06 (×2): qty 1.5

## 2015-05-06 MED ORDER — PHENOL 1.4 % MT LIQD: 1.0000 | OROMUCOSAL | Status: DC | PRN

## 2015-05-06 MED ORDER — HYDROCODONE-ACETAMINOPHEN 7.5-325 MG PO TABS
1.0000 | ORAL_TABLET | Freq: Once | ORAL | Status: DC | PRN
Start: 2015-05-06 — End: 2015-05-06

## 2015-05-06 MED ORDER — LISINOPRIL 40 MG PO TABS
40.0000 mg | ORAL_TABLET | Freq: Every day | ORAL | Status: DC
  Administered 2015-05-07: 40 mg via ORAL
  Filled 2015-05-06: qty 1

## 2015-05-06 SURGICAL SUPPLY — 57 items
ADPR TBG 2 MALE LL ART (MISCELLANEOUS)
AGENT HMST SPONGE THK3/8 (HEMOSTASIS) ×1
BAG DECANTER FOR FLEXI CONT (MISCELLANEOUS) ×3 IMPLANT
CANISTER SUCTION 2500CC (MISCELLANEOUS) ×3 IMPLANT
CATH ROBINSON RED A/P 18FR (CATHETERS) ×3 IMPLANT
CATH SUCT 10FR WHISTLE TIP (CATHETERS) IMPLANT
CLIP TI MEDIUM 6 (CLIP) ×3 IMPLANT
CLIP TI WIDE RED SMALL 6 (CLIP) ×3 IMPLANT
COVER PROBE W GEL 5X96 (DRAPES) ×2 IMPLANT
CRADLE DONUT ADULT HEAD (MISCELLANEOUS) ×3 IMPLANT
ELECT REM PT RETURN 9FT ADLT (ELECTROSURGICAL) ×3
ELECTRODE REM PT RTRN 9FT ADLT (ELECTROSURGICAL) ×1 IMPLANT
GAUZE SPONGE 4X4 12PLY STRL (GAUZE/BANDAGES/DRESSINGS) ×6 IMPLANT
GAUZE SPONGE 4X4 16PLY XRAY LF (GAUZE/BANDAGES/DRESSINGS) ×2 IMPLANT
GLOVE BIO SURGEON STRL SZ7 (GLOVE) ×7 IMPLANT
GLOVE BIOGEL PI IND STRL 6.5 (GLOVE) IMPLANT
GLOVE BIOGEL PI IND STRL 7.0 (GLOVE) IMPLANT
GLOVE BIOGEL PI IND STRL 7.5 (GLOVE) ×1 IMPLANT
GLOVE BIOGEL PI INDICATOR 6.5 (GLOVE) ×6
GLOVE BIOGEL PI INDICATOR 7.0 (GLOVE) ×4
GLOVE BIOGEL PI INDICATOR 7.5 (GLOVE) ×6
GLOVE ECLIPSE 6.5 STRL STRAW (GLOVE) ×2 IMPLANT
GLOVE ECLIPSE 7.0 STRL STRAW (GLOVE) ×2 IMPLANT
GLOVE SKINSENSE NS SZ7.0 (GLOVE) ×6
GLOVE SKINSENSE STRL SZ7.0 (GLOVE) IMPLANT
GOWN STRL REUS W/ TWL LRG LVL3 (GOWN DISPOSABLE) ×3 IMPLANT
GOWN STRL REUS W/TWL LRG LVL3 (GOWN DISPOSABLE) ×18
HEMOSTAT SPONGE AVITENE ULTRA (HEMOSTASIS) ×2 IMPLANT
IV ADAPTER SYR DOUBLE MALE LL (MISCELLANEOUS) IMPLANT
KIT BASIN OR (CUSTOM PROCEDURE TRAY) ×3 IMPLANT
KIT ROOM TURNOVER OR (KITS) ×3 IMPLANT
KIT SHUNT ARGYLE CAROTID ART 6 (VASCULAR PRODUCTS) ×2 IMPLANT
LIQUID BAND (GAUZE/BANDAGES/DRESSINGS) ×3 IMPLANT
NS IRRIG 1000ML POUR BTL (IV SOLUTION) ×9 IMPLANT
PACK CAROTID (CUSTOM PROCEDURE TRAY) ×3 IMPLANT
PAD ARMBOARD 7.5X6 YLW CONV (MISCELLANEOUS) ×6 IMPLANT
PATCH VASC XENOSURE 1CMX6CM (Vascular Products) ×3 IMPLANT
PATCH VASC XENOSURE 1X6 (Vascular Products) IMPLANT
SET COLLECT BLD 21X3/4 12 PB (MISCELLANEOUS) IMPLANT
SHUNT CAROTID BYPASS 10 (VASCULAR PRODUCTS) IMPLANT
SHUNT CAROTID BYPASS 12FRX15.5 (VASCULAR PRODUCTS) IMPLANT
SPONGE INTESTINAL PEANUT (DISPOSABLE) ×3 IMPLANT
STOPCOCK 4 WAY LG BORE MALE ST (IV SETS) IMPLANT
SUT ETHILON 3 0 PS 1 (SUTURE) ×2 IMPLANT
SUT MNCRL AB 4-0 PS2 18 (SUTURE) ×3 IMPLANT
SUT PROLENE 5 0 C 1 24 (SUTURE) ×2 IMPLANT
SUT PROLENE 6 0 BV (SUTURE) ×11 IMPLANT
SUT PROLENE 7 0 BV 1 (SUTURE) IMPLANT
SUT SILK 3 0 TIES 17X18 (SUTURE)
SUT SILK 3-0 18XBRD TIE BLK (SUTURE) IMPLANT
SUT VIC AB 3-0 SH 27 (SUTURE) ×3
SUT VIC AB 3-0 SH 27X BRD (SUTURE) ×1 IMPLANT
SYR TB 1ML LUER SLIP (SYRINGE) IMPLANT
SYSTEM CHEST DRAIN TLS 7FR (DRAIN) ×2 IMPLANT
TUBING ART PRESS 48 MALE/FEM (TUBING) IMPLANT
TUBING EXTENTION W/L.L. (IV SETS) IMPLANT
WATER STERILE IRR 1000ML POUR (IV SOLUTION) ×3 IMPLANT

## 2015-05-06 NOTE — H&P (View-Only) (Signed)
Established Carotid Patient  Referred by:  Asencion Noble, MD [address] [place], [place] [address]  History of Present Illness  Jeremy Patton is a 75 y.o. (07/19/1940) male who presents with chief complaint: follow neck CTA.Marland Kitchen  Previous carotid studies demonstrated: RICA [id number] stenosis, LICA [id number] stenosis.  Patient has no history of TIA or stroke symptom.  The patient has never had amaurosis fugax or monocular blindness.  The patient has never had facial drooping or hemiplegia.  The patient has never had receptive or expressive aphasia.   The patient notes sx c/w orthostatic hypotension.  The patient's risks factors for carotid disease include: DM, HTN, HLD and prior smoking.  Past Medical History  Diagnosis Date  . Diabetes mellitus without complication ([place]) [id number]  . Hyperlipidemia 11/29/2011  . Low back pain 11/29/2011  . Lumbar radiculopathy 11/29/2011    Left  . Hypertension 11/29/2011  . Pain of left lower leg   . Carotid artery occlusion     Bruit, Right    Past Surgical History  Procedure Laterality Date  . Colonoscopy  08/22/2005 and  07/31/2009    minimal internal hemorrhoids, left-sided diverticulosis  . Tonsillectomy    . Circumcision      Age 70  . Colonoscopy N/A 11/25/2014    Procedure: COLONOSCOPY;  Surgeon: Daneil Dolin, MD;  [facility];  Service: Endoscopy;  Laterality: N/A;  1030  . Esophagogastroduodenoscopy N/A 11/25/2014    Procedure: ESOPHAGOGASTRODUODENOSCOPY (EGD);  Surgeon: Daneil Dolin, MD;  Location[facility];  Service: Endoscopy;  Laterality: N/A;  . Esophageal dilation N/A 11/25/2014    Procedure: ESOPHAGEAL DILATION;  Surgeon: Daneil Dolin, MD;  Location: [facility];  Service: Endoscopy;  Laterality: N/A;    Social History   Social History  . Marital Status: Married    Spouse Name: N/A  . Number of Children: N/A  . Years of Education: N/A   Occupational History  . Not on file.   Social History Main  Topics  . Smoking status: Former Smoker -- 30 years    Types: Cigarettes    Quit date: 12/17/1991  . Smokeless tobacco: Former Systems developer    Quit date: 12/17/1986  . Alcohol Use: No  . Drug Use: No  . Sexual Activity: Not on file   Other Topics Concern  . Not on file   Social History Narrative    Family History  Problem Relation Age of Onset  . Colon cancer Brother     Age 33  . Stroke Mother   . Heart disease Mother     before age 22    Current Outpatient Prescriptions  Medication Sig Dispense Refill  . acetaminophen (TYLENOL) 500 MG tablet Take 1,000 mg by mouth every 6 (six) hours as needed for moderate pain.    Marland Kitchen aspirin 81 MG tablet Take 81 mg by mouth daily.    . chlorproMAZINE (THORAZINE) 25 MG tablet Take 25 mg by mouth every 6 (six) hours.    . gabapentin (NEURONTIN) 300 MG capsule 300 mg 3 (three) times daily.     Marland Kitchen HUMALOG 100 UNIT/ML injection Inject 3 Units into the skin every morning. Reported on 04/25/2015    . Insulin Aspart (NOVOLOG FLEXPEN [place]) Inject into the skin.    Marland Kitchen LANTUS 100 UNIT/ML injection Inject 30 Units into the skin every morning.     Marland Kitchen lisinopril-hydrochlorothiazide (PRINZIDE,ZESTORETIC) 20-12.5 MG per tablet Take 2 tablets by mouth daily.     Marland Kitchen  metFORMIN (GLUCOPHAGE) 1000 MG tablet Take 1,000 mg by mouth 2 (two) times daily with a meal.     . pantoprazole (PROTONIX) 40 MG tablet     . pioglitazone (ACTOS) 15 MG tablet     . polyethylene glycol-electrolytes (NULYTELY/GOLYTELY) 420 G solution Take 4,000 mLs by mouth once. 4000 mL 0  . rosuvastatin (CRESTOR) 20 MG tablet Take 1 tablet (20 mg total) by mouth daily. 30 tablet 6  . Tamsulosin HCl (FLOMAX) 0.4 MG CAPS Take 0.4 mg by mouth daily.     . verapamil (CALAN-SR) 180 MG CR tablet Take 180 mg by mouth 2 (two) times daily.     No current facility-administered medications for this visit.    No Known Allergies   REVIEW OF SYSTEMS:  (Positives checked otherwise negative)  CARDIOVASCULAR:   [ ]   chest pain,  [ ]  chest pressure,  [ ]  palpitations,  [ ]  shortness of breath when laying flat,  [ ]  shortness of breath with exertion,   [ ]  pain in feet when walking,  [ ]  pain in feet when laying flat, [ ]  history of blood clot in veins (DVT),  [ ]  history of phlebitis,  [ ]  swelling in legs,  [ ]  varicose veins  PULMONARY:   [ ]  productive cough,  [ ]  asthma,  [ ]  wheezing  NEUROLOGIC:   [ ]  weakness in arms or legs,  [ ]  numbness in arms or legs,  [ ]  difficulty speaking or slurred speech,  [ ]  temporary loss of vision in one eye,  [x]  dizziness  HEMATOLOGIC:   [ ]  bleeding problems,  [ ]  problems with blood clotting too easily  MUSCULOSKEL:   [ ]  joint pain, [ ]  joint swelling  GASTROINTEST:   [ ]  vomiting blood,  [ ]  blood in stool     GENITOURINARY:   [ ]  burning with urination,  [ ]  blood in urine  PSYCHIATRIC:   [ ]  history of major depression  INTEGUMENTARY:   [ ]  rashes,  [ ]  ulcers  CONSTITUTIONAL:   [ ]  fever,  [ ]  chills   For VQI Use Only  PRE-ADM LIVING: Home  AMB STATUS: Ambulatory  CAD Sx: None  PRIOR CHF: None  STRESS TEST: []  No, [x]  Normal, [ ]  + ischemia, [ ]  + MI, [ ]  Both   Physical Examination  Filed Vitals:   05/01/15 1606  BP: 117/60  Pulse: 81  Temp: 97.4 F (36.3 C)  TempSrc: Oral  Resp: 18  Height: 5\' 11"  (1.803 m)  Weight: 185 lb 9.6 oz (84.188 kg)  SpO2: 97%   Body mass index is 25.9 kg/(m^2).  General: A&O x 3, WDWN  Head: [place]/AT  Ear/Nose/Throat: Hearing grossly intact, nares w/o erythema or drainage, oropharynx w/o Erythema/Exudate, Mallampati score: 3  Eyes: PERRLA, EOMI  Neck: Supple, no nuchal rigidity, no palpable LAD  Pulmonary: Sym exp, good air movt, CTAB, no rales, rhonchi, & wheezing  Cardiac: RRR, Nl S1, S2, no Murmurs, rubs or gallops  Vascular: Vessel Right Left  Radial Palpable Palpable  Brachial Palpable Palpable  Carotid Palpable, without bruit Palpable, without bruit    Aorta  Not palpable N/A  Femoral Palpable Palpable  Popliteal Not palpable Not palpable  PT Palpable Palpable  DP Palpable Palpable   Gastrointestinal: soft, NTND, -G/R, - HSM, - masses, - CVAT B  Musculoskeletal: M/S 5/5 throughout , Extremities without ischemic changes   Neurologic: CN 2-12 intact , Pain and  light touch intact in extremities , Motor exam as listed above  Psychiatric: Judgment intact, Mood & affect appropriate for pt's clinical situation  Dermatologic: See M/S exam for extremity exam, no rashes otherwise noted  Lymph : No Cervical, Axillary, or Inguinal lymphadenopathy    Non-Invasive Vascular Imaging  CAROTID DUPLEX (Date: 05/01/2015):   R ICA stenosis: 80-99%  R VA: patent and antegrade  L ICA stenosis: 60-79%  L VA: patent and antegrade   CTA Neck (04/28/15) 1. 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 Despite these both internal carotid arteries remain patent, although the right ICA caliber is decreased distal to this stenosis. 2. Bilateral vertebral artery atherosclerosis. Mild right vertebral artery origin stenosis. Moderate to severe left vertebral artery origin stenosis due to calcified plaque. 3. No acute findings in the neck.  Based on my review of this patient's CTA, he has B ICA >90% with surgically accessible calcific disease.   Nuclear stress test (05/01/15):  Nuclear stress EF: 56%. The left ventricular ejection fraction is normal (55-65%).   Transthoracic Echocardiogram (05/01/15): - Left ventricle: The cavity size was normal. Systolic function was  normal. The estimated ejection fraction was in the range of 55%  to 60%. Wall motion was normal; there were no regional wall  motion abnormalities. Left ventricular diastolic function  parameters were normal. - Left atrium: The atrium was mildly dilated.   Medical Decision Making  JALIEN JANSKY is a 75 y.o. male who presents with:  asx B ICA stenosis >80%.   Based on the patient's vascular studies and examination, I have offered the patient: R CEA.    He scheduled for the 18 MAR 17  If everything goes well, would plan on the L CEA in 2 weeks.  I discussed in depth with the patient the nature of atherosclerosis, and emphasized the importance of maximal medical management including strict control of blood pressure, blood glucose, and lipid levels, obtaining regular exercise, antiplatelet agents, and cessation of smoking.   The patient is currently on a statin: Crestor.  The patient is currently on an anti-platelet: ASA.  The patient is aware that without maximal medical management the underlying atherosclerotic disease process will progress, limiting the benefit of any interventions.  Thank you for allowing [place] to participate in this patient's care.   Adele Barthel, MD [facility] Office: [phone] Pager: [phone]  05/01/2015, 4:34 PM

## 2015-05-06 NOTE — Progress Notes (Signed)
Patient reports last void since 10 am and C/O pelvic discomfort. Bladder scan > 999 ml.  Assisted pt to stand position for voiding trial, per request. Able to void only 50 ml. Dr Bridgett Larsson paged and notified. Received order to insert Foley to remove in the morning.

## 2015-05-06 NOTE — Transfer of Care (Signed)
Immediate Anesthesia Transfer of Care Note  Patient: Jeremy Patton  Procedure(s) Performed: Procedure(s): Right Carotid ENDARTERECTOMY  (Right) PATCH ANGIOPLASTY (Right)  Patient Location: PACU  Anesthesia Type:General  Level of Consciousness: awake, alert  and oriented  Airway & Oxygen Therapy: Patient Spontanous Breathing and Patient connected to nasal cannula oxygen  Post-op Assessment: Report given to RN, Post -op Vital signs reviewed and stable and Patient moving all extremities X 4  Post vital signs: Reviewed and stable  Last Vitals:  Filed Vitals:   05/06/15 0959  BP: 157/72  Pulse: 80  Temp: 36.8 C  Resp: 18    Complications: No apparent anesthesia complications

## 2015-05-06 NOTE — Progress Notes (Signed)
      Patient comfortable and alert  No tongue deviation smile is symmetric, grip 5/5 equal  S/P right CEA Disposition stable  COLLINS, EMMA MAUREEN PA-C

## 2015-05-06 NOTE — Progress Notes (Signed)
Repot given to [employer] rn as caregiver

## 2015-05-06 NOTE — Op Note (Signed)
OPERATIVE NOTE  PROCEDURE:   1.  right carotid endarterectomy with bovine patch angioplasty 2.  right intraoperative carotid ultrasound  PRE-OPERATIVE DIAGNOSIS: right asymptomatic carotid stenosis >90%  POST-OPERATIVE DIAGNOSIS: same as above   SURGEON: Adele Barthel, MD  ASSISTANT(S): Gerri Lins, PAC   ANESTHESIA: general  ESTIMATED BLOOD LOSS: 50 cc  FINDING(S): 1. Normal signal in proximal common carotid artery, internal carotid artery and external carotid artery. 2.  Distal common carotid artery has a waterhammer signal in distal common carotid artery, inconsistent with strong pulse in common carotid artery and normal signal in internal carotid artery  3.  No evidence of intimal flap visualized on transverse or longitudinal ultrasonography. 4.  No thrombus visualized throughout carotid arteries 5.  Severely calcified carotid plaque with near occluded lumen in proximal internal carotid artery  SPECIMEN(S):  none  INDICATIONS:   Jeremy Patton is a 75 y.o. male who presents with bilateral asymptomatic carotid stenosis >90%.  On CTA, his right ICA appeared more diseased, so the plan was to complete the R CEA followed by L CEA in 2 weeks.  I discussed with the patient the risks, benefits, and alternatives to carotid endarterectomy.  I discussed the procedural details of carotid endarterectomy with the patient.  The patient is aware that the risks of carotid endarterectomy include but are not limited to: bleeding, infection, stroke, myocardial infarction, death, cranial nerve injuries both temporary and permanent, neck hematoma, possible airway compromise, labile blood pressure post-operatively, cerebral hyperperfusion syndrome, and possible need for additional interventions in the future. The patient is aware of the risks and agrees to proceed forward with the procedure.  DESCRIPTION: After full informed written consent was obtained from the patient, the patient was  brought back to the operating room and placed supine upon the operating table.  Prior to induction, the patient received IV antibiotics.  After obtaining adequate anesthesia, the patient was placed into semi-Fowler position with a shoulder roll in place and the patient's neck slightly hyperextended and rotated away from the surgical site.  The patient was prepped in the standard fashion for a right carotid endarterectomy.  I made an incision anterior to the sternocleidomastoid muscle and dissected down through the subcutaneous tissue.  The platysmas was opened with electrocautery.  Then I dissected down to the internal jugular vein. The internal jugular vein was anomalously more anterior than usual with a large anterior jugular vein present also.  This was dissected posteriorly until I obtained visualization of the common carotid artery.  This was dissected out and then an umbilical tape was placed around the common carotid artery and I loosely applied a Rumel tourniquet.  I then dissected in a periadventitial fashion along the common carotid artery up to the bifurcation.  This required ligation of multiple venous branches and also an occipital artery.   I then identified the external carotid artery and the superior thyroid artery.  A 2-0 silk tie was looped around the superior thyroid artery, and I also dissected out the external carotid artery and placed a vessel loop around it.  In continuing the dissection to the internal carotid artery, I identified the facial vein.  This was ligated and then transected, giving me improved exposure of the internal carotid artery.  In the process of this dissection, the hypoglossal nerve was identified.  I then dissected out the internal carotid artery until I identified an area of soft tissue in the internal carotid artery.  I dissected slightly distal to this  area, and placed an umbilical tape around the artery and loosely applied a Rumel tourniquet.  At this point, we gave  the patient a therapeutic bolus of Heparin intravenously, 9000 units.  In total, I gave 11000 units of Heparin.  After waiting 3 minutes, then I clamped the internal carotid artery, external carotid artery and then the common carotid artery.  I then made an arteriotomy in the common carotid artery with a 11 blade, and extended the arteriotomy with a Potts scissor down into the common carotid artery, then I carried the arteriotomy through the bifurcation into the internal carotid artery until I reached an area that was not diseased.  At this point, I took the 10 shunt that previously been prepared and I inserted it into the internal carotid artery.  The Rumel tourniquet was then applied to this end of the shunt.  I unclamped the shunt to verify retrograde blood flow in the internal carotid artery.  I then placed the other end of the shunt into the common carotid artery after unclamping the artery.  The Rumel was tightened down around the shunt.  At this point, I verified blood flow in the shunt with a continuous doppler.  At this point, I started the endarterectomy in the common carotid artery with a Technical brewer and carried this dissection down into the common carotid artery circumferentially.  Then I transected the plaque at a segment where it was adherent.  I then carried this dissection up into the external carotid artery.  The plaque was extracted by unclamping the external carotid artery and everting the artery.  The dissection was then carried into the internal carotid artery, extracting the remaining portion of the carotid plaque.  I passed the plaque off the field as a specimen.  I then spent the next 30 minutes removing intimal flaps and loose debris.  Eventually I reached the point where the residual plaque was densely adherent and any further dissection would compromise the integrity of the wall.  After verifying that there was no more loose intimal flaps or debris, I re-interrogated the entirety of  this carotid artery.  At this point, I was satisfied that the minimal remaining disease was densely adherent to the wall and wall integrity was intact.  At this point, I then fashioned a bovine pericardial patch for the geometry of this artery and sewed it in place with two running stitch of 6-0 Prolene, one from each end.  Prior to completing this patch angioplasty, I removed the shunt first from the internal carotid artery, from which there was excellent backbleeding, and clamped it.  Then I removed the shunt from the common carotid artery, from which there was excellent antegrade bleeding, and then clamped it.  At this point, I allowed the external carotid artery to backbleed, which was excellent.  Then I instilled heparinized saline in this patched artery and then completed the patch angioplasty in the usual fashion.  First, I released the clamp on the external carotid artery, then I released it on the common carotid artery.  After waiting a few seconds, I then released it on the internal carotid artery.   I then interrogated this patient's arteries with the continuous Doppler.  The audible waveforms in each artery were not consistent with the expected characteristics for each artery.  The proximal common carotid artery, external carotid artery and internal carotid artery had appropriate signals but the distal common carotid artery signal was a waterhammer signal.  This was not consistent with  the strongly palpable pulse in the distal common carotid artery .    The Sonosite probe was then sterilely draped and used to interrogate the carotid artery in both longitudinal and transverse views.  There was no obstruction and not obvious intimal flap or thrombus.  At this point, I washed out the wound, and placed Avitene.  I also gave the patient 50 mg of protamine to reverse his anticoagulation.   After waiting a few minutes, I removed the Avitene out the wound.  There continued to be an ooze from the vessels  adjacent to the vagus nerve.  I reapplied the Avitene, but this ooze continued.  I elected to place a TLS drain.  Using the trocar I routed the TLS drain through inferior subcutaneous tissue.  I secured the drain in place with a 3-0 Nylon tied to the drain.  I shortened the drain length for the dimensions of this dissection.  I removed the Avitene.  There was a slow ooze from the nerve.  I placed the drain next to the nerve.  I then reapproximated the platysma muscle with a running stitch of 3-0 Vicryl.  The skin was then reapproximated with a running subcuticular 4-0 Monocryl stitch.  The skin was then cleaned, dried and Dermabond was used to reinforce the skin closure.  The patient woke without any problems, neurologically intact.      COMPLICATIONS: none  CONDITION: stable   Adele Barthel, MD [facility] Office: [phone] Pager: [phone]  05/06/2015, 2:16 PM

## 2015-05-06 NOTE — Progress Notes (Signed)
16 fr. Foley catheter inserted; 1400 ml clear, yellow urine returned. Tolerated and verbalized relief.

## 2015-05-06 NOTE — Anesthesia Preprocedure Evaluation (Signed)
Anesthesia Evaluation  Patient identified by MRN, date of birth, ID band Patient awake    Reviewed: Allergy & Precautions, NPO status , Patient's Chart, lab work & pertinent test results  Airway Mallampati: II  TM Distance: >3 FB Neck ROM: Full    Dental  (+) Teeth Intact, Dental Advisory Given   Pulmonary former smoker,    breath sounds clear to auscultation       Cardiovascular hypertension,  Rhythm:Regular Rate:Normal     Neuro/Psych    GI/Hepatic   Endo/Other  diabetes  Renal/GU      Musculoskeletal   Abdominal   Peds  Hematology   Anesthesia Other Findings   Reproductive/Obstetrics                             Anesthesia Physical Anesthesia Plan  ASA: III  Anesthesia Plan: General   Post-op Pain Management:    Induction: Intravenous  Airway Management Planned: Oral ETT  Additional Equipment: Arterial line  Intra-op Plan:   Post-operative Plan: Extubation in OR  Informed Consent: I have reviewed the patients History and Physical, chart, labs and discussed the procedure including the risks, benefits and alternatives for the proposed anesthesia with the patient or authorized representative who has indicated his/her understanding and acceptance.   Dental advisory given  Plan Discussed with: CRNA and Anesthesiologist  Anesthesia Plan Comments:         Anesthesia Quick Evaluation  

## 2015-05-06 NOTE — Anesthesia Procedure Notes (Signed)
Procedure Name: Intubation Date/Time: 05/06/2015 11:22 AM Performed by: Merrilyn Puma B Pre-anesthesia Checklist: Patient identified, Emergency Drugs available, Timeout performed, Suction available and Patient being monitored Patient Re-evaluated:Patient Re-evaluated prior to inductionOxygen Delivery Method: Circle system utilized Preoxygenation: Pre-oxygenation with 100% oxygen Intubation Type: IV induction Ventilation: Mask ventilation without difficulty and Oral airway inserted - appropriate to patient size Laryngoscope Size: Mac and 4 Grade View: Grade III Tube type: Oral Tube size: 7.5 mm Number of attempts: 1 Airway Equipment and Method: Stylet Placement Confirmation: CO2 detector,  positive ETCO2 and breath sounds checked- equal and bilateral Secured at: 22 cm Tube secured with: Tape Dental Injury: Teeth and Oropharynx as per pre-operative assessment

## 2015-05-06 NOTE — Anesthesia Postprocedure Evaluation (Signed)
Anesthesia Post Note  Patient: Jeremy Patton  Procedure(s) Performed: Procedure(s) (LRB): Right Carotid ENDARTERECTOMY  (Right) PATCH ANGIOPLASTY (Right)  Patient location during evaluation: PACU Anesthesia Type: General Level of consciousness: awake, awake and alert and oriented Pain management: pain level controlled Respiratory status: nonlabored ventilation and spontaneous breathing Cardiovascular status: blood pressure returned to baseline Postop Assessment: no headache Anesthetic complications: no    Last Vitals:  Filed Vitals:   05/06/15 1725 05/06/15 1747  BP:  112/56  Pulse: 71 70  Temp:  36.4 C  Resp: 15 14    Last Pain:  Filed Vitals:   05/06/15 1759  PainSc: 3                  Katelee Schupp COKER

## 2015-05-06 NOTE — Care Management Note (Signed)
Case Management Note  Patient Details  Name: Jeremy Patton MRN: [id number] Date of Birth: [birth year]  Subjective/Objective:    Patient from home with spouse, s/p right carotid endarterectomy, NCM will continue to follow for dc needs.                Action/Plan: right carotid endarterectomy  Expected Discharge Date:                  Expected Discharge Plan:  [place]  In-House Referral:     Discharge planning Services  CM Consult  Post Acute Care Choice:    Choice offered to:     DME Arranged:    DME Agency:     HH Arranged:    HH Agency:     Status of Service:  In process, will continue to follow  Medicare Important Message Given:    Date Medicare IM Given:    Medicare IM give by:    Date Additional Medicare IM Given:    Additional Medicare Important Message give by:     If discussed at [place] of Stay Meetings, dates discussed:    Additional Comments:  Jeremy Mayo, RN 05/06/2015, 5:51 PM

## 2015-05-06 NOTE — Interval H&P Note (Signed)
[facility] [place] [facility]  History and Physical Update  The patient was interviewed and re-examined.  The patient's previous History and Physical has been reviewed and is unchanged from my consult.  There is no change in the plan of care: R CEA.  Pt has Bilateral ICA string signs and severe disease in both ICA.  The R ICA looks marginally worse on CTA, so will proceed with R CEA.  I discussed with the patient the risks, benefits, and alternatives to carotid endarterectomy.  I discussed the procedural details of carotid endarterectomy with the patient.  The patient is aware that the risks of carotid endarterectomy include but are not limited to: bleeding, infection, stroke, myocardial infarction, death, cranial nerve injuries both temporary and permanent, neck hematoma, possible airway compromise, labile blood pressure post-operatively, cerebral hyperperfusion syndrome, and possible need for additional interventions in the future. The patient is aware of the risks and agrees to proceed forward with the procedure.   Adele Barthel, MD [facility] [place] Office: [phone] Pager: [phone]  05/06/2015, 10:41 AM

## 2015-05-07 ENCOUNTER — Telehealth: Payer: Self-pay

## 2015-05-07 ENCOUNTER — Encounter (HOSPITAL_COMMUNITY): Payer: Self-pay | Admitting: Vascular Surgery

## 2015-05-07 LAB — BASIC METABOLIC PANEL
ANION GAP: 8 (ref 5–15)
BUN: 13 mg/dL (ref 6–20)
CALCIUM: 8 mg/dL — AB (ref 8.9–10.3)
CO2: 23 mmol/L (ref 22–32)
Chloride: 108 mmol/L (ref 101–111)
Creatinine, Ser: 1.03 mg/dL (ref 0.61–1.24)
GFR calc Af Amer: >60 mL/min (ref >60)
GFR calc non Af Amer: >60 mL/min (ref >60)
GLUCOSE: 156 mg/dL — AB (ref 65–99)
Potassium: 4.1 mmol/L (ref 3.5–5.1)
Sodium: 139 mmol/L (ref 135–145)

## 2015-05-07 LAB — CBC
HEMATOCRIT: 33 % — AB (ref 39.0–52.0)
Hemoglobin: 10.5 g/dL — ABNORMAL LOW (ref 13.0–17.0)
MCH: 29 pg (ref 26.0–34.0)
MCHC: 31.8 g/dL (ref 30.0–36.0)
MCV: 91.2 fL (ref 78.0–100.0)
PLATELETS: 150 10*3/uL (ref 150–400)
RBC: 3.62 MIL/uL — ABNORMAL LOW (ref 4.22–5.81)
RDW: 13.5 % (ref 11.5–15.5)
WBC: 7.5 10*3/uL (ref 4.0–10.5)

## 2015-05-07 MED ORDER — OXYCODONE HCL 5 MG PO TABS: 5.0000 mg | ORAL_TABLET | Freq: Four times a day (QID) | ORAL | Status: DC | PRN

## 2015-05-07 NOTE — Telephone Encounter (Signed)
Phone call from pt.  Reported that he submitted his Rx for pain to the Pharmacy to get it filled.  Was notified by the Pharmacist that the Rx didn't get signed.  Advised the Pharmacy would require a new Rx be written, since it is a controlled substance.  Pt. Stated "I don't think I'm going to need it; I think I'll try ES Tylenol and see if I can get by on it."  Advised to call office if feels he needs something stronger.  Verb. Understanding.

## 2015-05-07 NOTE — Progress Notes (Signed)
Discussed and explained d/c instructions, f/u appt, prescription given to patient. Pt going home with wife via w/c with belongings.

## 2015-05-07 NOTE — Care Management Note (Signed)
Case Management Note  Patient Details  Name: Jeremy Patton MRN: [id number] Date of Birth: [birth year]  Subjective/Objective:       s/p right carotid endarterectomy             Action/Plan: Discharge Planning: AVS reviewed   NCM spoke to pt and wife at bedside. Wife will be at home to assist with his care. Can afford his medications at home. No DME needed. States he is ambulatory at home.   PCP- Asencion Noble MD  Expected Discharge Date:  05/07/2015              Expected Discharge Plan:  [place]  In-House Referral:  NA  Discharge planning Services  CM Consult  Post Acute Care Choice:  NA Choice offered to:  NA  DME Arranged:  N/A DME Agency:  NA  HH Arranged:  NA HH Agency:  NA  Status of Service:  Completed, signed off  Medicare Important Message Given:    Date Medicare IM Given:    Medicare IM give by:    Date Additional Medicare IM Given:    Additional Medicare Important Message give by:     If discussed at [place] of Stay Meetings, dates discussed:    Additional Comments:  Erenest Rasher, RN 05/07/2015, 9:45 AM

## 2015-05-07 NOTE — Progress Notes (Addendum)
[facility] [place]  Subjective  - Doing well.  Needs to void prior to going home.   Objective 110/48 80 98 F (36.7 C) (Oral) 14 98%  Intake/Output Summary (Last 24 hours) at 05/07/15 0750 Last data filed at 05/07/15 0701  Gross per 24 hour  Intake   4970 ml  Output   4240 ml  Net    730 ml    Grip 5/5 B Incision C/D without hematoma No tongue deviation and smile is symmetric Drain out put 40 cc since 2 am, watery SS drainage in tube  Assessment/Planning: POD #1 right CEA  He has not voided yet today.  Will discharge later today F/U in 2 weeks with Dr. Cloyde Reams, [place] 05/07/2015 7:50 AM --  Laboratory Lab Results:  Recent Labs  05/06/15 1900 05/07/15 0355  WBC 7.4 7.5  HGB 10.6* 10.5*  HCT 32.6* 33.0*  PLT 141* 150   BMET  Recent Labs  05/06/15 1900 05/07/15 0355  NA  --  139  K  --  4.1  CL  --  108  CO2  --  23  GLUCOSE  --  156*  BUN  --  13  CREATININE 0.99 1.03  CALCIUM  --  8.0*    COAG Lab Results  Component Value Date   INR 1.02 05/05/2015   No results found for: PTT  Addendum  I have independently interviewed and examined the patient, and I agree with the physician assistant's findings.  Ok to D/C TLS.  Normal neuro exam.  No hematoma.  D/C after voiding  Adele Barthel, MD [facility] [place] Office: [phone] Pager: [phone]  05/07/2015, 9:37 AM

## 2015-05-08 ENCOUNTER — Ambulatory Visit (INDEPENDENT_AMBULATORY_CARE_PROVIDER_SITE_OTHER): Payer: Medicare PPO | Admitting: Ophthalmology

## 2015-05-08 ENCOUNTER — Telehealth: Payer: Self-pay | Admitting: Vascular Surgery

## 2015-05-08 NOTE — Telephone Encounter (Signed)
-----   Message from Mena Goes, RN sent at 05/07/2015  9:13 AM EDT ----- Regarding: Schedule   ----- Message -----    From: Ulyses Amor, PA-C    Sent: 05/07/2015   7:52 AM      To: Vvs Charge Pool  F/U in 2 weeks with Dr. Bridgett Larsson s/p right CEA

## 2015-05-08 NOTE — Discharge Summary (Signed)
DISCHARGE SUMMARY   Patient ID:  Jeremy Patton MRN: [id number] DOB/AGE: May 09, 1940 75 y.o.  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 Diabetes mellitus without complication ([place]) XX123456  . Hyperlipidemia 11/29/2011  . Low back pain 11/29/2011  . Lumbar radiculopathy 11/29/2011    Left  . Hypertension 11/29/2011  . Pain of left lower leg   . Carotid artery occlusion     Bruit, Right  . GERD (gastroesophageal reflux disease)   . Wears glasses     Procedure(s): Right Carotid ENDARTERECTOMY  PATCH ANGIOPLASTY  Discharged Condition: good  HPI: Jeremy Patton is a 75 y.o. (03-16-1940) male who presents with chief complaint: follow up neck CTA.Marland Kitchen Previous carotid studies demonstrated: RICA [id number] stenosis, LICA [id number] stenosis. Patient has no history of TIA or stroke symptom. The patient has never had amaurosis fugax or monocular blindness. The patient has never had facial drooping or hemiplegia. The patient has never had receptive or expressive aphasia. The patient notes sx c/w orthostatic hypotension. The patient's risks factors for carotid disease include: DM, HTN, HLD and prior smoking.    Hospital Course:  Jeremy Patton is a 75 y.o. male is S/P  Procedure(s): Right Carotid ENDARTERECTOMY  PATCH ANGIOPLASTY Uneventful stay ambulating, voided and tolerating PO's  Consults:     Significant Diagnostic Studies: CBC Lab Results  Component Value Date   WBC 7.5 05/07/2015   HGB 10.5* 05/07/2015   HCT 33.0* 05/07/2015   MCV 91.2 05/07/2015   PLT 150 05/07/2015    BMET    Component Value Date/Time   NA 139 05/07/2015 0355   NA 139 04/30/2015 0813   K 4.1 05/07/2015 0355   CL 108  05/07/2015 0355   CO2 23 05/07/2015 0355   GLUCOSE 156* 05/07/2015 0355   GLUCOSE 219* 04/30/2015 0813   BUN 13 05/07/2015 0355   BUN 26 04/30/2015 0813   CREATININE 1.03 05/07/2015 0355   CREATININE 1.31* 04/25/2015 1245   CALCIUM 8.0* 05/07/2015 0355   GFRNONAA >60 05/07/2015 0355   GFRAA >60 05/07/2015 0355   COAG Lab Results  Component Value Date   INR 1.02 05/05/2015     Disposition:  Discharge to :Home Discharge Instructions    Call MD for:  redness, tenderness, or signs of infection (pain, swelling, bleeding, redness, odor or green/yellow discharge around incision site)    Complete by:  As directed      Call MD for:  severe or increased pain, loss or decreased feeling  in affected limb(s)    Complete by:  As directed      Call MD for:  temperature >100.5    Complete by:  As directed      Discharge instructions    Complete by:  As directed   He may shower 24 hours after the drain was removed from his neck.     Driving Restrictions    Complete by:  As directed   No driving for 1 week     Increase activity slowly    Complete by:  As directed   Walk with assistance use walker or cane as needed     Lifting restrictions    Complete by:  As directed   No heavy lifting for 6 weeks     Resume  previous diet    Complete by:  As directed             Medication List    TAKE these medications        acetaminophen 500 MG tablet  Commonly known as:  TYLENOL  Take 1,000 mg by mouth every 6 (six) hours as needed for moderate pain.     aspirin 81 MG tablet  Take 81 mg by mouth daily.     chlorproMAZINE 25 MG tablet  Commonly known as:  THORAZINE  Take 25 mg by mouth 4 (four) times daily as needed for nausea.     gabapentin 300 MG capsule  Commonly known as:  NEURONTIN  300 mg 2 (two) times daily.     LANTUS 100 UNIT/ML injection  Generic drug:  insulin glargine  Inject 30 Units into the skin every morning.     lisinopril-hydrochlorothiazide 20-12.5 MG tablet   Commonly known as:  PRINZIDE,ZESTORETIC  Take 2 tablets by mouth daily.     metFORMIN 1000 MG tablet  Commonly known as:  GLUCOPHAGE  Take 1,000 mg by mouth 2 (two) times daily with a meal.     NOVOLOG FLEXPEN [place]  Inject 3 Units into the skin daily.     oxyCODONE 5 MG immediate release tablet  Commonly known as:  Oxy IR/ROXICODONE  Take 1 tablet (5 mg total) by mouth every 6 (six) hours as needed for moderate pain.     pantoprazole 40 MG tablet  Commonly known as:  PROTONIX  Take 40 mg by mouth daily.     pioglitazone 15 MG tablet  Commonly known as:  ACTOS  Take 15 mg by mouth daily.     rosuvastatin 20 MG tablet  Commonly known as:  CRESTOR  Take 1 tablet (20 mg total) by mouth daily.     tamsulosin 0.4 MG Caps capsule  Commonly known as:  FLOMAX  Take 0.4 mg by mouth daily.     verapamil 180 MG CR tablet  Commonly known as:  CALAN-SR  Take 180 mg by mouth 2 (two) times daily.       Verbal and written Discharge instructions given to the patient. Wound care per Discharge AVS     Follow-up Information    Follow up with Adele Barthel, MD In 2 weeks.   Specialties:  Vascular Surgery, Cardiology   Why:  Office will call you to arrange your appt (sent)   Contact information:   [place] [id number] [phone]       Signed: Laurence Slate [facility] 05/08/2015, 9:49 AM  Addendum  I have independently interviewed and examined the patient, and I agree with the physician assistant's discharge summary.  This patient underwent R CEA for radiographic string sign.  His L ICA also has a string sign.  His R CEA was uneventful except unusual intraoperative doppler signal in distal common carotid artery.  His physical exam and carotid duplex, however, were not consistent with distal common carotid artery occlusion.  The patient's recovery post-operatively was uneventful except urinary retention that require foley placement overnight.  He was able to void on POD #1.   The patient will follow up in the office in 2 weeks.  If he has adequately recovered, will scheduled L CEA at that time.  Adele Barthel, MD [facility] [place] Office: [phone] Pager: [phone]  05/08/2015, 11:36 AM   --- For VQI Registry use --- Instructions: Press F2 to tab through selections.  Delete question if not applicable.   Modified Rankin score at D/C (0-6): Rankin Score=0  IV medication needed for:  1. Hypertension: No 2. Hypotension: No  Post-op Complications: No  1. Post-op CVA or TIA: No  If yes: Event classification (right eye, left eye, right cortical, left cortical, verterobasilar, other):   If yes: Timing of event (intra-op, <6 hrs post-op, >=6 hrs post-op, unknown):   2. CN injury: No  If yes: CN  injuried   3. Myocardial infarction: No  If yes: Dx by (EKG or clinical, Troponin):   4.  CHF: No  5.  Dysrhythmia (new): No  6. Wound infection: No  7. Reperfusion symptoms: No  8. Return to OR: No  If yes: return to OR for (bleeding, neurologic, other CEA incision, other):   Discharge medications: Statin use:  Yes ASA use:  Yes Beta blocker use:  No  for medical reason 0 ACE-Inhibitor use:  Yes P2Y12 Antagonist use: [ ]  None, [ ]  Plavix, [ ]  Plasugrel, [ ]  Ticlopinine, [ ]  Ticagrelor, [ ]  Other, [ ]  No for medical reason, [ ]  Non-compliant, [ ]  Not-indicated Anti-coagulant use:  [ ]  None, [ ]  Warfarin, [ ]  Rivaroxaban, [ ]  Dabigatran, [ ]  Other, [ ]  No for medical reason, [ ]  Non-compliant, [ ]  Not-indicated

## 2015-05-08 NOTE — Telephone Encounter (Signed)
Spoke with pt to schedule, dpm °

## 2015-05-13 ENCOUNTER — Telehealth: Payer: Self-pay

## 2015-05-13 NOTE — Telephone Encounter (Signed)
Phone call from pt.  Reported that he has felt very tired recently, and having bouts of dizziness when he changes position.  Stated the dizziness is not a new symptom.  Reported the dizziness with position change was occurring before his carotid surgery.  Reported the dizziness improves after a few minutes.  Reported he thinks he is staying hydrated; is drinking water and diet pop.  Encouraged to avoid drinking a lot of caffeinated beverages, as it may cause some dehydration.  Stated has not been taking any Oxycodone, since he really isn't having any pain; only using Tylenol.  Denied any recent medication change.  Stated the right neck incision looks okay.  Reported he is swallowing without difficulty.  C/o a sore throat.  Enc. to do a gentle gargle with warm salt water, sip on hot tea with honey, and to use lozenges to soothe the throat.  Advised pt. to notify PCP re: dizziness with position change.  Agreed.  Will report fatigue to Dr. Bridgett Larsson, as pt's pre-op Hgb was 10.6.

## 2015-05-14 ENCOUNTER — Encounter: Payer: Self-pay | Admitting: Vascular Surgery

## 2015-05-14 NOTE — Telephone Encounter (Signed)
RE: c/o fatigue; noted low Hgb. pre op  Received: Today    Conrad [place], MD  Joline Salt Ashleymarie Granderson, RN           Dizziness unlikely due to carotid dizzy other than side effect of narcotic. This patient has vertebral artery disease so that might be contributing. Unfortunately, dizziness has multiple causes. He should be referred to his PCP for the dizziness and anemia. A significant bleeding would show up as an hematoma. He had minimal blood loss intraop.      Phone call to pt. and advised of Dr. Lianne Moris recommendation to contact his PCP re: dizziness and anemia.  Will fax pre-op hgb. and triage note to Dr. Ria Comment office.  Pt. advised to call and make appt. with PCP.  Verb. understanding.

## 2015-05-22 ENCOUNTER — Ambulatory Visit (INDEPENDENT_AMBULATORY_CARE_PROVIDER_SITE_OTHER): Payer: Medicare Other | Admitting: Vascular Surgery

## 2015-05-22 ENCOUNTER — Encounter: Payer: Self-pay | Admitting: Vascular Surgery

## 2015-05-22 ENCOUNTER — Ambulatory Visit (INDEPENDENT_AMBULATORY_CARE_PROVIDER_SITE_OTHER): Payer: Medicare Other | Admitting: Ophthalmology

## 2015-05-22 VITALS — BP 127/66 | HR 77 | Temp 97.0°F | Resp 16 | Ht 71.0 in | Wt 186.0 lb

## 2015-05-22 DIAGNOSIS — H43813 Vitreous degeneration, bilateral: Secondary | ICD-10-CM

## 2015-05-22 DIAGNOSIS — E11319 Type 2 diabetes mellitus with unspecified diabetic retinopathy without macular edema: Secondary | ICD-10-CM

## 2015-05-22 DIAGNOSIS — H35033 Hypertensive retinopathy, bilateral: Secondary | ICD-10-CM | POA: Diagnosis not present

## 2015-05-22 DIAGNOSIS — E113593 Type 2 diabetes mellitus with proliferative diabetic retinopathy without macular edema, bilateral: Secondary | ICD-10-CM | POA: Diagnosis not present

## 2015-05-22 DIAGNOSIS — I739 Peripheral vascular disease, unspecified: Principal | ICD-10-CM

## 2015-05-22 DIAGNOSIS — I10 Essential (primary) hypertension: Secondary | ICD-10-CM

## 2015-05-22 DIAGNOSIS — I779 Disorder of arteries and arterioles, unspecified: Secondary | ICD-10-CM

## 2015-05-22 NOTE — Progress Notes (Signed)
Postoperative Visit   History of Present Illness  Jeremy Patton is a 75 y.o. male who presents for postoperative follow-up for: R CEA (Date: 05/06/15).  The patient's neck incision is healed.  The patient has had no stroke or TIA symptoms.  Pt notes some mild right jaw line numbness and resolving R headache.    Past Medical History  Diagnosis Date  . Diabetes mellitus without complication ([place]) [id number]  . Hyperlipidemia 11/29/2011  . Low back pain 11/29/2011  . Lumbar radiculopathy 11/29/2011    Left  . Hypertension 11/29/2011  . Pain of left lower leg   . Carotid artery occlusion     Bruit, Right  . GERD (gastroesophageal reflux disease)   . Wears glasses     Past Surgical History  Procedure Laterality Date  . Colonoscopy  08/22/2005 and  07/31/2009    minimal internal hemorrhoids, left-sided diverticulosis  . Tonsillectomy    . Circumcision      Age 87  . Colonoscopy N/A 11/25/2014    Procedure: COLONOSCOPY;  Surgeon: Daneil Dolin, MD;  [facility];  Service: Endoscopy;  Laterality: N/A;  1030  . Esophagogastroduodenoscopy N/A 11/25/2014    Procedure: ESOPHAGOGASTRODUODENOSCOPY (EGD);  Surgeon: Daneil Dolin, MD;  [facility];  Service: Endoscopy;  Laterality: N/A;  . Esophageal dilation N/A 11/25/2014    Procedure: ESOPHAGEAL DILATION;  Surgeon: Daneil Dolin, MD;  [facility];  Service: Endoscopy;  Laterality: N/A;  . Cataract extraction w/ intraocular lens  implant, bilateral    . Multiple tooth extractions    . Endarterectomy Right 05/06/2015    Procedure: Right Carotid ENDARTERECTOMY ;  Surgeon: Conrad Fairmount, MD;  Location: [facility];  Service: Vascular;  Laterality: Right;  . Patch angioplasty Right 05/06/2015    Procedure: PATCH ANGIOPLASTY;  Surgeon: Conrad [place], MD;  Location: [facility];  Service: Vascular;  Laterality: Right;    Social History   Social History  . Marital Status: Married    Spouse Name: N/A  . Number of  Children: N/A  . Years of Education: N/A   Occupational History  . Not on file.   Social History Main Topics  . Smoking status: Former Smoker -- 30 years    Types: Cigarettes    Quit date: 12/17/1991  . Smokeless tobacco: Never Used  . Alcohol Use: No  . Drug Use: No  . Sexual Activity: Not on file   Other Topics Concern  . Not on file   Social History Narrative    Family History  Problem Relation Age of Onset  . Colon cancer Brother     Age 28  . Stroke Mother   . Heart disease Mother     before age 38    Current Outpatient Prescriptions  Medication Sig Dispense Refill  . acetaminophen (TYLENOL) 500 MG tablet Take 1,000 mg by mouth every 6 (six) hours as needed for moderate pain.    Marland Kitchen aspirin 81 MG tablet Take 81 mg by mouth daily.    . chlorproMAZINE (THORAZINE) 25 MG tablet Take 25 mg by mouth 4 (four) times daily as needed for nausea. Reported on 05/22/2015    . gabapentin (NEURONTIN) 300 MG capsule 300 mg 2 (two) times daily.     . Insulin Aspart (NOVOLOG FLEXPEN ) Inject 3 Units into the skin daily.     Marland Kitchen LANTUS 100 UNIT/ML injection Inject 30 Units into the skin every morning.     Marland Kitchen  lisinopril-hydrochlorothiazide (PRINZIDE,ZESTORETIC) 20-12.5 MG per tablet Take 2 tablets by mouth daily.     . metFORMIN (GLUCOPHAGE) 1000 MG tablet Take 1,000 mg by mouth 2 (two) times daily with a meal.     . pantoprazole (PROTONIX) 40 MG tablet Take 40 mg by mouth daily.     . pioglitazone (ACTOS) 15 MG tablet Take 15 mg by mouth daily.     . rosuvastatin (CRESTOR) 20 MG tablet Take 1 tablet (20 mg total) by mouth daily. 30 tablet 6  . Tamsulosin HCl (FLOMAX) 0.4 MG CAPS Take 0.4 mg by mouth daily.     . verapamil (CALAN-SR) 180 MG CR tablet Take 180 mg by mouth 2 (two) times daily.    Marland Kitchen oxyCODONE (OXY IR/ROXICODONE) 5 MG immediate release tablet Take 1 tablet (5 mg total) by mouth every 6 (six) hours as needed for moderate pain. (Patient not taking: Reported on 05/22/2015) 20  tablet 0   No current facility-administered medications for this visit.     No Known Allergies   REVIEW OF SYSTEMS:  (Positives checked otherwise negative)  CARDIOVASCULAR:   [ ]  chest pain,  [ ]  chest pressure,  [ ]  palpitations,  [ ]  shortness of breath when laying flat,  [ ]  shortness of breath with exertion,   [ ]  pain in feet when walking,  [ ]  pain in feet when laying flat, [ ]  history of blood clot in veins (DVT),  [ ]  history of phlebitis,  [ ]  swelling in legs,  [ ]  varicose veins  PULMONARY:   [ ]  productive cough,  [ ]  asthma,  [ ]  wheezing  NEUROLOGIC:   [ ]  weakness in arms or legs,  [ ]  numbness in arms or legs,  [ ]  difficulty speaking or slurred speech,  [ ]  temporary loss of vision in one eye,  [ ]  dizziness  HEMATOLOGIC:   [ ]  bleeding problems,  [ ]  problems with blood clotting too easily  MUSCULOSKEL:   [ ]  joint pain, [ ]  joint swelling  GASTROINTEST:   [ ]  vomiting blood,  [ ]  blood in stool     GENITOURINARY:   [ ]  burning with urination,  [ ]  blood in urine  PSYCHIATRIC:   [ ]  history of major depression  INTEGUMENTARY:   [ ]  rashes,  [ ]  ulcers  CONSTITUTIONAL:   [ ]  fever,  [ ]  chills     Physical Examination  Filed Vitals:   05/22/15 1509 05/22/15 1510  BP: 121/56 127/66  Pulse: 77 77  Temp: 97 F (36.1 C)   Resp: 16    Pulmonary: Sym exp, good air movt, CTAB, no rales, rhonchi, & wheezing  Cardiac: RRR, Nl S1, S2, no Murmurs, rubs or gallops  R Neck: Incision is healed, some dermabond still adherent  Neuro: CN 2-12 are intact , Motor strength is 5/5 bilaterally, sensation is grossly intact   Medical Decision Making  Jeremy Patton is a 75 y.o. male who presents s/p R CEA for R asx ICA stenosis >90%,  asx L ICA stenosis >90%   In regards to L ICA stenosis, the patient is scheduled for 27 APR 17, delay per pt's wishes. I discussed with the patient the risks, benefits, and alternatives to carotid  endarterectomy.   The patient is aware that the risks of carotid endarterectomy include but are not limited to: bleeding, infection, stroke, myocardial infarction, death, cranial nerve injuries both temporary and permanent, neck hematoma, possible airway  compromise, labile blood pressure post-operatively, cerebral hyperperfusion syndrome, and possible need for additional interventions in the future.  The patient is aware of the risks and agrees to proceed forward with the procedure.  The patient's neck incision is healing with no stroke symptoms. I discussed in depth with the patient the nature of atherosclerosis, and emphasized the importance of maximal medical management including strict control of blood pressure, blood glucose, and lipid levels, obtaining regular exercise, anti-platelet use and cessation of smoking.   The patient is currently on an antiplatelet: ASA. The patient is currently on a statin: Crestor. The patient is aware that without maximal medical management the underlying atherosclerotic disease process will progress, limiting the benefit of any interventions.  Thank you for allowing [place] to participate in this patient's care.  Adele Barthel, MD [facility] [place] Office: [phone] Pager: [phone]

## 2015-05-23 ENCOUNTER — Other Ambulatory Visit: Payer: Self-pay

## 2015-06-09 NOTE — Pre-Procedure Instructions (Addendum)
Jeremy Patton  06/09/2015      [facility] - [place], [place] [address] Phone: [phone] Fax: [phone]    Your procedure is scheduled on April 27.  Report to [facility] at 1000A.M.  Call this number if you have problems the morning of surgery:  [phone]   Remember:  Do not eat food or drink liquids after midnight.  Take these medicines the morning of surgery with A SIP OF WATER Tylenol if needed, chlorpromazine (Thorazine) if needed,gabapentin (Neurontin), pantoprazole (Protonix), Tamsulosin (Flomax), Verapamil (Calan-SR),aspirin per dr   Stop taking BC's, goody's, Herbal medications, Fish Oil, Aleve, Ibuprofen, Advil, Motrin, vitamins   No metformin, actos day of surgery   How to Manage Your Diabetes Before and After Surgery  Why is it important to control my blood sugar before and after surgery? . Improving blood sugar levels before and after surgery helps healing and can limit problems. . A way of improving blood sugar control is eating a healthy diet by: o  Eating less sugar and carbohydrates o  Increasing activity/exercise o  Talking with your doctor about reaching your blood sugar goals . High blood sugars (greater than 180 mg/dL) can raise your risk of infections and slow your recovery, so you will need to focus on controlling your diabetes during the weeks before surgery. . Make sure that the doctor who takes care of your diabetes knows about your planned surgery including the date and location.  How do I manage my blood sugar before surgery? . Check your blood sugar at least 4 times a day, starting 2 days before surgery, to make sure that the level is not too high or low. o Check your blood sugar the morning of your surgery when you wake up and every 2 hours until you get to the Short Stay unit. . If your blood sugar is less than 70 mg/dL, you will need to treat for low blood  sugar: o Do not take insulin. o Treat a low blood sugar (less than 70 mg/dL) with  cup of clear juice (cranberry or apple), 4 glucose tablets, OR glucose gel. o Recheck blood sugar in 15 minutes after treatment (to make sure it is greater than 70 mg/dL). If your blood sugar is not greater than 70 mg/dL on recheck, call [phone] for further instructions. . Report your blood sugar to the short stay nurse when you get to Short Stay.  . If you are admitted to the hospital after surgery: o Your blood sugar will be checked by the staff and you will probably be given insulin after surgery (instead of oral diabetes medicines) to make sure you have good blood sugar levels. o The goal for blood sugar control after surgery is 80-180 mg/dL.     WHAT DO I DO ABOUT MY DIABETES MEDICATION?   Marland Kitchen Do not take oral diabetes medicines (pills) the morning of surgery. Metformin (Glucophage) or pioglitazone (Actos)  .   THE MORNING OF SURGERY, take  15  units of Lantus insulin.  If your CBG is greater than 220 mg/dL, you may take  of your sliding scale (correction) dose of insulin.morning of surgery(novolog)    Reviewed and Endorsed by [facility] Patient Education Committee, August 2015  Do not wear jewelry, make-up or nail polish.  Do not wear lotions, powders, or perfumes.  You may wear deodorant.  Do not shave 48 hours prior to surgery.  Men may shave face and neck.  Do not bring valuables to the hospital.  [facility] is not responsible for any belongings or valuables.  Contacts, dentures or bridgework may not be worn into surgery.  Leave your suitcase in the car.  After surgery it may be brought to your room.  For patients admitted to the hospital, discharge time will be determined by your treatment team.  Patients discharged the day of surgery will not be allowed to drive home.   Special instructions:  [facility] - Preparing for Surgery  Before surgery, you can play an important role.   Because skin is not sterile, your skin needs to be as free of germs as possible.  You can reduce the number of germs on you skin by washing with CHG (chlorahexidine gluconate) soap before surgery.  CHG is an antiseptic cleaner which kills germs and bonds with the skin to continue killing germs even after washing.  Please DO NOT use if you have an allergy to CHG or antibacterial soaps.  If your skin becomes reddened/irritated stop using the CHG and inform your nurse when you arrive at Short Stay.  Do not shave (including legs and underarms) for at least 48 hours prior to the first CHG shower.  You may shave your face.  Please follow these instructions carefully:   1.  Shower with CHG Soap the night before surgery and the                                morning of Surgery.  2.  If you choose to wash your hair, wash your hair first as usual with your       normal shampoo.  3.  After you shampoo, rinse your hair and body thoroughly to remove the                      Shampoo.  4.  Use CHG as you would any other liquid soap.  You can apply chg directly       to the skin and wash gently with scrungie or a clean washcloth.  5.  Apply the CHG Soap to your body ONLY FROM THE NECK DOWN.        Do not use on open wounds or open sores.  Avoid contact with your eyes,       ears, mouth and genitals (private parts).  Wash genitals (private parts)       with your normal soap.  6.  Wash thoroughly, paying special attention to the area where your surgery        will be performed.  7.  Thoroughly rinse your body with warm water from the neck down.  8.  DO NOT shower/wash with your normal soap after using and rinsing off       the CHG Soap.  9.  Pat yourself dry with a clean towel.            10.  Wear clean pajamas.            11.  Place clean sheets on your bed the night of your first shower and do not        sleep with pets.  Day of Surgery  Do not apply any lotions/deoderants the morning of surgery.  Please wear  clean clothes to the hospital/surgery center.     Please read over the following fact sheets  that you were given. Pain Booklet, Coughing and Deep Breathing, MRSA Information and Surgical Site Infection Prevention

## 2015-06-10 ENCOUNTER — Encounter (HOSPITAL_COMMUNITY)
Admission: RE | Admit: 2015-06-10 | Discharge: 2015-06-10 | Disposition: A | Discharge status: Home / Self Care | Payer: Medicare Other | Source: Ambulatory Visit | Attending: Vascular Surgery | Admitting: Vascular Surgery

## 2015-06-10 ENCOUNTER — Encounter (HOSPITAL_COMMUNITY): Payer: Self-pay

## 2015-06-10 DIAGNOSIS — Z0183 Encounter for blood typing: Secondary | ICD-10-CM | POA: Insufficient documentation

## 2015-06-10 DIAGNOSIS — I6522 Occlusion and stenosis of left carotid artery: Secondary | ICD-10-CM | POA: Diagnosis not present

## 2015-06-10 DIAGNOSIS — Z01818 Encounter for other preprocedural examination: Secondary | ICD-10-CM | POA: Insufficient documentation

## 2015-06-10 DIAGNOSIS — Z01812 Encounter for preprocedural laboratory examination: Secondary | ICD-10-CM | POA: Diagnosis not present

## 2015-06-10 LAB — TYPE AND SCREEN
ABO/RH(D): B NEG
ANTIBODY SCREEN: NEGATIVE

## 2015-06-10 LAB — CBC
HEMATOCRIT: 37.8 % — AB (ref 39.0–52.0)
Hemoglobin: 12.3 g/dL — ABNORMAL LOW (ref 13.0–17.0)
MCH: 29.6 pg (ref 26.0–34.0)
MCHC: 32.5 g/dL (ref 30.0–36.0)
MCV: 91.1 fL (ref 78.0–100.0)
Platelets: 186 10*3/uL (ref 150–400)
RBC: 4.15 MIL/uL — ABNORMAL LOW (ref 4.22–5.81)
RDW: 13.3 % (ref 11.5–15.5)
WBC: 5.7 10*3/uL (ref 4.0–10.5)

## 2015-06-10 LAB — URINALYSIS, ROUTINE W REFLEX MICROSCOPIC
Bilirubin Urine: NEGATIVE
GLUCOSE, UA: 100 mg/dL — AB
HGB URINE DIPSTICK: NEGATIVE
Ketones, ur: NEGATIVE mg/dL
LEUKOCYTES UA: NEGATIVE
Nitrite: NEGATIVE
PH: 6 (ref 5.0–8.0)
PROTEIN: NEGATIVE mg/dL
SPECIFIC GRAVITY, URINE: 1.016 (ref 1.005–1.030)

## 2015-06-10 LAB — COMPREHENSIVE METABOLIC PANEL
ALBUMIN: 3.4 g/dL — AB (ref 3.5–5.0)
ALT: 13 U/L — ABNORMAL LOW (ref 17–63)
AST: 20 U/L (ref 15–41)
Alkaline Phosphatase: 46 U/L (ref 38–126)
Anion gap: 10 (ref 5–15)
BILIRUBIN TOTAL: 0.5 mg/dL (ref 0.3–1.2)
BUN: 19 mg/dL (ref 6–20)
CHLORIDE: 104 mmol/L (ref 101–111)
CO2: 25 mmol/L (ref 22–32)
Calcium: 9.3 mg/dL (ref 8.9–10.3)
Creatinine, Ser: 1.22 mg/dL (ref 0.61–1.24)
GFR calc Af Amer: >60 mL/min (ref >60)
GFR calc non Af Amer: 55 mL/min — ABNORMAL LOW (ref >60)
GLUCOSE: 140 mg/dL — AB (ref 65–99)
POTASSIUM: 3.7 mmol/L (ref 3.5–5.1)
Sodium: 139 mmol/L (ref 135–145)
TOTAL PROTEIN: 6.6 g/dL (ref 6.5–8.1)

## 2015-06-10 LAB — SURGICAL PCR SCREEN
MRSA, PCR: NEGATIVE
Staphylococcus aureus: NEGATIVE

## 2015-06-10 LAB — GLUCOSE, CAPILLARY: Glucose-Capillary: 129 mg/dL — ABNORMAL HIGH (ref 65–99)

## 2015-06-10 LAB — PROTIME-INR
INR: 1.06 (ref 0.00–1.49)
Prothrombin Time: 14 seconds (ref 11.6–15.2)

## 2015-06-10 LAB — APTT: APTT: 28 s (ref 24–37)

## 2015-06-11 NOTE — Progress Notes (Signed)
Anesthesia Chart Review: Patient is a 75 year old male scheduled for left CEA on 06/19/15 by Dr. Bridgett Larsson.  History includes former smoker, DM2, HLD, HTN, carotid occlusive disease, tonsillectomy, left lumbar radiculopathy. BMI 26. S/p R CEA 05/06/15  PCP is listed as Dr. Asencion Noble. Cardiologist is Dr. Shelva Majestic.   Meds include ASA 81 mg, Thorazine, Neurontin, Novolog, Lantus, lisinopril-HCTZ, metformin, Protonix, Actos, Crestor, Flomax, verapamil.   Preoperative labs reviewed.  Glucose 140. HgbA1c was 9.1 on 3./13/17.   04/29/15 EKG: NSR, LAD, consider LAFB.  05/01/15 Echo: Study Conclusions - Left ventricle: The cavity size was normal. Systolic function was normal. The estimated ejection fraction was in the range of 55% to 60%. Wall motion was normal; there were no regional wall motion abnormalities. Left ventricular diastolic function parameters were normal. - Left atrium: The atrium was mildly dilated.  05/01/15 Nuclear stress test:   The left ventricular ejection fraction is normal (55-65%).  Nuclear stress EF: 56%.  There was no ST segment deviation noted during stress.  No T wave inversion was noted during stress.  Defect 1: There is a large defect of moderate severity present in the basal inferior, basal inferolateral, mid inferior, mid inferolateral, apical inferior and apical lateral location. This is likely diaphragmatic attenuation given the normal wall motion. However, cannot rule out prior infarct. There is no ischemia.  This is a low risk study. Dr. Evette Georges note 05/05/15 states large defect not associated with any wall motion abnormality on echo and it's possible this is an exaggerated attenuation artifact.   04/25/15 Carotid U/S: 1. 80-99% stenosis RICA, suspect near occlusion d/t minimal diastolic flow in the CCA and markedly dampened waveforms in the distal ICA.  2. RECA > 50% stenosis. 3. [id number] LICA stenosis, however plaque is calcific and cannot be thoroughly  evaluated. 4. Bilateral VA flow antegrade.  04/28/15 CTA neck: IMPRESSION: 1. 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 Despite these both internal carotid arteries remain patent, although the right ICA caliber is decreased distal to this stenosis. 2. Bilateral vertebral artery atherosclerosis. Mild right vertebral artery origin stenosis. Moderate to severe left vertebral artery origin stenosis due to calcified plaque. 3. No acute findings in the neck.  Pt was cleared by Dr. Claiborne Billings for R CEA 05/06/15 and pt tolerated that surgery without issue.   If no changes, I anticipate pt can proceed with surgery as scheduled.   Willeen Cass, FNP-BC [facility] Short Stay Surgical Center/Anesthesiology Phone: [phone] 06/11/2015 9:43 AM

## 2015-06-18 ENCOUNTER — Ambulatory Visit: Payer: Medicare Other | Admitting: Cardiovascular Disease

## 2015-06-19 ENCOUNTER — Other Ambulatory Visit: Payer: Self-pay

## 2015-06-19 NOTE — Progress Notes (Signed)
Anesthesia follow-up: See 06/11/15 anesthesia note by Willeen Cass, FNP-BC. Left CEA was initially scheduled for 06/19/15, but Dr. Bridgett Larsson had a rescheduled procedure to 06/25/15 due to an operative emergency. [facility] RN Jeremy Patton called to discuss needed preoperative labs. His labs are from 06/10/15 which will be 53 days old. There were no significant lab abnormalities then, so I think it would be acceptable for him to have an ISTAT8 and T&S on arrival.   Jeremy Patton [facility] Short Stay Center/Anesthesiology Phone [phone] 06/19/2015 10:54 AM

## 2015-06-24 ENCOUNTER — Encounter (HOSPITAL_COMMUNITY): Payer: Self-pay | Admitting: *Deleted

## 2015-06-25 ENCOUNTER — Inpatient Hospital Stay (HOSPITAL_COMMUNITY)
Admission: RE | Admit: 2015-06-25 | Discharge: 2015-06-26 | DRG: 039 | Disposition: A | Discharge status: Home / Self Care | Payer: Medicare Other | Source: Ambulatory Visit | Attending: Vascular Surgery | Admitting: Vascular Surgery

## 2015-06-25 ENCOUNTER — Encounter (HOSPITAL_COMMUNITY)
Admission: RE | Disposition: A | Discharge status: Home / Self Care | Payer: Self-pay | Source: Ambulatory Visit | Attending: Vascular Surgery

## 2015-06-25 ENCOUNTER — Inpatient Hospital Stay (HOSPITAL_COMMUNITY): Payer: Medicare Other | Admitting: Vascular Surgery

## 2015-06-25 ENCOUNTER — Inpatient Hospital Stay (HOSPITAL_COMMUNITY): Payer: Medicare Other | Admitting: Emergency Medicine

## 2015-06-25 ENCOUNTER — Encounter (HOSPITAL_COMMUNITY): Payer: Self-pay | Admitting: *Deleted

## 2015-06-25 DIAGNOSIS — E785 Hyperlipidemia, unspecified: Secondary | ICD-10-CM | POA: Diagnosis present

## 2015-06-25 DIAGNOSIS — I6522 Occlusion and stenosis of left carotid artery: Secondary | ICD-10-CM | POA: Diagnosis present

## 2015-06-25 DIAGNOSIS — Z87891 Personal history of nicotine dependence: Secondary | ICD-10-CM

## 2015-06-25 DIAGNOSIS — E119 Type 2 diabetes mellitus without complications: Secondary | ICD-10-CM | POA: Diagnosis present

## 2015-06-25 DIAGNOSIS — M5416 Radiculopathy, lumbar region: Secondary | ICD-10-CM | POA: Diagnosis present

## 2015-06-25 DIAGNOSIS — Z7982 Long term (current) use of aspirin: Secondary | ICD-10-CM | POA: Diagnosis not present

## 2015-06-25 DIAGNOSIS — I959 Hypotension, unspecified: Secondary | ICD-10-CM | POA: Diagnosis not present

## 2015-06-25 DIAGNOSIS — Z9842 Cataract extraction status, left eye: Secondary | ICD-10-CM | POA: Diagnosis not present

## 2015-06-25 DIAGNOSIS — K219 Gastro-esophageal reflux disease without esophagitis: Secondary | ICD-10-CM | POA: Diagnosis present

## 2015-06-25 DIAGNOSIS — I10 Essential (primary) hypertension: Secondary | ICD-10-CM | POA: Diagnosis present

## 2015-06-25 DIAGNOSIS — Z9841 Cataract extraction status, right eye: Secondary | ICD-10-CM

## 2015-06-25 DIAGNOSIS — Z961 Presence of intraocular lens: Secondary | ICD-10-CM | POA: Diagnosis present

## 2015-06-25 DIAGNOSIS — Z794 Long term (current) use of insulin: Secondary | ICD-10-CM

## 2015-06-25 DIAGNOSIS — T457X5A Adverse effect of anticoagulant antagonists, vitamin K and other coagulants, initial encounter: Secondary | ICD-10-CM | POA: Diagnosis not present

## 2015-06-25 HISTORY — PX: ENDARTERECTOMY: SHX5162

## 2015-06-25 HISTORY — PX: PATCH ANGIOPLASTY: SHX6230

## 2015-06-25 LAB — GLUCOSE, CAPILLARY
GLUCOSE-CAPILLARY: 93 mg/dL (ref 65–99)
GLUCOSE-CAPILLARY: 115 mg/dL — AB (ref 65–99)
GLUCOSE-CAPILLARY: 194 mg/dL — AB (ref 65–99)
Glucose-Capillary: 113 mg/dL — ABNORMAL HIGH (ref 65–99)
Glucose-Capillary: 68 mg/dL (ref 65–99)
Glucose-Capillary: 61 mg/dL — ABNORMAL LOW (ref 65–99)

## 2015-06-25 LAB — TYPE AND SCREEN
ABO/RH(D): B NEG
Antibody Screen: NEGATIVE

## 2015-06-25 LAB — POCT I-STAT, CHEM 8
BUN: 23 mg/dL — AB (ref 6–20)
CALCIUM ION: 1.21 mmol/L (ref 1.13–1.30)
CHLORIDE: 103 mmol/L (ref 101–111)
CREATININE: 1.1 mg/dL (ref 0.61–1.24)
GLUCOSE: 123 mg/dL — AB (ref 65–99)
HCT: 39 % (ref 39.0–52.0)
Hemoglobin: 13.3 g/dL (ref 13.0–17.0)
Potassium: 4 mmol/L (ref 3.5–5.1)
SODIUM: 142 mmol/L (ref 135–145)
TCO2: 25 mmol/L (ref 0–100)

## 2015-06-25 SURGERY — ENDARTERECTOMY, CAROTID
Anesthesia: General | Site: Neck | Laterality: Left

## 2015-06-25 MED ORDER — METFORMIN HCL 500 MG PO TABS
1000.0000 mg | ORAL_TABLET | Freq: Two times a day (BID) | ORAL | Status: DC
  Administered 2015-06-26: 1000 mg via ORAL
  Filled 2015-06-25: qty 2

## 2015-06-25 MED ORDER — LISINOPRIL 40 MG PO TABS
40.0000 mg | ORAL_TABLET | Freq: Every day | ORAL | Status: DC
  Administered 2015-06-26: 40 mg via ORAL
  Filled 2015-06-25: qty 1

## 2015-06-25 MED ORDER — SODIUM CHLORIDE 0.9 % IV SOLN
500.0000 mL | Freq: Once | INTRAVENOUS | Status: AC | PRN
Start: 2015-06-25 — End: 2015-06-25
  Administered 2015-06-25 (×2): 500 mL via INTRAVENOUS

## 2015-06-25 MED ORDER — PHENYLEPHRINE 40 MCG/ML (10ML) SYRINGE FOR IV PUSH (FOR BLOOD PRESSURE SUPPORT)
PREFILLED_SYRINGE | INTRAVENOUS | Status: AC
  Filled 2015-06-25: qty 10

## 2015-06-25 MED ORDER — CHLORHEXIDINE GLUCONATE 4 % EX LIQD: 60.0000 mL | Freq: Once | CUTANEOUS | Status: DC

## 2015-06-25 MED ORDER — GABAPENTIN 300 MG PO CAPS
300.0000 mg | ORAL_CAPSULE | Freq: Two times a day (BID) | ORAL | Status: DC
  Administered 2015-06-25 – 2015-06-26 (×2): 300 mg via ORAL
  Filled 2015-06-25 (×2): qty 1

## 2015-06-25 MED ORDER — MORPHINE SULFATE (PF) 2 MG/ML IV SOLN: 2.0000 mg | INTRAVENOUS | Status: DC | PRN

## 2015-06-25 MED ORDER — GUAIFENESIN-DM 100-10 MG/5ML PO SYRP: 15.0000 mL | ORAL_SOLUTION | ORAL | Status: DC | PRN

## 2015-06-25 MED ORDER — BISACODYL 10 MG RE SUPP: 10.0000 mg | Freq: Every day | RECTAL | Status: DC | PRN

## 2015-06-25 MED ORDER — GLUCOSE-VITAMIN C 4-6 GM-MG PO CHEW
CHEWABLE_TABLET | ORAL | Status: AC
  Administered 2015-06-25: 4 g
  Filled 2015-06-25: qty 1

## 2015-06-25 MED ORDER — LACTATED RINGERS IV SOLN
INTRAVENOUS | Status: DC | PRN
  Administered 2015-06-25 (×3): via INTRAVENOUS

## 2015-06-25 MED ORDER — ACETAMINOPHEN 650 MG RE SUPP: 325.0000 mg | RECTAL | Status: DC | PRN

## 2015-06-25 MED ORDER — ONDANSETRON HCL 4 MG/2ML IJ SOLN: 4.0000 mg | Freq: Four times a day (QID) | INTRAMUSCULAR | Status: DC | PRN

## 2015-06-25 MED ORDER — CHLORHEXIDINE GLUCONATE CLOTH 2 % EX PADS: 6.0000 | MEDICATED_PAD | Freq: Once | CUTANEOUS | Status: DC

## 2015-06-25 MED ORDER — PIOGLITAZONE HCL 15 MG PO TABS
15.0000 mg | ORAL_TABLET | Freq: Every day | ORAL | Status: DC
  Administered 2015-06-26: 15 mg via ORAL
  Filled 2015-06-25 (×2): qty 1

## 2015-06-25 MED ORDER — MAGNESIUM HYDROXIDE 400 MG/5ML PO SUSP
30.0000 mL | Freq: Every day | ORAL | Status: DC | PRN
  Filled 2015-06-25: qty 30

## 2015-06-25 MED ORDER — 0.9 % SODIUM CHLORIDE (POUR BTL) OPTIME
TOPICAL | Status: DC | PRN
  Administered 2015-06-25: 2000 mL

## 2015-06-25 MED ORDER — LABETALOL HCL 5 MG/ML IV SOLN: 10.0000 mg | INTRAVENOUS | Status: DC | PRN

## 2015-06-25 MED ORDER — HYDROMORPHONE HCL 1 MG/ML IJ SOLN
0.2500 mg | INTRAMUSCULAR | Status: DC | PRN
  Administered 2015-06-25: 0.25 mg via INTRAVENOUS

## 2015-06-25 MED ORDER — CHLORPROMAZINE HCL 25 MG PO TABS
25.0000 mg | ORAL_TABLET | Freq: Four times a day (QID) | ORAL | Status: DC | PRN
  Filled 2015-06-25: qty 1

## 2015-06-25 MED ORDER — LISINOPRIL-HYDROCHLOROTHIAZIDE 20-12.5 MG PO TABS: 2.0000 | ORAL_TABLET | Freq: Every day | ORAL | Status: DC

## 2015-06-25 MED ORDER — ROCURONIUM BROMIDE 50 MG/5ML IV SOLN
INTRAVENOUS | Status: AC
  Filled 2015-06-25: qty 1

## 2015-06-25 MED ORDER — EPHEDRINE SULFATE 50 MG/ML IJ SOLN
INTRAMUSCULAR | Status: DC | PRN
  Administered 2015-06-25 (×2): 5 mg via INTRAVENOUS
  Administered 2015-06-25: 10 mg via INTRAVENOUS

## 2015-06-25 MED ORDER — HYDROMORPHONE HCL 1 MG/ML IJ SOLN
INTRAMUSCULAR | Status: AC
  Filled 2015-06-25: qty 1

## 2015-06-25 MED ORDER — FENTANYL CITRATE (PF) 250 MCG/5ML IJ SOLN
INTRAMUSCULAR | Status: AC
  Filled 2015-06-25: qty 5

## 2015-06-25 MED ORDER — HYDRALAZINE HCL 20 MG/ML IJ SOLN: 5.0000 mg | INTRAMUSCULAR | Status: DC | PRN

## 2015-06-25 MED ORDER — DEXTROSE 5 % IV SOLN
1.5000 g | Freq: Two times a day (BID) | INTRAVENOUS | Status: AC
  Administered 2015-06-25 – 2015-06-26 (×2): 1.5 g via INTRAVENOUS
  Filled 2015-06-25 (×2): qty 1.5

## 2015-06-25 MED ORDER — ONDANSETRON HCL 4 MG/2ML IJ SOLN
INTRAMUSCULAR | Status: DC | PRN
  Administered 2015-06-25: 4 mg via INTRAVENOUS

## 2015-06-25 MED ORDER — PANTOPRAZOLE SODIUM 40 MG PO TBEC
40.0000 mg | DELAYED_RELEASE_TABLET | Freq: Every day | ORAL | Status: DC
  Administered 2015-06-26: 40 mg via ORAL
  Filled 2015-06-25: qty 1

## 2015-06-25 MED ORDER — CEFUROXIME SODIUM 1.5 G IJ SOLR
1.5000 g | INTRAMUSCULAR | Status: AC
  Administered 2015-06-25: 1.5 g via INTRAVENOUS
  Filled 2015-06-25: qty 1.5

## 2015-06-25 MED ORDER — OXYCODONE HCL 5 MG PO TABS
ORAL_TABLET | ORAL | Status: AC
  Filled 2015-06-25: qty 1

## 2015-06-25 MED ORDER — DEXTROSE 5 % IV SOLN
20.0000 mg | INTRAVENOUS | Status: DC | PRN
  Administered 2015-06-25: 15 ug/min via INTRAVENOUS

## 2015-06-25 MED ORDER — HEPARIN SODIUM (PORCINE) 1000 UNIT/ML IJ SOLN
INTRAMUSCULAR | Status: AC
  Filled 2015-06-25: qty 1

## 2015-06-25 MED ORDER — SODIUM CHLORIDE 0.9 % IV SOLN: INTRAVENOUS | Status: DC

## 2015-06-25 MED ORDER — OXYCODONE HCL 5 MG/5ML PO SOLN: 5.0000 mg | Freq: Once | ORAL | Status: AC | PRN

## 2015-06-25 MED ORDER — PROPOFOL 10 MG/ML IV BOLUS
INTRAVENOUS | Status: DC | PRN
  Administered 2015-06-25: 160 mg via INTRAVENOUS

## 2015-06-25 MED ORDER — ROCURONIUM BROMIDE 100 MG/10ML IV SOLN
INTRAVENOUS | Status: DC | PRN
  Administered 2015-06-25: 50 mg via INTRAVENOUS

## 2015-06-25 MED ORDER — PHENOL 1.4 % MT LIQD: 1.0000 | OROMUCOSAL | Status: DC | PRN

## 2015-06-25 MED ORDER — SUCCINYLCHOLINE CHLORIDE 200 MG/10ML IV SOSY
PREFILLED_SYRINGE | INTRAVENOUS | Status: AC
  Filled 2015-06-25: qty 10

## 2015-06-25 MED ORDER — SODIUM CHLORIDE 0.9 % IV SOLN
INTRAVENOUS | Status: DC
  Administered 2015-06-25: 16:00:00 via INTRAVENOUS

## 2015-06-25 MED ORDER — HEPARIN SODIUM (PORCINE) 1000 UNIT/ML IJ SOLN
INTRAMUSCULAR | Status: DC | PRN
  Administered 2015-06-25: 9000 [IU] via INTRAVENOUS

## 2015-06-25 MED ORDER — SODIUM CHLORIDE 0.9 % IV SOLN
INTRAVENOUS | Status: DC | PRN
  Administered 2015-06-25: 500 mL

## 2015-06-25 MED ORDER — POTASSIUM CHLORIDE CRYS ER 20 MEQ PO TBCR: 20.0000 meq | EXTENDED_RELEASE_TABLET | Freq: Every day | ORAL | Status: DC | PRN

## 2015-06-25 MED ORDER — ONDANSETRON HCL 4 MG/2ML IJ SOLN: 4.0000 mg | Freq: Once | INTRAMUSCULAR | Status: DC | PRN

## 2015-06-25 MED ORDER — LIDOCAINE HCL (PF) 1 % IJ SOLN
INTRAMUSCULAR | Status: AC
  Filled 2015-06-25: qty 30

## 2015-06-25 MED ORDER — OXYCODONE HCL 5 MG PO TABS
5.0000 mg | ORAL_TABLET | Freq: Once | ORAL | Status: AC | PRN
  Administered 2015-06-25: 5 mg via ORAL

## 2015-06-25 MED ORDER — LIDOCAINE HCL (CARDIAC) 20 MG/ML IV SOLN
INTRAVENOUS | Status: DC | PRN
  Administered 2015-06-25: 40 mg via INTRAVENOUS
  Administered 2015-06-25: 60 mg via INTRAVENOUS

## 2015-06-25 MED ORDER — LIDOCAINE 2% (20 MG/ML) 5 ML SYRINGE
INTRAMUSCULAR | Status: AC
  Filled 2015-06-25: qty 5

## 2015-06-25 MED ORDER — GLYCOPYRROLATE 0.2 MG/ML IJ SOLN
INTRAMUSCULAR | Status: DC | PRN
  Administered 2015-06-25: 0.2 mg via INTRAVENOUS

## 2015-06-25 MED ORDER — ROSUVASTATIN CALCIUM 20 MG PO TABS
20.0000 mg | ORAL_TABLET | Freq: Every day | ORAL | Status: DC
  Filled 2015-06-25: qty 1

## 2015-06-25 MED ORDER — MUPIROCIN 2 % EX OINT: 1.0000 "application " | TOPICAL_OINTMENT | Freq: Once | CUTANEOUS | Status: DC

## 2015-06-25 MED ORDER — HYDROCHLOROTHIAZIDE 25 MG PO TABS
25.0000 mg | ORAL_TABLET | Freq: Every day | ORAL | Status: DC
  Administered 2015-06-26: 25 mg via ORAL
  Filled 2015-06-25: qty 1

## 2015-06-25 MED ORDER — PROTAMINE SULFATE 10 MG/ML IV SOLN
INTRAVENOUS | Status: DC | PRN
  Administered 2015-06-25 (×3): 10 mg via INTRAVENOUS

## 2015-06-25 MED ORDER — PHENYLEPHRINE HCL 10 MG/ML IJ SOLN
INTRAMUSCULAR | Status: DC | PRN
  Administered 2015-06-25 (×2): 160 ug via INTRAVENOUS
  Administered 2015-06-25: 80 ug via INTRAVENOUS
  Administered 2015-06-25: 160 ug via INTRAVENOUS

## 2015-06-25 MED ORDER — INSULIN ASPART 100 UNIT/ML ~~LOC~~ SOLN: 0.0000 [IU] | Freq: Three times a day (TID) | SUBCUTANEOUS | Status: DC

## 2015-06-25 MED ORDER — ALUM & MAG HYDROXIDE-SIMETH 200-200-20 MG/5ML PO SUSP: 15.0000 mL | ORAL | Status: DC | PRN

## 2015-06-25 MED ORDER — ASPIRIN EC 81 MG PO TBEC
81.0000 mg | DELAYED_RELEASE_TABLET | Freq: Every day | ORAL | Status: DC
  Administered 2015-06-26: 81 mg via ORAL
  Filled 2015-06-25: qty 1

## 2015-06-25 MED ORDER — TAMSULOSIN HCL 0.4 MG PO CAPS
0.4000 mg | ORAL_CAPSULE | Freq: Every day | ORAL | Status: DC
  Filled 2015-06-25: qty 1

## 2015-06-25 MED ORDER — VERAPAMIL HCL ER 180 MG PO TBCR: 180.0000 mg | EXTENDED_RELEASE_TABLET | Freq: Two times a day (BID) | ORAL | Status: DC

## 2015-06-25 MED ORDER — OXYCODONE-ACETAMINOPHEN 5-325 MG PO TABS
1.0000 | ORAL_TABLET | ORAL | Status: DC | PRN
  Administered 2015-06-25: 2 via ORAL
  Filled 2015-06-25: qty 2

## 2015-06-25 MED ORDER — DEXTRAN 40 IN SALINE 10-0.9 % IV SOLN
INTRAVENOUS | Status: AC
  Filled 2015-06-25: qty 500

## 2015-06-25 MED ORDER — EPHEDRINE 5 MG/ML INJ
INTRAVENOUS | Status: AC
  Filled 2015-06-25: qty 10

## 2015-06-25 MED ORDER — DEXTRAN 40 IN SALINE 10-0.9 % IV SOLN
INTRAVENOUS | Status: DC | PRN
  Administered 2015-06-25: 500 mL

## 2015-06-25 MED ORDER — NEOSTIGMINE METHYLSULFATE 10 MG/10ML IV SOLN
INTRAVENOUS | Status: DC | PRN
Start: 2015-06-25 — End: 2015-06-25
  Administered 2015-06-25: 3 mg via INTRAVENOUS

## 2015-06-25 MED ORDER — DOCUSATE SODIUM 100 MG PO CAPS
100.0000 mg | ORAL_CAPSULE | Freq: Every day | ORAL | Status: DC
  Administered 2015-06-26: 100 mg via ORAL
  Filled 2015-06-25: qty 1

## 2015-06-25 MED ORDER — ACETAMINOPHEN 325 MG PO TABS: 325.0000 mg | ORAL_TABLET | ORAL | Status: DC | PRN

## 2015-06-25 MED ORDER — SODIUM CHLORIDE 0.9 % IV SOLN
0.0125 ug/kg/min | INTRAVENOUS | Status: AC
  Administered 2015-06-25: 84 ug via INTRAVENOUS
  Filled 2015-06-25: qty 2000

## 2015-06-25 MED ORDER — METOPROLOL TARTRATE 5 MG/5ML IV SOLN: 2.0000 mg | INTRAVENOUS | Status: DC | PRN

## 2015-06-25 MED ORDER — PROPOFOL 10 MG/ML IV BOLUS
INTRAVENOUS | Status: AC
  Filled 2015-06-25: qty 20

## 2015-06-25 SURGICAL SUPPLY — 53 items
ADPR TBG 2 MALE LL ART (MISCELLANEOUS) ×1
AGENT HMST SPONGE THK3/8 (HEMOSTASIS)
BAG DECANTER FOR FLEXI CONT (MISCELLANEOUS) ×3 IMPLANT
CANISTER SUCTION 2500CC (MISCELLANEOUS) ×3 IMPLANT
CATH ROBINSON RED A/P 18FR (CATHETERS) ×3 IMPLANT
CATH SUCT 10FR WHISTLE TIP (CATHETERS) ×2 IMPLANT
CLIP TI MEDIUM 6 (CLIP) ×3 IMPLANT
CLIP TI WIDE RED SMALL 6 (CLIP) ×3 IMPLANT
COVER PROBE W GEL 5X96 (DRAPES) IMPLANT
CRADLE DONUT ADULT HEAD (MISCELLANEOUS) ×3 IMPLANT
ELECT REM PT RETURN 9FT ADLT (ELECTROSURGICAL) ×3
ELECTRODE REM PT RTRN 9FT ADLT (ELECTROSURGICAL) ×1 IMPLANT
GAUZE SPONGE 4X4 12PLY STRL (GAUZE/BANDAGES/DRESSINGS) ×2 IMPLANT
GLOVE BIO SURGEON STRL SZ 6.5 (GLOVE) ×2 IMPLANT
GLOVE BIO SURGEON STRL SZ7 (GLOVE) ×5 IMPLANT
GLOVE BIO SURGEONS STRL SZ 6.5 (GLOVE) ×2
GLOVE BIOGEL PI IND STRL 6.5 (GLOVE) IMPLANT
GLOVE BIOGEL PI IND STRL 7.5 (GLOVE) ×1 IMPLANT
GLOVE BIOGEL PI INDICATOR 6.5 (GLOVE) ×2
GLOVE BIOGEL PI INDICATOR 7.5 (GLOVE) ×4
GLOVE SURG SS PI 7.0 STRL IVOR (GLOVE) ×4 IMPLANT
GOWN STRL REUS W/ TWL LRG LVL3 (GOWN DISPOSABLE) ×3 IMPLANT
GOWN STRL REUS W/TWL LRG LVL3 (GOWN DISPOSABLE) ×6
GOWN STRL REUS W/TWL XL LVL3 (GOWN DISPOSABLE) ×4 IMPLANT
HEMOSTAT SPONGE AVITENE ULTRA (HEMOSTASIS) IMPLANT
IV ADAPTER SYR DOUBLE MALE LL (MISCELLANEOUS) ×2 IMPLANT
KIT BASIN OR (CUSTOM PROCEDURE TRAY) ×3 IMPLANT
KIT ROOM TURNOVER OR (KITS) ×3 IMPLANT
KIT SHUNT ARGYLE CAROTID ART 6 (VASCULAR PRODUCTS) ×2 IMPLANT
LIQUID BAND (GAUZE/BANDAGES/DRESSINGS) ×3 IMPLANT
NS IRRIG 1000ML POUR BTL (IV SOLUTION) ×9 IMPLANT
PACK CAROTID (CUSTOM PROCEDURE TRAY) ×3 IMPLANT
PAD ARMBOARD 7.5X6 YLW CONV (MISCELLANEOUS) ×6 IMPLANT
PATCH VASC XENOSURE 1CMX6CM (Vascular Products) ×3 IMPLANT
PATCH VASC XENOSURE 1X6 (Vascular Products) IMPLANT
SET COLLECT BLD 21X3/4 12 PB (MISCELLANEOUS) ×4 IMPLANT
SHUNT CAROTID BYPASS 10 (VASCULAR PRODUCTS) IMPLANT
SHUNT CAROTID BYPASS 12FRX15.5 (VASCULAR PRODUCTS) IMPLANT
SPONGE INTESTINAL PEANUT (DISPOSABLE) ×1 IMPLANT
STOPCOCK 4 WAY LG BORE MALE ST (IV SETS) ×2 IMPLANT
SUT ETHILON 3 0 PS 1 (SUTURE) IMPLANT
SUT MNCRL AB 4-0 PS2 18 (SUTURE) ×3 IMPLANT
SUT PROLENE 6 0 BV (SUTURE) ×7 IMPLANT
SUT PROLENE 7 0 BV 1 (SUTURE) ×2 IMPLANT
SUT SILK 3 0 TIES 17X18 (SUTURE)
SUT SILK 3-0 18XBRD TIE BLK (SUTURE) IMPLANT
SUT VIC AB 3-0 SH 27 (SUTURE) ×3
SUT VIC AB 3-0 SH 27X BRD (SUTURE) ×1 IMPLANT
SYR TB 1ML LUER SLIP (SYRINGE) ×2 IMPLANT
SYSTEM CHEST DRAIN TLS 7FR (DRAIN) IMPLANT
TUBING ART PRESS 48 MALE/FEM (TUBING) ×2 IMPLANT
TUBING EXTENTION W/L.L. (IV SETS) ×2 IMPLANT
WATER STERILE IRR 1000ML POUR (IV SOLUTION) ×3 IMPLANT

## 2015-06-25 NOTE — Anesthesia Preprocedure Evaluation (Signed)
Anesthesia Evaluation  Patient identified by MRN, date of birth, ID band Patient awake    Reviewed: Allergy & Precautions, NPO status , Patient's Chart, lab work & pertinent test results  Airway Mallampati: II  TM Distance: >3 FB Neck ROM: Full    Dental  (+) Teeth Intact, Dental Advisory Given   Pulmonary former smoker,    breath sounds clear to auscultation       Cardiovascular hypertension,  Rhythm:Regular Rate:Normal     Neuro/Psych    GI/Hepatic   Endo/Other  diabetes  Renal/GU      Musculoskeletal   Abdominal   Peds  Hematology   Anesthesia Other Findings   Reproductive/Obstetrics                             Anesthesia Physical Anesthesia Plan  ASA: III  Anesthesia Plan: General   Post-op Pain Management:    Induction: Intravenous  Airway Management Planned: Oral ETT  Additional Equipment:   Intra-op Plan:   Post-operative Plan: Extubation in OR  Informed Consent: I have reviewed the patients History and Physical, chart, labs and discussed the procedure including the risks, benefits and alternatives for the proposed anesthesia with the patient or authorized representative who has indicated his/her understanding and acceptance.   Dental advisory given  Plan Discussed with: CRNA and Anesthesiologist  Anesthesia Plan Comments:         Anesthesia Quick Evaluation  

## 2015-06-25 NOTE — Anesthesia Postprocedure Evaluation (Signed)
Anesthesia Post Note  Patient: Jeremy Patton  Procedure(s) Performed: Procedure(s) (LRB): LEFT CAROTID ENDARTERECTOMY (Left) PATCH ANGIOPLASTY USING XENOSURE BIOLOGIC PATCH 1cm X 6cm  (Left)  Patient location during evaluation: PACU Anesthesia Type: General Level of consciousness: awake, awake and alert and oriented Pain management: pain level controlled Vital Signs Assessment: post-procedure vital signs reviewed and stable Respiratory status: spontaneous breathing, nonlabored ventilation and respiratory function stable Cardiovascular status: blood pressure returned to baseline Anesthetic complications: no    Last Vitals:  Filed Vitals:   06/25/15 1145 06/25/15 1200  BP: 101/40 102/46  Pulse: 76 88  Temp:    Resp: 19 18    Last Pain:  Filed Vitals:   06/25/15 1216  PainSc: 0-No pain                 Briane Birden COKER

## 2015-06-25 NOTE — H&P (Signed)
Brief History and Physical  History of Present Illness  Jeremy Patton is a 75 y.o. 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  The patient presents today for L CEA.    Past Medical History  Diagnosis Date  . Diabetes mellitus without complication ([place]) [id number]  . Hyperlipidemia 11/29/2011  . Low back pain 11/29/2011  . Lumbar radiculopathy 11/29/2011    Left  . Hypertension 11/29/2011  . Pain of left lower leg   . Carotid artery occlusion     Bruit, Right  . GERD (gastroesophageal reflux disease)   . Wears glasses     Past Surgical History  Procedure Laterality Date  . Colonoscopy  08/22/2005 and  07/31/2009    minimal internal hemorrhoids, left-sided diverticulosis  . Tonsillectomy    . Circumcision      Age 9  . Colonoscopy N/A 11/25/2014    Procedure: COLONOSCOPY;  Surgeon: Daneil Dolin, MD;  [facility];  Service: Endoscopy;  Laterality: N/A;  1030  . Esophagogastroduodenoscopy N/A 11/25/2014    Procedure: ESOPHAGOGASTRODUODENOSCOPY (EGD);  Surgeon: Daneil Dolin, MD;  [facility];  Service: Endoscopy;  Laterality: N/A;  . Esophageal dilation N/A 11/25/2014    Procedure: ESOPHAGEAL DILATION;  Surgeon: Daneil Dolin, MD;  [facility];  Service: Endoscopy;  Laterality: N/A;  . Cataract extraction w/ intraocular lens  implant, bilateral    . Multiple tooth extractions    . Endarterectomy Right 05/06/2015    Procedure: Right Carotid ENDARTERECTOMY ;  Surgeon: Conrad Hancock, MD;  Location: [facility];  Service: Vascular;  Laterality: Right;  . Patch angioplasty Right 05/06/2015    Procedure: PATCH ANGIOPLASTY;  Surgeon: Conrad [place], MD;  Location: [facility];  Service: Vascular;  Laterality: Right;  . Eye surgery      Social History   Social History  . Marital Status: Married    Spouse Name: N/A  . Number of Children: N/A  . Years of Education: N/A   Occupational History  . Not on file.   Social History Main  Topics  . Smoking status: Former Smoker -- 2.00 packs/day for 30 years    Types: Cigarettes    Quit date: 12/17/1991  . Smokeless tobacco: Never Used  . Alcohol Use: No  . Drug Use: No  . Sexual Activity: Not on file   Other Topics Concern  . Not on file   Social History Narrative    Family History  Problem Relation Age of Onset  . Colon cancer Brother     Age 46  . Stroke Mother   . Heart disease Mother     before age 33    No current facility-administered medications on file prior to encounter.   Current Outpatient Prescriptions on File Prior to Encounter  Medication Sig Dispense Refill  . acetaminophen (TYLENOL) 500 MG tablet Take 1,000 mg by mouth every 6 (six) hours as needed for moderate pain.    Marland Kitchen aspirin 81 MG tablet Take 81 mg by mouth daily.    . chlorproMAZINE (THORAZINE) 25 MG tablet Take 25 mg by mouth 4 (four) times daily as needed for nausea. Reported on 05/22/2015    . gabapentin (NEURONTIN) 300 MG capsule Take 300 mg by mouth 2 (two) times daily.     . Insulin Aspart (NOVOLOG FLEXPEN [place]) Inject 3 Units into the skin daily as needed (High Blood Sugar).     Marland Kitchen lisinopril-hydrochlorothiazide (PRINZIDE,ZESTORETIC)  20-12.5 MG per tablet Take 2 tablets by mouth daily.     . metFORMIN (GLUCOPHAGE) 1000 MG tablet Take 1,000 mg by mouth 2 (two) times daily with a meal.     . pantoprazole (PROTONIX) 40 MG tablet Take 40 mg by mouth daily.     . pioglitazone (ACTOS) 15 MG tablet Take 15 mg by mouth daily.     . rosuvastatin (CRESTOR) 20 MG tablet Take 1 tablet (20 mg total) by mouth daily. 30 tablet 6  . Tamsulosin HCl (FLOMAX) 0.4 MG CAPS Take 0.4 mg by mouth daily.     . verapamil (CALAN-SR) 180 MG CR tablet Take 180 mg by mouth 2 (two) times daily.      No Known Allergies  Review of Systems: As listed above, otherwise negative.  Physical Examination  Filed Vitals:   06/25/15 0701 06/25/15 0726  BP: 168/68   Pulse: 71   Temp: 97.6 F (36.4 C)   TempSrc:  Oral   Resp: 18   Height:  5\' 11"  (1.803 m)  Weight: 186 lb (84.369 kg) 186 lb (84.369 kg)  SpO2: 97%     General: A&O x 3, WDWN  Pulmonary: Sym exp, good air movt, CTAB, no rales, rhonchi, & wheezing  Cardiac: RRR, Nl S1, S2, no Murmurs, rubs or gallops  Gastrointestinal: soft, NTND, -G/R, - HSM, - masses, - CVAT B TTP: (RUQ / RLQ / LUQ / LLQ / Epigastric), +G / +R  Musculoskeletal: M/S 5/5 throughout , Extremities without ischemic changes   Neuro: CN 2-12 intact, MS 5/5 bilaterally, sensation grossly intact  Laboratory: CBC:    Component Value Date/Time   WBC 5.7 06/10/2015 1011   WBC 5.6 04/30/2015 0813   RBC 4.15* 06/10/2015 1011   RBC 4.79 04/30/2015 0813   HGB 13.3 06/25/2015 0654   HCT 39.0 06/25/2015 0654   HCT 43.8 04/30/2015 0813   PLT 186 06/10/2015 1011   PLT 216 04/30/2015 0813   MCV 91.1 06/10/2015 1011   MCV 91 04/30/2015 0813   MCH 29.6 06/10/2015 1011   MCH 30.3 04/30/2015 0813   MCHC 32.5 06/10/2015 1011   MCHC 33.1 04/30/2015 0813   RDW 13.3 06/10/2015 1011   RDW 13.7 04/30/2015 0813    BMP:    Component Value Date/Time   NA 142 06/25/2015 0654   NA 139 04/30/2015 0813   K 4.0 06/25/2015 0654   CL 103 06/25/2015 0654   CO2 25 06/10/2015 1011   GLUCOSE 123* 06/25/2015 0654   GLUCOSE 219* 04/30/2015 0813   BUN 23* 06/25/2015 0654   BUN 26 04/30/2015 0813   CREATININE 1.10 06/25/2015 0654   CREATININE 1.31* 04/25/2015 1245   CALCIUM 9.3 06/10/2015 1011   GFRNONAA 55* 06/10/2015 1011   GFRAA >60 06/10/2015 1011    Coagulation: Lab Results  Component Value Date   INR 1.06 06/10/2015   INR 1.02 05/05/2015   No results found for: PTT  Lipids:    Component Value Date/Time   CHOL 163 04/30/2015 0813   TRIG 55 04/30/2015 0813   HDL 63 04/30/2015 0813   LDLCALC 89 04/30/2015 0813      Medical Decision Making  Jeremy Patton is a 75 y.o. 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   The patient is scheduled for: L  CEA I discussed with the patient the risks, benefits, and alternatives to carotid endarterectomy.   The patient is aware that the risks of carotid endarterectomy include but are  not limited to: bleeding, infection, stroke, myocardial infarction, death, cranial nerve injuries both temporary and permanent, neck hematoma, possible airway compromise, labile blood pressure post-operatively, cerebral hyperperfusion syndrome, and possible need for additional interventions in the future.   The patient is aware of the risks and agrees to proceed forward with the procedure.   Adele Barthel, MD [facility] Office: [phone] Pager: [phone]  06/25/2015, 8:09 AM

## 2015-06-25 NOTE — Progress Notes (Signed)
  Day of Surgery Note    Subjective:  Having some pain in his neck  Filed Vitals:   06/25/15 1145 06/25/15 1200  BP:  102/46  Pulse: 76 88  Temp:    Resp: 19 18    Incisions:   Clean and dry Extremities:  Moving all extremities equally Lungs:  Non labored Neuro:  In tact; tongue is midline    Assessment/Plan:  This is a 75 y.o. male who is s/p  1. left carotid endarterectomy with bovine patch angioplasty 2. left intraoperative carotid ultrasound  -pt doing well in pacu and neurologically in tact. -BP a little soft with systolic around 90.  May need a fluid bolus, but tolerating right now -to [place] when bed available.   Leontine Locket, PA-C 06/25/2015 12:14 PM

## 2015-06-25 NOTE — Progress Notes (Signed)
Inpatient Diabetes Program Recommendations  AACE/ADA: New Consensus Statement on Inpatient Glycemic Control (2015)  Target Ranges:  Prepandial:   less than 140 mg/dL      Peak postprandial:   less than 180 mg/dL (1-2 hours)      Critically ill patients:  140 - 180 mg/dL   Review of Glycemic Control  Diabetes history: DM2 Outpatient Diabetes medications: Lantus 30 units QAM, Novolog 3 units prn, Actos 15 units QD, metformin 1000 mg bid Current orders for Inpatient glycemic control: Novolog moderate tidwc  Results for HOA, FRAIM (MRN [id number]) as of 06/25/2015 15:43  Ref. Range 06/25/2015 07:09 06/25/2015 11:27  Glucose-Capillary Latest Ref Range: 65-99 mg/dL 113 (H) 93  Results for WALLY, SHEAN (MRN [id number]) as of 06/25/2015 15:43  Ref. Range 05/05/2015 13:56  Hemoglobin A1C Latest Ref Range: 4.8-5.6 % 9.1 (H)   Good glycemic control at present.  Inpatient Diabetes Program Recommendations:    Lantus 25 units QAM Novolog moderate tidwc and hs  Add meal coverage - Novolog 4 units tidwc if pt eats > 50% meal. Add CHO mod med diet  Will follow. Thank you. Lorenda Peck, RD, LDN, CDE Inpatient Diabetes Coordinator [phone]

## 2015-06-25 NOTE — Op Note (Signed)
OPERATIVE NOTE  PROCEDURE:   1.  left carotid endarterectomy with bovine patch angioplasty 2.  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  Continuous Doppler audible flow signatures are appropriate for each carotid artery. 2.  No evidence of intimal flap visualized on transverse or longitudinal ultrasonography. 3.  Calcified near occluded internal carotid plaque.  SPECIMEN(S):  Carotid plaque (sent to Pathology)  INDICATIONS:   Jeremy Patton is a 75 y.o. male who presents with left asymptomatic carotid stenosis >90%.  I discussed with the patient the risks, benefits, and alternatives to carotid endarterectomy.  I discussed the procedural details of carotid endarterectomy with the patient.  The patient is aware that the risks of carotid endarterectomy include but are not limited to: bleeding, infection, stroke, myocardial infarction, death, cranial nerve injuries both temporary and permanent, neck hematoma, possible airway compromise, labile blood pressure post-operatively, cerebral hyperperfusion syndrome, and possible need for additional interventions in the future. The patient is aware of the risks and agrees to proceed forward with the procedure.  DESCRIPTION: After full informed written consent was obtained from the patient, the patient was brought back to the operating room and placed supine upon the operating table.  Prior to induction, the patient received IV antibiotics.  After obtaining adequate anesthesia, the patient was placed into semi-Fowler position with a shoulder roll in place and the patient's neck slightly hyperextended and rotated away from the surgical site.  The patient was prepped in the standard fashion for a left carotid  endarterectomy.  I made an incision anterior to the sternocleidomastoid muscle and dissected down through the subcutaneous tissue.  The platysmas was opened with electrocautery.  Then I dissected down to the internal jugular vein.  This was dissected posteriorly until I obtained visualization of the common carotid artery.  This was dissected out and then an umbilical tape was placed around the common carotid artery and I loosely applied a Rumel tourniquet.  I then dissected in a periadventitial fashion along the common carotid artery up to the bifurcation.  I then identified the external carotid artery and the superior thyroid artery.  A 2-0 silk tie was looped around the superior thyroid artery, and I also dissected out the external carotid artery and placed a vessel loop around it.  In continuing the dissection to the internal carotid artery, I identified the facial vein.  This was ligated and then transected, giving me improved exposure of the internal carotid artery.  In the process of this dissection, the hypoglossal nerve was identified much more superior than expected.  This was verified by following the ansa cervicalis up to the hypoglossal nerve.  I then dissected out the internal carotid artery until I identified an area of soft tissue in the internal carotid artery.  I dissected slightly distal to this area, and placed an umbilical tape around the artery and loosely applied a Rumel tourniquet.  I had some concerns to relatively high extent of internal carotid artery disease, so I elected to obtain a back pressure in case shunt placement was not possible.  The IV tubing to obtain a back pressure was obtained.  I passed this up to the anesthesia team.  The circuit was flushed to remove all air.  At this point, I gave the patient a  therapeutic bolus of Heparin intravenously, 9000 units.  After waiting 3 minutes, then I clamped the common carotid artery and external carotid artery.  I cannulated the left  common carotid artery with the butterfly needle connected to the arterial line circuit.  I only obtained a back pressure of 30 mm Hg, so I felt that shunt placement was needed.  I removed the needle and passed the IV tubing off the field.    I then made an arteriotomy in the common carotid artery with a 11 blade, and extended the arteriotomy with a Potts scissor down into the common carotid artery, then I carried the arteriotomy through the bifurcation into the internal carotid artery until I reached an area that was not diseased.  At this point, I took the 10 shunt that previously been prepared and I inserted it into the internal carotid artery.  I tried to apply the Rumel tourniquet was then applied to this end of the shunt but it kept sliding down.  Due to the high extent, I elected to clamp the shunt with a Jarrvik clamp.  I unclamped the shunt to verify retrograde blood flow in the internal carotid artery.  I then placed the other end of the shunt into the common carotid artery after unclamping the artery.  The Rumel was tightened down around the shunt.  At this point, I verified blood flow in the shunt with a continuous doppler.  At this point, I started the endarterectomy in the common carotid artery with a Technical brewer and carried this dissection down into the common carotid artery circumferentially.  Then I transected the plaque at a segment where it was adherent.  I then carried this dissection up into the external carotid artery.  The plaque was extracted by unclamping the external carotid artery and everting the artery.  The dissection was then carried into the internal carotid artery, extracting the remaining portion of the carotid plaque.  I passed the plaque off the field as a specimen.  I then spent the next 30 minutes removing intimal flaps and loose debris.  Eventually I reached the point where the residual plaque was densely adherent and any further dissection would compromise the  integrity of the wall.  After verifying that there was no more loose intimal flaps or debris, I re-interrogated the entirety of this carotid artery.  At this point, I was satisfied that the minimal remaining disease was densely adherent to the wall and wall integrity was intact.  At this point, I then fashioned a bovine pericardial patch for the geometry of this artery and sewed it in place with two running stitch of 6-0 Prolene, one from each end.  Prior to completing this patch angioplasty, I removed the shunt first from the internal carotid artery, from which there was excellent backbleeding, and clamped it.  Then I removed the shunt from the common carotid artery, from which there was excellent antegrade bleeding, and then clamped it.  At this point, I allowed the external carotid artery to backbleed, which was excellent.  Then I instilled heparinized saline in this patched artery and then completed the patch angioplasty in the usual fashion.  First, I released the clamp on the external carotid artery, then I released it on the common carotid artery.  After waiting a few seconds, I then released it on the internal carotid artery.  I then interrogated this patient's arteries with the continuous Doppler.  The audible waveforms in each artery were consistent with the expected  characteristics for each artery.  The Sonosite probe was then sterilely draped and used to interrogate the carotid artery in both longitudinal and transverse views.     At this point, I washed out the wound, and gave the patient 30 mg of protamine to reverse his anticoagulation.   This resulted in hypotension.  This required anesthesia reverse the hypotension with some vasopressors.  By the end of the case, this hypotension was mostly resolving, requiring a minimal amount of Neo.  After waiting a few minutes, I washed out the wound.  There was no more active bleeding in the surgical site.   I then reapproximated the platysma muscle with a  running stitch of 3-0 Vicryl.  The skin was then reapproximated with a running subcuticular 4-0 Monocryl stitch.  The skin was then cleaned, dried and Dermabond was used to reinforce the skin closure.  The patient woke without any problems, neurologically intact.      COMPLICATIONS: none  CONDITION: stable  Adele Barthel, MD [facility] [place] Office: [phone] Pager: [phone]  06/25/2015, 10:50 AM

## 2015-06-25 NOTE — Transfer of Care (Signed)
Immediate Anesthesia Transfer of Care Note  Patient: Jeremy Patton  Procedure(s) Performed: Procedure(s): LEFT CAROTID ENDARTERECTOMY (Left) PATCH ANGIOPLASTY USING XENOSURE BIOLOGIC PATCH 1cm X 6cm  (Left)  Patient Location: PACU  Anesthesia Type:General  Level of Consciousness: awake, alert , oriented and patient cooperative  Airway & Oxygen Therapy: Patient Spontanous Breathing and Patient connected to nasal cannula oxygen  Post-op Assessment: Report given to RN, Post -op Vital signs reviewed and stable and Patient moving all extremities  Post vital signs: Reviewed and stable  Last Vitals:  Filed Vitals:   06/25/15 1112 06/25/15 1115  BP: 133/55   Pulse: 67 61  Temp:    Resp: 16 17    Last Pain: There were no vitals filed for this visit.    Patients Stated Pain Goal: 8 ([id number])  Complications: No apparent anesthesia complications

## 2015-06-25 NOTE — Anesthesia Procedure Notes (Signed)
Procedure Name: Intubation Date/Time: 06/25/2015 8:39 AM Performed by: Myna Bright Pre-anesthesia Checklist: Patient identified, Emergency Drugs available, Patient being monitored and Suction available Patient Re-evaluated:Patient Re-evaluated prior to inductionOxygen Delivery Method: Circle system utilized Preoxygenation: Pre-oxygenation with 100% oxygen Intubation Type: IV induction Ventilation: Mask ventilation without difficulty and Oral airway inserted - appropriate to patient size Laryngoscope Size: Mac and 4 Grade View: Grade III Tube type: Oral Tube size: 7.5 mm Number of attempts: 1 Airway Equipment and Method: Stylet and LTA kit utilized Placement Confirmation: ETT inserted through vocal cords under direct vision,  positive ETCO2 and breath sounds checked- equal and bilateral Secured at: 22 cm Tube secured with: Tape Dental Injury: Teeth and Oropharynx as per pre-operative assessment

## 2015-06-26 ENCOUNTER — Encounter (HOSPITAL_COMMUNITY): Payer: Self-pay | Admitting: Vascular Surgery

## 2015-06-26 ENCOUNTER — Telehealth: Payer: Self-pay | Admitting: Vascular Surgery

## 2015-06-26 LAB — CBC
HCT: 36.3 % — ABNORMAL LOW (ref 39.0–52.0)
Hemoglobin: 11.7 g/dL — ABNORMAL LOW (ref 13.0–17.0)
MCH: 30 pg (ref 26.0–34.0)
MCHC: 32.2 g/dL (ref 30.0–36.0)
MCV: 93.1 fL (ref 78.0–100.0)
PLATELETS: 183 10*3/uL (ref 150–400)
RBC: 3.9 MIL/uL — AB (ref 4.22–5.81)
RDW: 13.9 % (ref 11.5–15.5)
WBC: 8.1 10*3/uL (ref 4.0–10.5)

## 2015-06-26 LAB — BASIC METABOLIC PANEL
Anion gap: 8 (ref 5–15)
BUN: 12 mg/dL (ref 6–20)
CALCIUM: 8.6 mg/dL — AB (ref 8.9–10.3)
CO2: 25 mmol/L (ref 22–32)
CREATININE: 1.05 mg/dL (ref 0.61–1.24)
Chloride: 108 mmol/L (ref 101–111)
GFR calc Af Amer: >60 mL/min (ref >60)
GFR calc non Af Amer: >60 mL/min (ref >60)
Glucose, Bld: 123 mg/dL — ABNORMAL HIGH (ref 65–99)
POTASSIUM: 4.5 mmol/L (ref 3.5–5.1)
SODIUM: 141 mmol/L (ref 135–145)

## 2015-06-26 LAB — GLUCOSE, CAPILLARY: GLUCOSE-CAPILLARY: 72 mg/dL (ref 65–99)

## 2015-06-26 NOTE — Discharge Summary (Signed)
Discharge Summary     Jeremy Patton 05-27-1940 75 y.o. male  [id number]  Admission Date: 06/25/2015  Discharge Date: 06/26/15  Physician: Conrad Tecumseh, MD  Admission Diagnosis: Left carotid artery stenosis I65.22   HPI:   This is a 75 y.o. 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 The patient presents today for L CEA.   Hospital Course:  The patient was admitted to the hospital and taken to the operating room on 06/25/2015 and underwent left carotid endarterectomy.  The pt tolerated the procedure well and was transported to the PACU in good condition.  His BP was soft, but this did recover and he did not require any support.    By POD 1, the pt neuro status in tact.    The remainder of the hospital course consisted of increasing mobilization and increasing intake of solids without difficulty.    Recent Labs  06/25/15 0654 06/26/15 0416  NA 142 141  K 4.0 4.5  CL 103 108  CO2  --  25  GLUCOSE 123* 123*  BUN 23* 12  CALCIUM  --  8.6*    Recent Labs  06/25/15 0654 06/26/15 0416  WBC  --  8.1  HGB 13.3 11.7*  HCT 39.0 36.3*  PLT  --  183   No results for input(s): INR in the last 72 hours.   Discharge Instructions    CAROTID [place]: Call MD for difficulty swallowing or speaking; weakness in arms or legs that is a new symtom; severe headache.  If you have increased swelling in the neck and/or  are having difficulty breathing, CALL [phone]    Complete by:  As directed      Call MD for:  redness, tenderness, or signs of infection (pain, swelling, bleeding, redness, odor or green/yellow discharge around incision site)    Complete by:  As directed      Call MD for:  severe or increased pain, loss or decreased feeling  in affected limb(s)    Complete by:  As directed      Call MD for:  temperature >100.5    Complete by:  As directed      Discharge wound care:    Complete by:  As directed   Shower daily with soap and water starting 06/27/15     Driving Restrictions    Complete by:  As directed   No driving for 2 weeks     Lifting restrictions    Complete by:  As directed   No lifting for 2 weeks     Resume previous diet    Complete by:  As directed            Discharge Diagnosis:  Left carotid artery stenosis I65.22  Secondary Diagnosis: Patient Active Problem List   Diagnosis Date Noted  . Left carotid artery stenosis 06/25/2015  . Carotid stenosis 05/06/2015  . Preoperative clearance 05/05/2015  . Carotid artery disease ([place]) 05/01/2015  . Dizziness 05/01/2015  . Essential hypertension 05/01/2015  . Hyperlipidemia LDL goal <70 05/01/2015  . Type 2 diabetes mellitus with circulatory disorder, with long-term current use of insulin ([place]) 05/01/2015  . History of colonic polyps   . Diverticulosis of colon without hemorrhage   . Reflux esophagitis   . Mucosal abnormality of stomach   . GERD (gastroesophageal reflux disease) 11/01/2014  . Esophageal dysphagia 11/01/2014  . FH: colon cancer in first degree relative <44 years old 11/01/2014  . Carotid stenosis, bilateral  12/17/2011   Past Medical History  Diagnosis Date  . Diabetes mellitus without complication ([place]) [id number]  . Hyperlipidemia 11/29/2011  . Low back pain 11/29/2011  . Lumbar radiculopathy 11/29/2011    Left  . Hypertension 11/29/2011  . Pain of left lower leg   . Carotid artery occlusion     Bruit, Right  . GERD (gastroesophageal reflux disease)   . Wears glasses       Medication List    TAKE these medications        acetaminophen 500 MG tablet  Commonly known as:  TYLENOL  Take 1,000 mg by mouth every 6 (six) hours as needed for moderate pain.     aspirin 81 MG tablet  Take 81 mg by mouth daily.     chlorproMAZINE 25 MG tablet  Commonly known as:  THORAZINE  Take 25 mg by mouth 4 (four) times daily as needed for nausea. Reported on 05/22/2015     gabapentin 300 MG capsule  Commonly known as:  NEURONTIN  Take 300 mg by mouth 2  (two) times daily.     LANTUS SOLOSTAR 100 UNIT/ML Solostar Pen  Generic drug:  Insulin Glargine  Inject 30 Units into the skin every morning.     lisinopril-hydrochlorothiazide 20-12.5 MG tablet  Commonly known as:  PRINZIDE,ZESTORETIC  Take 2 tablets by mouth daily.     metFORMIN 1000 MG tablet  Commonly known as:  GLUCOPHAGE  Take 1,000 mg by mouth 2 (two) times daily with a meal.     NOVOLOG FLEXPEN [place]  Inject 3 Units into the skin daily as needed (High Blood Sugar).     pantoprazole 40 MG tablet  Commonly known as:  PROTONIX  Take 40 mg by mouth daily.     pioglitazone 15 MG tablet  Commonly known as:  ACTOS  Take 15 mg by mouth daily.     rosuvastatin 20 MG tablet  Commonly known as:  CRESTOR  Take 1 tablet (20 mg total) by mouth daily.     tamsulosin 0.4 MG Caps capsule  Commonly known as:  FLOMAX  Take 0.4 mg by mouth daily.     verapamil 180 MG CR tablet  Commonly known as:  CALAN-SR  Take 180 mg by mouth 2 (two) times daily.        Prescriptions given: None-pt has pain medication left over from last surgery.  He only required 2 pain pills with last surgery  Instructions: 1.  Shower daily with soap and water starting 06/27/15   Disposition: home  Patient's condition: is Good  Follow up: 1. Dr. Bridgett Larsson in 2 weeks.   Leontine Locket, PA-C [facility] [phone]  Addendum  I have independently interviewed and examined the patient, and I agree with the physician assistant's discharge summary.  This patient underwent a left carotid endarterectomy which was uneventful except for hypotensive reaction to protamine.  The patient's post-operative recovery was unremarkable and was discharge on POD #1.  He will follow up in the office in 2 weeks.  Adele Barthel, MD [facility] [place] Office: [phone] Pager: [phone]  06/26/2015, 8:27 AM   --- For VQI Registry use --- Instructions: Press F2 to tab through  selections.  Delete question if not applicable.   Modified Rankin score at D/C (0-6): 0  IV medication needed for:  1. Hypertension: No 2. Hypotension: No  Post-op Complications: No  1. Post-op CVA or TIA: No  If yes: Event classification (right  eye, left eye, right cortical, left cortical, verterobasilar, other): n/a  If yes: Timing of event (intra-op, <6 hrs post-op, >=6 hrs post-op, unknown): n/a  2. CN injury: No  If yes: CN n/a injuried   3. Myocardial infarction: No  If yes: Dx by (EKG or clinical, Troponin): n/a  4.  CHF: No  5.  Dysrhythmia (new): No  6. Wound infection: No  7. Reperfusion symptoms: No  8. Return to OR: No  If yes: return to OR for (bleeding, neurologic, other CEA incision, other): n/a  Discharge medications: Statin use:  Yes If No: [ ]  For Medical reasons, [ ]  Non-compliant, [ ]  Not-indicated ASA use:  Yes  If No: [ ]  For Medical reasons, [ ]  Non-compliant, [ ]  Not-indicated Beta blocker use:  No If No: [ ]  For Medical reasons, [ ]  Non-compliant, [ ]  Not-indicated ACE-Inhibitor use:  Yes If No: [ ]  For Medical reasons, [ ]  Non-compliant, [ ]  Not-indicated ARB use:  No P2Y12 Antagonist use: No, [ ]  Plavix, [ ]  Plasugrel, [ ]  Ticlopinine, [ ]  Ticagrelor, [ ]  Other, [ ]  No for medical reason, [ ]  Non-compliant, [ ]  Not-indicated Anti-coagulant use:  No, [ ]  Warfarin, [ ]  Rivaroxaban, [ ]  Dabigatran, [ ]  Other, [ ]  No for medical reason, [ ]  Non-compliant, [ ]  Not-indicated

## 2015-06-26 NOTE — Progress Notes (Addendum)
  Progress Note    06/26/2015 7:24 AM 1 Day Post-Op  Subjective:  States he's a little sore around the incision, but less than last time.  Swallowing okay  afebrile HR 80's-90's NSR 99991111 systolic  Filed Vitals:   06/26/15 0400 06/26/15 0700  BP: 116/77 159/75  Pulse: 87   Temp: 98.6 F (37 C)   Resp: 15 15     Physical Exam: Neuro:  In tact; tongue midline Lungs:  Non labored Incision:  Clean and dry without hematoma  CBC    Component Value Date/Time   WBC 8.1 06/26/2015 0416   WBC 5.6 04/30/2015 0813   RBC 3.90* 06/26/2015 0416   RBC 4.79 04/30/2015 0813   HGB 11.7* 06/26/2015 0416   HCT 36.3* 06/26/2015 0416   HCT 43.8 04/30/2015 0813   PLT 183 06/26/2015 0416   PLT 216 04/30/2015 0813   MCV 93.1 06/26/2015 0416   MCV 91 04/30/2015 0813   MCH 30.0 06/26/2015 0416   MCH 30.3 04/30/2015 0813   MCHC 32.2 06/26/2015 0416   MCHC 33.1 04/30/2015 0813   RDW 13.9 06/26/2015 0416   RDW 13.7 04/30/2015 0813    BMET    Component Value Date/Time   NA 141 06/26/2015 0416   NA 139 04/30/2015 0813   K 4.5 06/26/2015 0416   CL 108 06/26/2015 0416   CO2 25 06/26/2015 0416   GLUCOSE 123* 06/26/2015 0416   GLUCOSE 219* 04/30/2015 0813   BUN 12 06/26/2015 0416   BUN 26 04/30/2015 0813   CREATININE 1.05 06/26/2015 0416   CREATININE 1.31* 04/25/2015 1245   CALCIUM 8.6* 06/26/2015 0416   GFRNONAA >60 06/26/2015 0416   GFRAA >60 06/26/2015 0416     Intake/Output Summary (Last 24 hours) at 06/26/15 0724 Last data filed at 06/26/15 0500  Gross per 24 hour  Intake   3050 ml  Output   1850 ml  Net   1200 ml     Assessment/Plan:  This is a 75 y.o. male who is s/p left CEA 1 Day Post-Op  -pt is doing well this am. -pt neuro exam is in tact -pt has ambulated -pt has voided -f/u with Dr. Bridgett Larsson in 2 weeks. -d/c home today   Leontine Locket, PA-C [facility] [phone]  Addendum  I have independently interviewed and examined the  patient, and I agree with the physician assistant's findings.  Hemodynamics stabilized rapidly with just fluid boluses.  Neuro intact with sym motor and midline tongue.  Ok to go home  Adele Barthel, MD [facility] [place] Office: [phone] Pager: [phone]  06/26/2015, 8:26 AM

## 2015-06-26 NOTE — Telephone Encounter (Signed)
sched appt 5/18 at 8:30. Spoke to pt to inform them.

## 2015-06-26 NOTE — Telephone Encounter (Signed)
-----   Message from Mena Goes, RN sent at 06/25/2015 12:34 PM EDT ----- Regarding: schedule   ----- Message -----    From: Gabriel Earing, PA-C    Sent: 06/25/2015  11:08 AM      To: [facility] Charge Pool  S/p left CEA 06/25/15.  F/u with Dr. Bridgett Larsson in 2 weeks.  Thanks

## 2015-06-26 NOTE — Progress Notes (Signed)
Explained and discussed discharge instructions, f/u appt given, no prescriptions prescribed. No voice complained at this time. Pt going home with wife via w/c with belongings.

## 2015-07-02 ENCOUNTER — Telehealth: Payer: Self-pay

## 2015-07-02 NOTE — Telephone Encounter (Signed)
Pt. Called to state he has a tingling in his throat, and a cough when he lays down or at night.  Stated he is coughing up phlegm, intermittently.  Stated he has noticed being more hoarse after this surgery, compared to the first carotid surgery.  Denied any SOB.  Denied fever.  Reported the incision is intact; denied redness or drainage.  Recommended to gargle with warm salt water, sip on warm tea with honey, or use throat lozenges to soothe his throat.  Reported he has had the cough since before surgery.  Encouraged to report the cough to his PCP for evaluation and recommendations.  Verb. Understanding, and agreed.

## 2015-07-04 ENCOUNTER — Encounter: Payer: Self-pay | Admitting: Vascular Surgery

## 2015-07-10 ENCOUNTER — Ambulatory Visit (INDEPENDENT_AMBULATORY_CARE_PROVIDER_SITE_OTHER): Payer: Medicare Other | Admitting: Vascular Surgery

## 2015-07-10 ENCOUNTER — Encounter: Payer: Self-pay | Admitting: Vascular Surgery

## 2015-07-10 VITALS — BP 119/61 | HR 81 | Temp 97.0°F | Resp 18 | Ht 71.0 in | Wt 186.0 lb

## 2015-07-10 DIAGNOSIS — I779 Disorder of arteries and arterioles, unspecified: Secondary | ICD-10-CM

## 2015-07-10 DIAGNOSIS — I739 Peripheral vascular disease, unspecified: Principal | ICD-10-CM

## 2015-07-10 NOTE — Progress Notes (Signed)
Postoperative Visit   History of Present Illness  Jeremy Patton is a 75 y.o. male who presents for postoperative follow-up for: L CEA (Date: 06/25/15).  The patient is also s/p R CEA on 05/06/15.  The patient's neck incision is healing.  The patient has had no stroke or TIA symptoms.  For VQI Use Only  PRE-ADM LIVING: Home  AMB STATUS: Ambulatory  Social History   Social History  . Marital Status: Married    Spouse Name: N/A  . Number of Children: N/A  . Years of Education: N/A   Occupational History  . Not on file.   Social History Main Topics  . Smoking status: Former Smoker -- 2.00 packs/day for 30 years    Types: Cigarettes    Quit date: 12/17/1991  . Smokeless tobacco: Never Used  . Alcohol Use: No  . Drug Use: No  . Sexual Activity: Not on file   Other Topics Concern  . Not on file   Social History Narrative    Current Outpatient Prescriptions on File Prior to Visit  Medication Sig Dispense Refill  . acetaminophen (TYLENOL) 500 MG tablet Take 1,000 mg by mouth every 6 (six) hours as needed for moderate pain.    Marland Kitchen aspirin 81 MG tablet Take 81 mg by mouth daily.    . chlorproMAZINE (THORAZINE) 25 MG tablet Take 25 mg by mouth 4 (four) times daily as needed for nausea. Reported on 05/22/2015    . gabapentin (NEURONTIN) 300 MG capsule Take 300 mg by mouth 2 (two) times daily.     . Insulin Aspart (NOVOLOG FLEXPEN [place]) Inject 3 Units into the skin daily as needed (High Blood Sugar).     Marland Kitchen LANTUS SOLOSTAR 100 UNIT/ML Solostar Pen Inject 30 Units into the skin every morning.     Marland Kitchen lisinopril-hydrochlorothiazide (PRINZIDE,ZESTORETIC) 20-12.5 MG per tablet Take 2 tablets by mouth daily.     . metFORMIN (GLUCOPHAGE) 1000 MG tablet Take 1,000 mg by mouth 2 (two) times daily with a meal.     . pantoprazole (PROTONIX) 40 MG tablet Take 40 mg by mouth daily.     . pioglitazone (ACTOS) 15 MG tablet Take 15 mg by mouth daily.     . rosuvastatin (CRESTOR) 20 MG tablet  Take 1 tablet (20 mg total) by mouth daily. 30 tablet 6  . Tamsulosin HCl (FLOMAX) 0.4 MG CAPS Take 0.4 mg by mouth daily.     . verapamil (CALAN-SR) 180 MG CR tablet Take 180 mg by mouth 2 (two) times daily.     No current facility-administered medications on file prior to visit.    Physical Examination  Filed Vitals:   07/10/15 0821 07/10/15 0823  BP: 121/60 119/61  Pulse: 81   Temp: 97 F (36.1 C)   Resp: 18     L Neck: Incision is healing, inc c/d/i Neuro: CN 2-12 are intact , Motor strength is 5/5 bilaterally, sensation is grossly intact  Medical Decision Making  Jeremy Patton is a 75 y.o. male who presents s/p L CEA and s/p R CEA (previous)   The patient's neck incision is healing with no stroke symptoms. I discussed in depth with the patient the nature of atherosclerosis, and emphasized the importance of maximal medical management including strict control of blood pressure, blood glucose, and lipid levels, obtaining regular exercise, anti-platelet use and cessation of smoking.   The patient is currently on an antiplatelet: ASA. The patient is currently on a statin:  Crestor. The patient is aware that without maximal medical management the underlying atherosclerotic disease process will progress, limiting the benefit of any interventions. The patient's surveillance will included routine carotid duplex studies which will be completed in: 9 months, at which time the patient will be re-evaluated.   I emphasized the importance of routine surveillance of the carotid arteries as recurrence of stenosis is possible, especially with proper management of underlying atherosclerotic disease. The patient agrees to participate in their maximal medical care and routine surveillance.  Thank you for allowing [place] to participate in this patient's care.  Adele Barthel, MD, FACS [facility] Office: [phone] Pager: [phone]  07/10/2015, 8:40  AM

## 2015-07-14 ENCOUNTER — Other Ambulatory Visit: Payer: Self-pay | Admitting: *Deleted

## 2015-07-14 MED ORDER — ROSUVASTATIN CALCIUM 20 MG PO TABS: 20.0000 mg | ORAL_TABLET | Freq: Every day | ORAL | Status: DC

## 2015-07-14 NOTE — Telephone Encounter (Signed)
Rx has been sent to the pharmacy electronically. ° °

## 2015-09-23 ENCOUNTER — Other Ambulatory Visit: Payer: Self-pay | Admitting: Vascular Surgery

## 2015-09-23 DIAGNOSIS — I739 Peripheral vascular disease, unspecified: Principal | ICD-10-CM

## 2015-09-23 DIAGNOSIS — I779 Disorder of arteries and arterioles, unspecified: Secondary | ICD-10-CM

## 2015-10-22 ENCOUNTER — Encounter: Payer: Self-pay | Admitting: Internal Medicine

## 2015-11-03 ENCOUNTER — Other Ambulatory Visit (HOSPITAL_COMMUNITY): Payer: Self-pay | Admitting: Internal Medicine

## 2015-11-03 ENCOUNTER — Ambulatory Visit (HOSPITAL_COMMUNITY)
Admission: RE | Admit: 2015-11-03 | Discharge: 2015-11-03 | Disposition: A | Discharge status: Home / Self Care | Payer: Medicare Other | Source: Ambulatory Visit | Attending: Internal Medicine | Admitting: Internal Medicine

## 2015-11-03 DIAGNOSIS — I700 Atherosclerosis of aorta: Secondary | ICD-10-CM | POA: Diagnosis not present

## 2015-11-03 DIAGNOSIS — R05 Cough: Secondary | ICD-10-CM

## 2015-11-03 DIAGNOSIS — R059 Cough, unspecified: Secondary | ICD-10-CM

## 2015-11-03 DIAGNOSIS — J9811 Atelectasis: Secondary | ICD-10-CM | POA: Diagnosis not present

## 2015-11-28 ENCOUNTER — Ambulatory Visit (INDEPENDENT_AMBULATORY_CARE_PROVIDER_SITE_OTHER): Payer: Medicare Other | Admitting: Ophthalmology

## 2015-11-28 DIAGNOSIS — I10 Essential (primary) hypertension: Secondary | ICD-10-CM

## 2015-11-28 DIAGNOSIS — E113593 Type 2 diabetes mellitus with proliferative diabetic retinopathy without macular edema, bilateral: Secondary | ICD-10-CM | POA: Diagnosis not present

## 2015-11-28 DIAGNOSIS — E11319 Type 2 diabetes mellitus with unspecified diabetic retinopathy without macular edema: Secondary | ICD-10-CM | POA: Diagnosis not present

## 2015-11-28 DIAGNOSIS — H43813 Vitreous degeneration, bilateral: Secondary | ICD-10-CM | POA: Diagnosis not present

## 2015-11-28 DIAGNOSIS — H35033 Hypertensive retinopathy, bilateral: Secondary | ICD-10-CM

## 2016-03-24 ENCOUNTER — Telehealth: Payer: Self-pay

## 2016-04-01 ENCOUNTER — Other Ambulatory Visit: Payer: Self-pay | Admitting: Cardiovascular Disease

## 2016-04-01 NOTE — Telephone Encounter (Signed)
Rx(s) sent to pharmacy electronically.  

## 2016-04-16 ENCOUNTER — Ambulatory Visit: Payer: Medicare Other

## 2016-04-16 ENCOUNTER — Encounter (HOSPITAL_COMMUNITY): Payer: Medicare Other

## 2016-04-16 ENCOUNTER — Ambulatory Visit: Payer: Medicare Other | Admitting: Vascular Surgery

## 2016-06-03 ENCOUNTER — Ambulatory Visit (INDEPENDENT_AMBULATORY_CARE_PROVIDER_SITE_OTHER): Payer: Medicare Other | Admitting: Ophthalmology

## 2016-06-03 DIAGNOSIS — H35033 Hypertensive retinopathy, bilateral: Secondary | ICD-10-CM

## 2016-06-03 DIAGNOSIS — E10319 Type 1 diabetes mellitus with unspecified diabetic retinopathy without macular edema: Secondary | ICD-10-CM

## 2016-06-03 DIAGNOSIS — H43813 Vitreous degeneration, bilateral: Secondary | ICD-10-CM

## 2016-06-03 DIAGNOSIS — E103593 Type 1 diabetes mellitus with proliferative diabetic retinopathy without macular edema, bilateral: Secondary | ICD-10-CM

## 2016-06-03 DIAGNOSIS — I10 Essential (primary) hypertension: Secondary | ICD-10-CM | POA: Diagnosis not present

## 2016-06-08 ENCOUNTER — Emergency Department (HOSPITAL_COMMUNITY): Payer: Medicare Other

## 2016-06-08 ENCOUNTER — Inpatient Hospital Stay (HOSPITAL_COMMUNITY)
Admission: EM | Admit: 2016-06-08 | Discharge: 2016-06-10 | DRG: 291 | Disposition: A | Discharge status: Home / Self Care | Payer: Medicare Other | Attending: Internal Medicine | Admitting: Internal Medicine

## 2016-06-08 ENCOUNTER — Encounter (HOSPITAL_COMMUNITY): Payer: Self-pay

## 2016-06-08 DIAGNOSIS — Z8249 Family history of ischemic heart disease and other diseases of the circulatory system: Secondary | ICD-10-CM

## 2016-06-08 DIAGNOSIS — Z888 Allergy status to other drugs, medicaments and biological substances status: Secondary | ICD-10-CM | POA: Diagnosis not present

## 2016-06-08 DIAGNOSIS — Z86010 Personal history of colonic polyps: Secondary | ICD-10-CM

## 2016-06-08 DIAGNOSIS — Z7982 Long term (current) use of aspirin: Secondary | ICD-10-CM | POA: Diagnosis not present

## 2016-06-08 DIAGNOSIS — Z9841 Cataract extraction status, right eye: Secondary | ICD-10-CM | POA: Diagnosis not present

## 2016-06-08 DIAGNOSIS — K219 Gastro-esophageal reflux disease without esophagitis: Secondary | ICD-10-CM | POA: Diagnosis present

## 2016-06-08 DIAGNOSIS — Z794 Long term (current) use of insulin: Secondary | ICD-10-CM

## 2016-06-08 DIAGNOSIS — I10 Essential (primary) hypertension: Secondary | ICD-10-CM | POA: Diagnosis not present

## 2016-06-08 DIAGNOSIS — I248 Other forms of acute ischemic heart disease: Secondary | ICD-10-CM | POA: Diagnosis present

## 2016-06-08 DIAGNOSIS — Z9842 Cataract extraction status, left eye: Secondary | ICD-10-CM | POA: Diagnosis not present

## 2016-06-08 DIAGNOSIS — J189 Pneumonia, unspecified organism: Secondary | ICD-10-CM | POA: Diagnosis present

## 2016-06-08 DIAGNOSIS — I779 Disorder of arteries and arterioles, unspecified: Secondary | ICD-10-CM | POA: Diagnosis present

## 2016-06-08 DIAGNOSIS — R0902 Hypoxemia: Secondary | ICD-10-CM

## 2016-06-08 DIAGNOSIS — I110 Hypertensive heart disease with heart failure: Secondary | ICD-10-CM | POA: Diagnosis present

## 2016-06-08 DIAGNOSIS — Z823 Family history of stroke: Secondary | ICD-10-CM | POA: Diagnosis not present

## 2016-06-08 DIAGNOSIS — I5031 Acute diastolic (congestive) heart failure: Secondary | ICD-10-CM | POA: Diagnosis present

## 2016-06-08 DIAGNOSIS — M5416 Radiculopathy, lumbar region: Secondary | ICD-10-CM | POA: Diagnosis present

## 2016-06-08 DIAGNOSIS — Z961 Presence of intraocular lens: Secondary | ICD-10-CM | POA: Diagnosis present

## 2016-06-08 DIAGNOSIS — Z87891 Personal history of nicotine dependence: Secondary | ICD-10-CM | POA: Diagnosis not present

## 2016-06-08 DIAGNOSIS — E1159 Type 2 diabetes mellitus with other circulatory complications: Secondary | ICD-10-CM | POA: Diagnosis not present

## 2016-06-08 DIAGNOSIS — Z800 Family history of malignant neoplasm of digestive organs: Secondary | ICD-10-CM | POA: Diagnosis not present

## 2016-06-08 DIAGNOSIS — Z7984 Long term (current) use of oral hypoglycemic drugs: Secondary | ICD-10-CM

## 2016-06-08 DIAGNOSIS — E785 Hyperlipidemia, unspecified: Secondary | ICD-10-CM | POA: Diagnosis present

## 2016-06-08 DIAGNOSIS — J9601 Acute respiratory failure with hypoxia: Secondary | ICD-10-CM | POA: Diagnosis present

## 2016-06-08 DIAGNOSIS — I340 Nonrheumatic mitral (valve) insufficiency: Secondary | ICD-10-CM | POA: Diagnosis present

## 2016-06-08 DIAGNOSIS — R0602 Shortness of breath: Secondary | ICD-10-CM | POA: Diagnosis present

## 2016-06-08 DIAGNOSIS — I509 Heart failure, unspecified: Secondary | ICD-10-CM | POA: Diagnosis not present

## 2016-06-08 DIAGNOSIS — I739 Peripheral vascular disease, unspecified: Secondary | ICD-10-CM

## 2016-06-08 LAB — PROTIME-INR
INR: 0.98
Prothrombin Time: 13 seconds (ref 11.4–15.2)

## 2016-06-08 LAB — CBC WITH DIFFERENTIAL/PLATELET
BASOS ABS: 0 10*3/uL (ref 0.0–0.1)
BASOS PCT: 0 %
EOS ABS: 0.2 10*3/uL (ref 0.0–0.7)
Eosinophils Relative: 2 %
HEMATOCRIT: 36.2 % — AB (ref 39.0–52.0)
HEMOGLOBIN: 12.1 g/dL — AB (ref 13.0–17.0)
Lymphocytes Relative: 20 %
Lymphs Abs: 2 10*3/uL (ref 0.7–4.0)
MCH: 29.1 pg (ref 26.0–34.0)
MCHC: 33.4 g/dL (ref 30.0–36.0)
MCV: 87 fL (ref 78.0–100.0)
MONO ABS: 1 10*3/uL (ref 0.1–1.0)
MONOS PCT: 10 %
NEUTROS ABS: 6.8 10*3/uL (ref 1.7–7.7)
NEUTROS PCT: 68 %
Platelets: 318 10*3/uL (ref 150–400)
RBC: 4.16 MIL/uL — ABNORMAL LOW (ref 4.22–5.81)
RDW: 14.9 % (ref 11.5–15.5)
WBC: 10.1 10*3/uL (ref 4.0–10.5)

## 2016-06-08 LAB — COMPREHENSIVE METABOLIC PANEL
ALBUMIN: 3.2 g/dL — AB (ref 3.5–5.0)
ALT: 18 U/L (ref 17–63)
ANION GAP: 9 (ref 5–15)
AST: 18 U/L (ref 15–41)
Alkaline Phosphatase: 62 U/L (ref 38–126)
BUN: 16 mg/dL (ref 6–20)
CO2: 25 mmol/L (ref 22–32)
Calcium: 9 mg/dL (ref 8.9–10.3)
Chloride: 107 mmol/L (ref 101–111)
Creatinine, Ser: 1.09 mg/dL (ref 0.61–1.24)
GFR calc Af Amer: >60 mL/min (ref >60)
GFR calc non Af Amer: >60 mL/min (ref >60)
GLUCOSE: 121 mg/dL — AB (ref 65–99)
POTASSIUM: 3.7 mmol/L (ref 3.5–5.1)
SODIUM: 141 mmol/L (ref 135–145)
Total Bilirubin: 0.3 mg/dL (ref 0.3–1.2)
Total Protein: 6.9 g/dL (ref 6.5–8.1)

## 2016-06-08 LAB — TROPONIN I
TROPONIN I: 0.12 ng/mL — AB (ref <0.03)
TROPONIN I: 0.12 ng/mL — AB (ref <0.03)

## 2016-06-08 LAB — BRAIN NATRIURETIC PEPTIDE: B Natriuretic Peptide: 433 pg/mL — ABNORMAL HIGH (ref 0.0–100.0)

## 2016-06-08 LAB — CBG MONITORING, ED: Glucose-Capillary: 80 mg/dL (ref 65–99)

## 2016-06-08 LAB — TSH: TSH: 0.857 u[IU]/mL (ref 0.350–4.500)

## 2016-06-08 LAB — GLUCOSE, CAPILLARY: GLUCOSE-CAPILLARY: 144 mg/dL — AB (ref 65–99)

## 2016-06-08 MED ORDER — LISINOPRIL-HYDROCHLOROTHIAZIDE 20-12.5 MG PO TABS: 2.0000 | ORAL_TABLET | Freq: Every day | ORAL | Status: DC

## 2016-06-08 MED ORDER — INSULIN GLARGINE 100 UNIT/ML ~~LOC~~ SOLN
30.0000 [IU] | Freq: Every day | SUBCUTANEOUS | Status: DC
  Administered 2016-06-09 – 2016-06-10 (×2): 30 [IU] via SUBCUTANEOUS
  Filled 2016-06-08 (×3): qty 0.3

## 2016-06-08 MED ORDER — INSULIN ASPART 100 UNIT/ML ~~LOC~~ SOLN
0.0000 [IU] | Freq: Three times a day (TID) | SUBCUTANEOUS | Status: DC
  Administered 2016-06-09: 1 [IU] via SUBCUTANEOUS

## 2016-06-08 MED ORDER — ACETAMINOPHEN 325 MG PO TABS: 650.0000 mg | ORAL_TABLET | ORAL | Status: DC | PRN

## 2016-06-08 MED ORDER — HYDROCHLOROTHIAZIDE 25 MG PO TABS: 25.0000 mg | ORAL_TABLET | Freq: Every day | ORAL | Status: DC

## 2016-06-08 MED ORDER — PANTOPRAZOLE SODIUM 40 MG PO TBEC
40.0000 mg | DELAYED_RELEASE_TABLET | Freq: Every day | ORAL | Status: DC
  Administered 2016-06-09 – 2016-06-10 (×2): 40 mg via ORAL
  Filled 2016-06-08 (×2): qty 1

## 2016-06-08 MED ORDER — LISINOPRIL 10 MG PO TABS
40.0000 mg | ORAL_TABLET | Freq: Every day | ORAL | Status: DC
  Administered 2016-06-09: 40 mg via ORAL
  Filled 2016-06-08 (×2): qty 4

## 2016-06-08 MED ORDER — FUROSEMIDE 10 MG/ML IJ SOLN
40.0000 mg | Freq: Every day | INTRAMUSCULAR | Status: DC
  Administered 2016-06-09: 40 mg via INTRAVENOUS
  Filled 2016-06-08 (×2): qty 4

## 2016-06-08 MED ORDER — SODIUM CHLORIDE 0.9 % IV SOLN: 250.0000 mL | INTRAVENOUS | Status: DC | PRN

## 2016-06-08 MED ORDER — VERAPAMIL HCL ER 180 MG PO TBCR
180.0000 mg | EXTENDED_RELEASE_TABLET | Freq: Two times a day (BID) | ORAL | Status: DC
  Administered 2016-06-08 – 2016-06-10 (×4): 180 mg via ORAL
  Filled 2016-06-08 (×4): qty 1

## 2016-06-08 MED ORDER — FUROSEMIDE 10 MG/ML IJ SOLN
40.0000 mg | Freq: Once | INTRAMUSCULAR | Status: AC
  Administered 2016-06-08: 40 mg via INTRAVENOUS
  Filled 2016-06-08: qty 4

## 2016-06-08 MED ORDER — INSULIN ASPART 100 UNIT/ML ~~LOC~~ SOLN
3.0000 [IU] | Freq: Every day | SUBCUTANEOUS | Status: DC
  Administered 2016-06-09 – 2016-06-10 (×2): 3 [IU] via SUBCUTANEOUS

## 2016-06-08 MED ORDER — ASPIRIN 81 MG PO CHEW
81.0000 mg | CHEWABLE_TABLET | Freq: Every day | ORAL | Status: DC
  Administered 2016-06-09 – 2016-06-10 (×2): 81 mg via ORAL
  Filled 2016-06-08 (×2): qty 1

## 2016-06-08 MED ORDER — ENOXAPARIN SODIUM 100 MG/ML ~~LOC~~ SOLN
1.0000 mg/kg | Freq: Two times a day (BID) | SUBCUTANEOUS | Status: DC
  Administered 2016-06-08 – 2016-06-10 (×3): 90 mg via SUBCUTANEOUS
  Filled 2016-06-08 (×4): qty 1

## 2016-06-08 MED ORDER — CHLORPROMAZINE HCL 25 MG PO TABS
25.0000 mg | ORAL_TABLET | Freq: Four times a day (QID) | ORAL | Status: DC | PRN
Start: 2016-06-08 — End: 2016-06-10
  Administered 2016-06-09 – 2016-06-10 (×2): 25 mg via ORAL
  Filled 2016-06-08 (×3): qty 1

## 2016-06-08 MED ORDER — GABAPENTIN 300 MG PO CAPS
300.0000 mg | ORAL_CAPSULE | Freq: Two times a day (BID) | ORAL | Status: DC
  Administered 2016-06-08 – 2016-06-09 (×3): 300 mg via ORAL
  Filled 2016-06-08 (×3): qty 1

## 2016-06-08 MED ORDER — ACETAMINOPHEN 500 MG PO TABS
1000.0000 mg | ORAL_TABLET | Freq: Every morning | ORAL | Status: DC
  Administered 2016-06-09 – 2016-06-10 (×2): 1000 mg via ORAL
  Filled 2016-06-08 (×2): qty 2

## 2016-06-08 MED ORDER — ALBUTEROL SULFATE (2.5 MG/3ML) 0.083% IN NEBU
5.0000 mg | INHALATION_SOLUTION | Freq: Once | RESPIRATORY_TRACT | Status: AC
  Administered 2016-06-08: 5 mg via RESPIRATORY_TRACT
  Filled 2016-06-08: qty 6

## 2016-06-08 MED ORDER — SODIUM CHLORIDE 0.9% FLUSH: 3.0000 mL | INTRAVENOUS | Status: DC | PRN

## 2016-06-08 MED ORDER — ONDANSETRON HCL 4 MG/2ML IJ SOLN: 4.0000 mg | Freq: Four times a day (QID) | INTRAMUSCULAR | Status: DC | PRN

## 2016-06-08 MED ORDER — TAMSULOSIN HCL 0.4 MG PO CAPS
0.4000 mg | ORAL_CAPSULE | Freq: Every day | ORAL | Status: DC
  Administered 2016-06-09 – 2016-06-10 (×2): 0.4 mg via ORAL
  Filled 2016-06-08 (×2): qty 1

## 2016-06-08 MED ORDER — SODIUM CHLORIDE 0.9% FLUSH
3.0000 mL | Freq: Two times a day (BID) | INTRAVENOUS | Status: DC
  Administered 2016-06-08 – 2016-06-09 (×3): 3 mL via INTRAVENOUS

## 2016-06-08 MED ORDER — ASPIRIN 325 MG PO TABS
325.0000 mg | ORAL_TABLET | Freq: Once | ORAL | Status: AC
  Administered 2016-06-08: 325 mg via ORAL
  Filled 2016-06-08: qty 1

## 2016-06-08 NOTE — ED Notes (Signed)
CRITICAL VALUE ALERT  Critical value received:  Troponin - 0.12  Date of notification:  06/08/2016  Time of notification:  1846  Critical value read back: yes  Nurse who received alert:  LJS  MD notified (1st page):  Dr Dayna Barker  Time of first page:  1846  MD notified (2nd page):  Time of second page:  Responding MD:  Dr Dayna Barker  Time MD responded:  [phone]

## 2016-06-08 NOTE — H&P (Signed)
History and Physical    EMIR NACK FXJ:[id number] DOB: November 21, 1940 DOA: 06/08/2016  PCP: Asencion Noble, MD  Patient coming from:  home  Chief Complaint:   sob  HPI: Jeremy Patton is a 76 y.o. male with medical history significant of  HTN, DM, CAS comes in with over 2 weeks of progressive worsening sob and DOE with orthopnea for about a week.  He reports his normal weight is about 185lbs and he is 197lbs today,  He feels like he is swelling in his legs some and abdomen.  Denies fevers or cough.  No chest pain ever in the last month.  No h/o CHF in his past.  Pt went to see his PCP Jeremy Patton today who was concerned and sent him to the ED.  He normally walks about 45 minutes a day to exercise and he has not been able to hardly walk at all without getting very sob.  Pt found to be in new onset CHF and hypoxic.  Referred for admission for management and further work up.   Review of Systems: As per HPI otherwise 10 point review of systems negative.   Past Medical History:  Diagnosis Date  . Carotid artery occlusion    Bruit, Right  . Diabetes mellitus without complication ([place]) 64/02/5828  . GERD (gastroesophageal reflux disease)   . Hyperlipidemia 11/29/2011  . Hypertension 11/29/2011  . Low back pain 11/29/2011  . Lumbar radiculopathy 11/29/2011   Left  . Pain of left lower leg   . Wears glasses     Past Surgical History:  Procedure Laterality Date  . CATARACT EXTRACTION W/ INTRAOCULAR LENS  IMPLANT, BILATERAL    . CIRCUMCISION     Age 70  . COLONOSCOPY  08/22/2005 and  07/31/2009   minimal internal hemorrhoids, left-sided diverticulosis  . COLONOSCOPY N/A 11/25/2014   Procedure: COLONOSCOPY;  Surgeon: Daneil Dolin, MD;  [facility];  Service: Endoscopy;  Laterality: N/A;  1030  . ENDARTERECTOMY Right 05/06/2015   Procedure: Right Carotid ENDARTERECTOMY ;  Surgeon: Conrad Mendota, MD;  Location: [facility];  Service: Vascular;  Laterality: Right;  . ENDARTERECTOMY Left  06/25/2015   Procedure: LEFT CAROTID ENDARTERECTOMY;  Surgeon: Conrad Claysville, MD;  Location: [facility];  Service: Vascular;  Laterality: Left;  . ESOPHAGEAL DILATION N/A 11/25/2014   Procedure: ESOPHAGEAL DILATION;  Surgeon: Daneil Dolin, MD;  [facility];  Service: Endoscopy;  Laterality: N/A;  . ESOPHAGOGASTRODUODENOSCOPY N/A 11/25/2014   Procedure: ESOPHAGOGASTRODUODENOSCOPY (EGD);  Surgeon: Daneil Dolin, MD;  [facility];  Service: Endoscopy;  Laterality: N/A;  . EYE SURGERY    . MULTIPLE TOOTH EXTRACTIONS    . PATCH ANGIOPLASTY Right 05/06/2015   Procedure: PATCH ANGIOPLASTY;  Surgeon: Conrad Romeoville, MD;  Location: [facility];  Service: Vascular;  Laterality: Right;  . PATCH ANGIOPLASTY Left 06/25/2015   Procedure: PATCH ANGIOPLASTY USING XENOSURE BIOLOGIC PATCH 1cm X 6cm ;  Surgeon: Conrad Quintana, MD;  Location: [facility];  Service: Vascular;  Laterality: Left;  . TONSILLECTOMY       reports that he quit smoking about 24 years ago. His smoking use included Cigarettes. He has a 60.00 pack-year smoking history. He has never used smokeless tobacco. He reports that he does not drink alcohol or use drugs.  Allergies  Allergen Reactions  . Protamine     hypotension    Family History  Problem Relation Age of Onset  . Stroke Mother   .  Heart disease Mother     before age 81  . Colon cancer Brother     Age 75    Prior to Admission medications   Medication Sig Start Date End Date Taking? Authorizing Provider  acetaminophen (TYLENOL) 500 MG tablet Take 1,000 mg by mouth every morning.    Yes Historical Provider, MD  aspirin 81 MG tablet Take 81 mg by mouth daily.   Yes Historical Provider, MD  chlorproMAZINE (THORAZINE) 25 MG tablet Take 25 mg by mouth 4 (four) times daily as needed for nausea. Reported on 05/22/2015   Yes Historical Provider, MD  gabapentin (NEURONTIN) 300 MG capsule Take 300 mg by mouth 2 (two) times daily.  11/24/11  Yes Historical Provider, MD  insulin aspart  (NOVOLOG FLEXPEN) 100 UNIT/ML FlexPen Inject 3 Units into the skin every morning.   Yes Historical Provider, MD  LANTUS SOLOSTAR 100 UNIT/ML Solostar Pen Inject 30 Units into the skin every morning.  05/15/15  Yes Historical Provider, MD  lisinopril-hydrochlorothiazide (PRINZIDE,ZESTORETIC) 20-12.5 MG per tablet Take 2 tablets by mouth daily.  11/24/11  Yes Historical Provider, MD  metFORMIN (GLUCOPHAGE) 1000 MG tablet Take 1,000 mg by mouth 2 (two) times daily with a meal.  11/24/11  Yes Historical Provider, MD  pantoprazole (PROTONIX) 40 MG tablet Take 40 mg by mouth daily.  04/18/15  Yes Historical Provider, MD  pioglitazone (ACTOS) 15 MG tablet Take 15 mg by mouth daily.  04/18/15  Yes Historical Provider, MD  rosuvastatin (CRESTOR) 20 MG tablet Take 1 tablet (20 mg total) by mouth daily. <PLEASE MAKE APPOINTMENT FOR REFILLS> Patient taking differently: Take 20 mg by mouth daily.  04/01/16  Yes Troy Sine, MD  Tamsulosin HCl (FLOMAX) 0.4 MG CAPS Take 0.4 mg by mouth daily.  11/24/11  Yes Historical Provider, MD  verapamil (CALAN-SR) 180 MG CR tablet Take 180 mg by mouth 2 (two) times daily.   Yes Historical Provider, MD    Physical Exam: Vitals:   06/08/16 1800 06/08/16 1807 06/08/16 1830 06/08/16 1930  BP: (!) 111/53  (!) 104/50 (!) 108/52  Pulse: 78  85 82  Resp: (!) 27  (!) 30 (!) 23  Temp:      TempSrc:      SpO2: 99% 93% 95% 94%  Weight:      Height:         Constitutional: NAD, calm, comfortable Vitals:   06/08/16 1800 06/08/16 1807 06/08/16 1830 06/08/16 1930  BP: (!) 111/53  (!) 104/50 (!) 108/52  Pulse: 78  85 82  Resp: (!) 27  (!) 30 (!) 23  Temp:      TempSrc:      SpO2: 99% 93% 95% 94%  Weight:      Height:       Eyes: PERRL, lids and conjunctivae normal ENMT: Mucous membranes are moist. Posterior pharynx clear of any exudate or lesions.Normal dentition.  Neck: normal, supple, no masses, no thyromegaly Respiratory: clear to auscultation bilaterally, no wheezing,  with bilateral crackles. Normal respiratory effort. No accessory muscle use.  Cardiovascular: Regular rate and rhythm, no murmurs / rubs / gallops. Trace extremity edema. 2+ pedal pulses. No carotid bruits.  Abdomen: no tenderness, no masses palpated. No hepatosplenomegaly. Bowel sounds positive.  Musculoskeletal: no clubbing / cyanosis. No joint deformity upper and lower extremities. Good ROM, no contractures. Normal muscle tone.  Skin: no rashes, lesions, ulcers. No induration Neurologic: CN 2-12 grossly intact. Sensation intact, DTR normal. Strength 5/5 in all 4.  Psychiatric: Normal judgment and insight. Alert and oriented x 3. Normal mood.    Labs on Admission: I have personally reviewed following labs and imaging studies  CBC:  Recent Labs Lab 06/08/16 1737  WBC 10.1  NEUTROABS 6.8  HGB 12.1*  HCT 36.2*  MCV 87.0  PLT 937   Basic Metabolic Panel:  Recent Labs Lab 06/08/16 1737  NA 141  K 3.7  CL 107  CO2 25  GLUCOSE 121*  BUN 16  CREATININE 1.09  CALCIUM 9.0   GFR: Estimated Creatinine Clearance: 59.5 mL/min (by C-G formula based on SCr of 1.09 mg/dL). Liver Function Tests:  Recent Labs Lab 06/08/16 1737  AST 18  ALT 18  ALKPHOS 62  BILITOT 0.3  PROT 6.9  ALBUMIN 3.2*   Cardiac Enzymes:  Recent Labs Lab 06/08/16 1737  TROPONINI 0.12*   CBG:  Recent Labs Lab 06/08/16 1949  GLUCAP 80   Urine analysis:    Component Value Date/Time   COLORURINE YELLOW 06/10/2015 1012   APPEARANCEUR CLEAR 06/10/2015 1012   LABSPEC 1.016 06/10/2015 1012   PHURINE 6.0 06/10/2015 1012   GLUCOSEU 100 (A) 06/10/2015 1012   HGBUR NEGATIVE 06/10/2015 1012   BILIRUBINUR NEGATIVE 06/10/2015 1012   KETONESUR NEGATIVE 06/10/2015 1012   PROTEINUR NEGATIVE 06/10/2015 1012   UROBILINOGEN 0.2 04/13/2009 1306   NITRITE NEGATIVE 06/10/2015 1012   LEUKOCYTESUR NEGATIVE 06/10/2015 1012   Radiological Exams on Admission: Dg Chest 2 View  Result Date:  06/08/2016 CLINICAL DATA:  Subacute onset of shortness of breath on exertion and cough. Initial encounter. EXAM: CHEST  2 VIEW COMPARISON:  Chest radiograph performed 11/03/2015 FINDINGS: The lungs are well-aerated. Vascular congestion is noted. Patchy right midlung airspace opacity raises concern for pneumonia. Increased interstitial markings raise question for superimposed interstitial edema. Small bilateral pleural effusions are suggested. There is no evidence of pneumothorax. The heart is normal in size; the mediastinal contour is within normal limits. No acute osseous abnormalities are seen. IMPRESSION: Vascular congestion. Patchy right midlung airspace opacity raises concern for pneumonia. Increased interstitial markings raise question for superimposed interstitial edema. Small bilateral pleural effusions suggested. Electronically Signed   By: Garald Balding M.D.   On: 06/08/2016 18:54    EKG: Independently reviewed. nsr no acute issues cxr reviewed bilateral edema Old chart reviewed Case discussed with edp  Assessment/Plan 76 yo male with new onset CHF  Principal Problem:   CHF exacerbation ([place])- will obtain echo in the am.  Has had recent echo over a year ago which was normal.  Pt did have a stress test done preop for carotid surgery which showed no ischemia done in 3/17.  There was a fixed defect thought to be due to diaphragmatic attenuation, EF was normal.  Pt has no h/o cath in the past.  Place on lasix 40mg  iv q 24 hours (niave), serial trop and cover with full lovenox until trend troponin levels.  Obtain cardiology evaluation in the am to determine the need for further work up.   Active Problems:   Acute respiratory failure with hypoxia (HCC)- oxygen sats better on 2 liters.  Wean as tolerates.  Due to chf as above.   GERD (gastroesophageal reflux disease)- stable   Carotid artery disease ([place])- noted   Essential hypertension- stable   Type 2 diabetes mellitus with circulatory  disorder, with long-term current use of insulin ([place])- SSI, cont lantus   PCP Jeremy fagan  DVT prophylaxis:  On full lovenox Code Status:  full Family Communication:  daughter Disposition Plan:  Per day team Consults called:   Cardiology requested for the am Admission status:   Admission due to degree of hypoxia   Ketura Sirek A MD [facility]  If 7PM-7AM, please contact night-coverage [email or web address] Password [facility]  06/08/2016, 8:23 PM

## 2016-06-08 NOTE — Progress Notes (Signed)
[place] for Lovenox Indication: chest pain/ACS  Allergies  Allergen Reactions  . Protamine     hypotension    Patient Measurements: Height: 5\' 11"  (180.3 cm) Weight: 195 lb (88.5 kg) IBW/kg (Calculated) : 75.3 HEPARIN DW (KG): 88.5  Vital Signs: Temp: 98.6 F (37 C) (04/17 2200) Temp Source: Oral (04/17 2200) BP: 119/55 (04/17 2200) Pulse Rate: 80 (04/17 2200)  Labs:  Recent Labs  06/08/16 1737  HGB 12.1*  HCT 36.2*  PLT 318  CREATININE 1.09  TROPONINI 0.12*    Estimated Creatinine Clearance: 59.5 mL/min (by C-G formula based on SCr of 1.09 mg/dL).   Medical History: Past Medical History:  Diagnosis Date  . Carotid artery occlusion    Bruit, Right  . Diabetes mellitus without complication ([place]) 53/03/9922  . GERD (gastroesophageal reflux disease)   . Hyperlipidemia 11/29/2011  . Hypertension 11/29/2011  . Low back pain 11/29/2011  . Lumbar radiculopathy 11/29/2011   Left  . Pain of left lower leg   . Wears glasses     Medications:  Prescriptions Prior to Admission  Medication Sig Dispense Refill Last Dose  . acetaminophen (TYLENOL) 500 MG tablet Take 1,000 mg by mouth every morning.    06/08/2016 at Unknown time  . aspirin 81 MG tablet Take 81 mg by mouth daily.   06/08/2016 at Unknown time  . chlorproMAZINE (THORAZINE) 25 MG tablet Take 25 mg by mouth 4 (four) times daily as needed for nausea. Reported on 05/22/2015   06/08/2016 at Unknown time  . gabapentin (NEURONTIN) 300 MG capsule Take 300 mg by mouth 2 (two) times daily.    06/08/2016 at Unknown time  . insulin aspart (NOVOLOG FLEXPEN) 100 UNIT/ML FlexPen Inject 3 Units into the skin every morning.   06/08/2016 at Unknown time  . LANTUS SOLOSTAR 100 UNIT/ML Solostar Pen Inject 30 Units into the skin every morning.    06/08/2016 at Unknown time  . lisinopril-hydrochlorothiazide (PRINZIDE,ZESTORETIC) 20-12.5 MG per tablet Take 2 tablets by mouth daily.    06/08/2016 at  Unknown time  . metFORMIN (GLUCOPHAGE) 1000 MG tablet Take 1,000 mg by mouth 2 (two) times daily with a meal.    06/08/2016 at Unknown time  . pantoprazole (PROTONIX) 40 MG tablet Take 40 mg by mouth daily.    06/08/2016 at Unknown time  . pioglitazone (ACTOS) 15 MG tablet Take 15 mg by mouth daily.    06/08/2016 at Unknown time  . rosuvastatin (CRESTOR) 20 MG tablet Take 1 tablet (20 mg total) by mouth daily. <PLEASE MAKE APPOINTMENT FOR REFILLS> (Patient taking differently: Take 20 mg by mouth daily. ) 90 tablet 0 06/08/2016 at Unknown time  . Tamsulosin HCl (FLOMAX) 0.4 MG CAPS Take 0.4 mg by mouth daily.    06/08/2016 at Unknown time  . verapamil (CALAN-SR) 180 MG CR tablet Take 180 mg by mouth 2 (two) times daily.   06/08/2016 at Unknown time    Assessment: Okay for Protocol, baseline anticoagulation labs pending. Goal of Therapy:  Systemic Anticoagulation. Anti-Xa level 0.6-1 units/ml 4hrs after LMWH dose given if clinically indicated. Monitor platelets by anticoagulation protocol: Yes   Plan:  Lovenox 1mg /kg SQ every 12 hours. Monitor for signs and symptoms of bleeding.    Pricilla Larsson 06/08/2016,10:44 PM

## 2016-06-08 NOTE — ED Triage Notes (Signed)
Pt sent by Dr Willey Blade. Pt with SOB with exertion and cough for 2 weeks. Per pt was given abt by Dr Luan Pulling last week due to coughing up blood and mucous. No longer coughing up blood

## 2016-06-08 NOTE — ED Provider Notes (Signed)
[facility] DEPT Provider Note   CSN: [id number] Arrival date & time: 06/08/16  1717     History   Chief Complaint Chief Complaint  Patient presents with  . Shortness of Breath    HPI Jeremy Patton is a 76 y.o. male.  HPI  This is a 76 year old male with a past medical history of diabetes, hypertension, hyperlipidemia and carotid artery occlusion presents to the emergency department today with 2 weeks of progressively worsening dyspnea worse with exertion. No associated chest pain or fever. States he does have a cough at times with the shortness of breath. He does get dyspneic when he lays flat and has trouble sleeping at night that is new for him as well. States this came on relatively acutely about 2 weeks ago usually walks 45 minutes a day and within the timeframe of 1-2 days he was no longer able to walk without getting short of breath. No history of blood clots or lower extremity swelling or pain. No recent travels or surgeries. No known heart attacks. Does have a family history of congestive heart failure. No recent illnesses.  Past Medical History:  Diagnosis Date  . Carotid artery occlusion    Bruit, Right  . Diabetes mellitus without complication ([place]) 77/10/3901  . GERD (gastroesophageal reflux disease)   . Hyperlipidemia 11/29/2011  . Hypertension 11/29/2011  . Low back pain 11/29/2011  . Lumbar radiculopathy 11/29/2011   Left  . Pain of left lower leg   . Wears glasses     Patient Active Problem List   Diagnosis Date Noted  . Left carotid artery stenosis 06/25/2015  . Carotid stenosis 05/06/2015  . Preoperative clearance 05/05/2015  . Carotid artery disease ([place]) 05/01/2015  . Dizziness 05/01/2015  . Essential hypertension 05/01/2015  . Hyperlipidemia LDL goal <70 05/01/2015  . Type 2 diabetes mellitus with circulatory disorder, with long-term current use of insulin ([place]) 05/01/2015  . History of colonic polyps   . Diverticulosis of colon without  hemorrhage   . Reflux esophagitis   . Mucosal abnormality of stomach   . GERD (gastroesophageal reflux disease) 11/01/2014  . Esophageal dysphagia 11/01/2014  . FH: colon cancer in first degree relative <28 years old 11/01/2014  . Carotid stenosis, bilateral 12/17/2011    Past Surgical History:  Procedure Laterality Date  . CATARACT EXTRACTION W/ INTRAOCULAR LENS  IMPLANT, BILATERAL    . CIRCUMCISION     Age 25  . COLONOSCOPY  08/22/2005 and  07/31/2009   minimal internal hemorrhoids, left-sided diverticulosis  . COLONOSCOPY N/A 11/25/2014   Procedure: COLONOSCOPY;  Surgeon: Daneil Dolin, MD;  [facility];  Service: Endoscopy;  Laterality: N/A;  1030  . ENDARTERECTOMY Right 05/06/2015   Procedure: Right Carotid ENDARTERECTOMY ;  Surgeon: Conrad [employer], MD;  Location: [place];  Service: Vascular;  Laterality: Right;  . ENDARTERECTOMY Left 06/25/2015   Procedure: LEFT CAROTID ENDARTERECTOMY;  Surgeon: Conrad [place], MD;  Location: [place];  Service: Vascular;  Laterality: Left;  . ESOPHAGEAL DILATION N/A 11/25/2014   Procedure: ESOPHAGEAL DILATION;  Surgeon: Daneil Dolin, MD;  [facility];  Service: Endoscopy;  Laterality: N/A;  . ESOPHAGOGASTRODUODENOSCOPY N/A 11/25/2014   Procedure: ESOPHAGOGASTRODUODENOSCOPY (EGD);  Surgeon: Daneil Dolin, MD;  [facility];  Service: Endoscopy;  Laterality: N/A;  . EYE SURGERY    . MULTIPLE TOOTH EXTRACTIONS    . PATCH ANGIOPLASTY Right 05/06/2015   Procedure: PATCH ANGIOPLASTY;  Surgeon: Conrad [place], MD;  [facility];  Service: Vascular;  Laterality: Right;  . PATCH ANGIOPLASTY Left 06/25/2015   Procedure: PATCH ANGIOPLASTY USING XENOSURE BIOLOGIC PATCH 1cm X 6cm ;  Surgeon: Conrad [place], MD;  Location: [place];  Service: Vascular;  Laterality: Left;  . TONSILLECTOMY         Home Medications    Prior to Admission medications   Medication Sig Start Date End Date Taking? Authorizing Provider  acetaminophen (TYLENOL)  500 MG tablet Take 1,000 mg by mouth every 6 (six) hours as needed for moderate pain.    Historical Provider, MD  aspirin 81 MG tablet Take 81 mg by mouth daily.    Historical Provider, MD  chlorproMAZINE (THORAZINE) 25 MG tablet Take 25 mg by mouth 4 (four) times daily as needed for nausea. Reported on 05/22/2015    Historical Provider, MD  gabapentin (NEURONTIN) 300 MG capsule Take 300 mg by mouth 2 (two) times daily.  11/24/11   Historical Provider, MD  LANTUS SOLOSTAR 100 UNIT/ML Solostar Pen Inject 30 Units into the skin every morning.  05/15/15   Historical Provider, MD  lisinopril-hydrochlorothiazide (PRINZIDE,ZESTORETIC) 20-12.5 MG per tablet Take 2 tablets by mouth daily.  11/24/11   Historical Provider, MD  metFORMIN (GLUCOPHAGE) 1000 MG tablet Take 1,000 mg by mouth 2 (two) times daily with a meal.  11/24/11   Historical Provider, MD  pantoprazole (PROTONIX) 40 MG tablet Take 40 mg by mouth daily.  04/18/15   Historical Provider, MD  pioglitazone (ACTOS) 15 MG tablet Take 15 mg by mouth daily.  04/18/15   Historical Provider, MD  rosuvastatin (CRESTOR) 20 MG tablet Take 1 tablet (20 mg total) by mouth daily. <PLEASE MAKE APPOINTMENT FOR REFILLS> 04/01/16   Troy Sine, MD  Tamsulosin HCl (FLOMAX) 0.4 MG CAPS Take 0.4 mg by mouth daily.  11/24/11   Historical Provider, MD  verapamil (CALAN-SR) 180 MG CR tablet Take 180 mg by mouth 2 (two) times daily.    Historical Provider, MD    Family History Family History  Problem Relation Age of Onset  . Stroke Mother   . Heart disease Mother     before age 49  . Colon cancer Brother     Age 23    Social History Social History  Substance Use Topics  . Smoking status: Former Smoker    Packs/day: 2.00    Years: 30.00    Types: Cigarettes    Quit date: 12/17/1991  . Smokeless tobacco: Never Used  . Alcohol use No     Allergies   Protamine   Review of Systems Review of Systems  All other systems reviewed and are  negative.    Physical Exam Updated Vital Signs BP 130/67 (BP Location: Right Arm)   Pulse 87   Temp 97.6 F (36.4 C) (Oral)   Resp (!) 21   Ht 5\' 11"  (1.803 m)   Wt 197 lb (89.4 kg)   SpO2 (!) 86%   BMI 27.48 kg/m   Physical Exam  Constitutional: He is oriented to person, place, and time. He appears well-developed and well-nourished.  HENT:  Head: Normocephalic and atraumatic.  Eyes: Conjunctivae and EOM are normal.  Neck: Normal range of motion.  Cardiovascular: Normal rate.   Pulmonary/Chest: Effort normal. Tachypnea noted. No respiratory distress. He has no decreased breath sounds. He has rales.  Abdominal: Soft. He exhibits distension.  Musculoskeletal: Normal range of motion. He exhibits no edema or deformity.  Neurological: He is alert and oriented  to person, place, and time. No cranial nerve deficit. Coordination normal.  Skin: Skin is warm and dry.  Nursing note and vitals reviewed.    ED Treatments / Results  Labs (all labs ordered are listed, but only abnormal results are displayed) Labs Reviewed  BRAIN NATRIURETIC PEPTIDE  TROPONIN I  CBC WITH DIFFERENTIAL/PLATELET  COMPREHENSIVE METABOLIC PANEL    EKG  EKG Interpretation None       Radiology No results found.  Procedures Procedures (including critical care time)  Medications Ordered in ED Medications  albuterol (PROVENTIL) (2.5 MG/3ML) 0.083% nebulizer solution 5 mg (not administered)     Initial Impression / Assessment and Plan / ED Course  I have reviewed the triage vital signs and the nursing notes.  Pertinent labs & imaging results that were available during my care of the patient were reviewed by me and considered in my medical decision making (see chart for details).      Likely CHF. Less likely PE. Will eval for CHF first, if negative, consider d dimer for PE.  Workup negative aside from likely acute CHF exacerbation, Lasix given - 40 mg since lasix naive.  XR showed possible  infiltrate but without fever or leukocytosis, low suspicion for PNA (with another more obvious cause for symptoms) so will hold on abx for time being.  Plan for admission for further management.   Final Clinical Impressions(s) / ED Diagnoses   Final diagnoses:  Acute congestive heart failure, unspecified heart failure type [facility])  Hypoxia     Merrily Pew, MD 06/09/16 1312

## 2016-06-08 NOTE — ED Notes (Signed)
O2 at 2 L/min via [place] initiated sats 88 % on room air. O2 sat up to 93 %

## 2016-06-09 ENCOUNTER — Inpatient Hospital Stay (HOSPITAL_COMMUNITY): Payer: Medicare Other

## 2016-06-09 DIAGNOSIS — I509 Heart failure, unspecified: Secondary | ICD-10-CM

## 2016-06-09 LAB — ECHOCARDIOGRAM COMPLETE
E decel time: 151 ms
E/e' ratio: 16.69
FS: 11 % — AB (ref 28–44)
Height: 71 in
IVS/LV PW RATIO, ED: 1.21
LA ID, A-P, ES: 48 mm
LA diam end sys: 48 mm
LA diam index: 2.29 cm/m2
LA vol A4C: 69.3 mL
LA vol index: 31.3 mL/m2
LA vol: 65.6 mL
LV E/e' medial: 16.69
LV E/e'average: 16.69
LV PW d: 10.6 mm — AB (ref 0.6–1.1)
LV dias vol index: 45 mL/m2
LV dias vol: 94 mL (ref 62–150)
LV e' LATERAL: 7.07 cm/s
LV sys vol index: 25 mL/m2
LV sys vol: 53 mL
LVOT SV: 47 mL
LVOT VTI: 16.6 cm
LVOT area: 2.84 cm2
LVOT diameter: 19 mm
LVOT peak grad rest: 3 mmHg
LVOT peak vel: 82.4 cm/s
Lateral S' vel: 11.4 cm/s
MV Dec: 151
MV Peak grad: 6 mmHg
MV pk A vel: 60.6 m/s
MV pk E vel: 118 m/s
PISA EROA: 0.17 cm2
RV sys press: 18 mmHg
Reg peak vel: 195 cm/s
Simpson's disk: 44
Stroke v: 41 mL
TAPSE: 18.7 mm
TDI e' lateral: 7.07
TDI e' medial: 7.07
TR max vel: 195 cm/s
VTI: 128 cm
Weight: 3062.4 [oz_av]

## 2016-06-09 LAB — TROPONIN I
TROPONIN I: 0.11 ng/mL — AB (ref <0.03)
Troponin I: 0.11 ng/mL

## 2016-06-09 LAB — CBC
HCT: 32.1 % — ABNORMAL LOW (ref 39.0–52.0)
Hemoglobin: 10.8 g/dL — ABNORMAL LOW (ref 13.0–17.0)
MCH: 29.1 pg (ref 26.0–34.0)
MCHC: 33.6 g/dL (ref 30.0–36.0)
MCV: 86.5 fL (ref 78.0–100.0)
Platelets: 270 10*3/uL (ref 150–400)
RBC: 3.71 MIL/uL — ABNORMAL LOW (ref 4.22–5.81)
RDW: 14.9 % (ref 11.5–15.5)
WBC: 7.2 10*3/uL (ref 4.0–10.5)

## 2016-06-09 LAB — BASIC METABOLIC PANEL WITH GFR
Anion gap: 9 (ref 5–15)
BUN: 20 mg/dL (ref 6–20)
CO2: 26 mmol/L (ref 22–32)
Calcium: 8.5 mg/dL — ABNORMAL LOW (ref 8.9–10.3)
Chloride: 104 mmol/L (ref 101–111)
Creatinine, Ser: 1.04 mg/dL (ref 0.61–1.24)
GFR calc Af Amer: >60 mL/min
GFR calc non Af Amer: >60 mL/min
Glucose, Bld: 113 mg/dL — ABNORMAL HIGH (ref 65–99)
Potassium: 3.7 mmol/L (ref 3.5–5.1)
Sodium: 139 mmol/L (ref 135–145)

## 2016-06-09 LAB — INFLUENZA PANEL BY PCR (TYPE A & B)
INFLBPCR: NEGATIVE
Influenza A By PCR: NEGATIVE

## 2016-06-09 LAB — GLUCOSE, CAPILLARY
GLUCOSE-CAPILLARY: 92 mg/dL (ref 65–99)
GLUCOSE-CAPILLARY: 136 mg/dL — AB (ref 65–99)
Glucose-Capillary: 101 mg/dL — ABNORMAL HIGH (ref 65–99)
Glucose-Capillary: 266 mg/dL — ABNORMAL HIGH (ref 65–99)

## 2016-06-09 LAB — APTT: aPTT: 50 s — ABNORMAL HIGH (ref 24–36)

## 2016-06-09 MED ORDER — LEVOFLOXACIN 750 MG PO TABS
750.0000 mg | ORAL_TABLET | Freq: Every day | ORAL | Status: DC
  Administered 2016-06-09 – 2016-06-10 (×2): 750 mg via ORAL
  Filled 2016-06-09 (×2): qty 1

## 2016-06-09 MED ORDER — ORAL CARE MOUTH RINSE
15.0000 mL | Freq: Two times a day (BID) | OROMUCOSAL | Status: DC
  Administered 2016-06-09 (×2): 15 mL via OROMUCOSAL

## 2016-06-09 MED ORDER — ROSUVASTATIN CALCIUM 20 MG PO TABS
20.0000 mg | ORAL_TABLET | Freq: Every day | ORAL | Status: DC
  Administered 2016-06-09: 20 mg via ORAL
  Filled 2016-06-09: qty 1

## 2016-06-09 NOTE — Care Management Note (Signed)
Case Management Note  Patient Details  Name: Jeremy Patton MRN: [id number] Date of Birth: 09-22-1940  Subjective/Objective:                  ADmitted with CHF exacerbation. Pt is from home, lives with his wife. He is ind with ADL's. He uses no DME. He has PCP, drives himself to appointments. He has scale and weighs himself daily. He has insurance with drug coverage and has no difficulty managing medications. He is complaint with medications and diet. Pt is currently on supplemental oxygen at this time but does not have oxygen at home pta.   Action/Plan: Pt plans to return home with self care. Will follow for DC planning needs.   Expected Discharge Date:     06/11/2016             Expected Discharge Plan:  Home/Self Care  In-House Referral:  NA  Discharge planning Services  CM Consult  Post Acute Care Choice:  NA Choice offered to:  NA  Status of Service:  In process, will continue to follow  Sherald Barge, RN 06/09/2016, 1:45 PM

## 2016-06-09 NOTE — Progress Notes (Signed)
*  PRELIMINARY RESULTS* Echocardiogram 2D Echocardiogram has been performed.  Jeremy Patton 06/09/2016, 2:51 PM

## 2016-06-09 NOTE — Consult Note (Addendum)
Cardiology Consultation   Patient ID: Jeremy Patton; [id number]; 1940-10-28   Admit date: 06/08/2016 Date of Consult: 06/09/2016  Referring MD:  Dr. Willey Blade Cardiologist: Dr. Claiborne Billings Consulting Cardiologist: Dr. Harl Bowie  Patient Care Team: Asencion Noble, MD as PCP - General (Internal Medicine)    Reason for Consultation: CHF   History of Present Illness: Jeremy Patton is a 76 y.o. male with a hx of hypertension, DM, GERD and carotid disease who was seen by Dr. Ellouise Newer 04/2015 for cardiac clearance before undergoing carotid surgery. Lexi scan LVEF 56% with a large defect of moderate severity in the inferior to inferolateral wall without associated ischemia. Gated images showed normal wall motion and was felt most likely due to extensive diaphragmatic attenuation in light of normal wall motion but an MI could not be excluded. There was no ischemia. 2-D echo confirmed normal systolic function without regional wall motion abnormalities. He had normal diastolic parameters mild left atrial dilatation. There was mild aortic sclerosis without stenosis.  Patient was seen in Dr. Ria Comment office yesterday with shortness of breath and hypoxia with oxygen sats in the 80s. He was admitted with possible pneumonia and CHF. EKG with some inferior lateral ST T wave flattening and inversion from last year. Troponins flat 0.12, 0.11  Patient states he was walking 45 min daily but 3-4 weeks ago had to stop because of significant dyspnea on exertion. He then developed it with walking to his mailbox. Last week he had orthopnea and had to sleep upright. Denies any chest pain or pressure, edema. Wife diagnosed with flu Monday. His mother had CABG 31's. Remote smoker 25 pk yrs but quit over 25 yrs ago, DM, HTN , HLD.  Past Medical History:  Diagnosis Date  . Carotid artery occlusion    Bruit, Right  . Diabetes mellitus without complication ([place]) 85/03/7780  . GERD (gastroesophageal reflux disease)   .  Hyperlipidemia 11/29/2011  . Hypertension 11/29/2011  . Low back pain 11/29/2011  . Lumbar radiculopathy 11/29/2011   Left  . Pain of left lower leg   . Wears glasses     Past Surgical History:  Procedure Laterality Date  . CATARACT EXTRACTION W/ INTRAOCULAR LENS  IMPLANT, BILATERAL    . CIRCUMCISION     Age 62  . COLONOSCOPY  08/22/2005 and  07/31/2009   minimal internal hemorrhoids, left-sided diverticulosis  . COLONOSCOPY N/A 11/25/2014   Procedure: COLONOSCOPY;  Surgeon: Daneil Dolin, MD;  [facility];  Service: Endoscopy;  Laterality: N/A;  1030  . ENDARTERECTOMY Right 05/06/2015   Procedure: Right Carotid ENDARTERECTOMY ;  Surgeon: Conrad [place], MD;  Location: [place];  Service: Vascular;  Laterality: Right;  . ENDARTERECTOMY Left 06/25/2015   Procedure: LEFT CAROTID ENDARTERECTOMY;  Surgeon: Conrad [place], MD;  Location: [place];  Service: Vascular;  Laterality: Left;  . ESOPHAGEAL DILATION N/A 11/25/2014   Procedure: ESOPHAGEAL DILATION;  Surgeon: Daneil Dolin, MD;  [facility];  Service: Endoscopy;  Laterality: N/A;  . ESOPHAGOGASTRODUODENOSCOPY N/A 11/25/2014   Procedure: ESOPHAGOGASTRODUODENOSCOPY (EGD);  Surgeon: Daneil Dolin, MD;  [facility];  Service: Endoscopy;  Laterality: N/A;  . EYE SURGERY    . MULTIPLE TOOTH EXTRACTIONS    . PATCH ANGIOPLASTY Right 05/06/2015   Procedure: PATCH ANGIOPLASTY;  Surgeon: Conrad , MD;  Location: [place];  Service: Vascular;  Laterality: Right;  . PATCH ANGIOPLASTY Left 06/25/2015   Procedure: PATCH ANGIOPLASTY USING XENOSURE BIOLOGIC PATCH 1cm  X 6cm ;  Surgeon: Conrad [place], MD;  Location: [place];  Service: Vascular;  Laterality: Left;  . TONSILLECTOMY        Home Meds: Prior to Admission medications   Medication Sig Start Date End Date Taking? Authorizing Provider  acetaminophen (TYLENOL) 500 MG tablet Take 1,000 mg by mouth every morning.    Yes Historical Provider, MD  aspirin 81 MG tablet Take 81 mg  by mouth daily.   Yes Historical Provider, MD  chlorproMAZINE (THORAZINE) 25 MG tablet Take 25 mg by mouth 4 (four) times daily as needed for nausea. Reported on 05/22/2015   Yes Historical Provider, MD  gabapentin (NEURONTIN) 300 MG capsule Take 300 mg by mouth 2 (two) times daily.  11/24/11  Yes Historical Provider, MD  insulin aspart (NOVOLOG FLEXPEN) 100 UNIT/ML FlexPen Inject 3 Units into the skin every morning.   Yes Historical Provider, MD  LANTUS SOLOSTAR 100 UNIT/ML Solostar Pen Inject 30 Units into the skin every morning.  05/15/15  Yes Historical Provider, MD  lisinopril-hydrochlorothiazide (PRINZIDE,ZESTORETIC) 20-12.5 MG per tablet Take 2 tablets by mouth daily.  11/24/11  Yes Historical Provider, MD  metFORMIN (GLUCOPHAGE) 1000 MG tablet Take 1,000 mg by mouth 2 (two) times daily with a meal.  11/24/11  Yes Historical Provider, MD  pantoprazole (PROTONIX) 40 MG tablet Take 40 mg by mouth daily.  04/18/15  Yes Historical Provider, MD  pioglitazone (ACTOS) 15 MG tablet Take 15 mg by mouth daily.  04/18/15  Yes Historical Provider, MD  rosuvastatin (CRESTOR) 20 MG tablet Take 1 tablet (20 mg total) by mouth daily. <PLEASE MAKE APPOINTMENT FOR REFILLS> Patient taking differently: Take 20 mg by mouth daily.  04/01/16  Yes Troy Sine, MD  Tamsulosin HCl (FLOMAX) 0.4 MG CAPS Take 0.4 mg by mouth daily.  11/24/11  Yes Historical Provider, MD  verapamil (CALAN-SR) 180 MG CR tablet Take 180 mg by mouth 2 (two) times daily.   Yes Historical Provider, MD    Current Medications: . acetaminophen  1,000 mg Oral q morning - 10a  . aspirin  81 mg Oral Daily  . enoxaparin (LOVENOX) injection  1 mg/kg Subcutaneous Q12H  . furosemide  40 mg Intravenous Daily  . gabapentin  300 mg Oral BID  . insulin aspart  0-9 Units Subcutaneous TID WC  . insulin aspart  3 Units Subcutaneous QAC breakfast  . insulin glargine  30 Units Subcutaneous Daily  . levofloxacin  750 mg Oral Daily  . lisinopril  40 mg Oral Daily   . mouth rinse  15 mL Mouth Rinse BID  . pantoprazole  40 mg Oral Daily  . sodium chloride flush  3 mL Intravenous Q12H  . tamsulosin  0.4 mg Oral Daily  . verapamil  180 mg Oral BID     Allergies:    Allergies  Allergen Reactions  . Protamine     hypotension    Social History:   The patient  reports that he quit smoking about 24 years ago. His smoking use included Cigarettes. He has a 60.00 pack-year smoking history. He has never used smokeless tobacco. He reports that he does not drink alcohol or use drugs.    Family History:   The patient's family history includes Colon cancer in his brother; Heart disease in his mother; Stroke in his mother.   ROS:  Please see the history of present illness.  Review of Systems  Constitution: Positive for chills and weakness.  HENT: Negative.  Cardiovascular: Positive for dyspnea on exertion.  Respiratory: Positive for cough, shortness of breath and sleep disturbances due to breathing.   Endocrine: Negative.   Hematologic/Lymphatic: Negative.   Musculoskeletal: Negative.   Gastrointestinal: Negative.   Genitourinary: Negative.    All other ROS reviewed and negative.      Vital Signs: Blood pressure (!) 111/59, pulse 80, temperature 98.7 F (37.1 C), temperature source Oral, resp. rate 16, height 5\' 11"  (1.803 m), weight 191 lb 6.4 oz (86.8 kg), SpO2 96 %.   PHYSICAL EXAM: General:  Well nourished, well developed, in no acute distress  HEENT: normal Lymph: no adenopathy Neck: Increased JVD Endocrine:  No thryomegaly Vascular:left carotid bruits; FA pulses 2+ bilaterally without bruits  Cardiac:  RRR; normal S1, S2; 2/6 systolic murmur LSB, no rub, bruit, thrill, or heave Lungs:  Bilateral rales  Abd: soft, nontender, no hepatomegaly  Ext: no edema, Good distal pulses bilaterally Musculoskeletal:  No deformities, BUE and BLE strength normal and equal Skin: warm and dry  Neuro:  CNs 2-12 intact, no focal abnormalities noted Psych:   Normal affect    EKG:  Normal sinus rhythm with some inferior lateral ST T wave flattening and inversion from last year-personally reviewed   Telemetry: NSR-personally reviewed  Labs:  Recent Labs  06/08/16 1737 06/08/16 2229 06/09/16 0430  TROPONINI 0.12* 0.12* 0.11*   Lab Results  Component Value Date   WBC 7.2 06/09/2016   HGB 10.8 (L) 06/09/2016   HCT 32.1 (L) 06/09/2016   MCV 86.5 06/09/2016   PLT 270 06/09/2016    Recent Labs Lab 06/08/16 1737 06/09/16 0430  NA 141 139  K 3.7 3.7  CL 107 104  CO2 25 26  BUN 16 20  CREATININE 1.09 1.04  CALCIUM 9.0 8.5*  PROT 6.9  --   BILITOT 0.3  --   ALKPHOS 62  --   ALT 18  --   AST 18  --   GLUCOSE 121* 113*   Lab Results  Component Value Date   CHOL 163 04/30/2015   HDL 63 04/30/2015   LDLCALC 89 04/30/2015   TRIG 55 04/30/2015   No results found for: DDIMER  Radiology/Studies:  Dg Chest 2 View  Result Date: 06/08/2016 CLINICAL DATA:  Subacute onset of shortness of breath on exertion and cough. Initial encounter. EXAM: CHEST  2 VIEW COMPARISON:  Chest radiograph performed 11/03/2015 FINDINGS: The lungs are well-aerated. Vascular congestion is noted. Patchy right midlung airspace opacity raises concern for pneumonia. Increased interstitial markings raise question for superimposed interstitial edema. Small bilateral pleural effusions are suggested. There is no evidence of pneumothorax. The heart is normal in size; the mediastinal contour is within normal limits. No acute osseous abnormalities are seen. IMPRESSION: Vascular congestion. Patchy right midlung airspace opacity raises concern for pneumonia. Increased interstitial markings raise question for superimposed interstitial edema. Small bilateral pleural effusions suggested. Electronically Signed   By: Garald Balding M.D.   On: 06/08/2016 18:54  Lexi scan 05/01/15  The left ventricular ejection fraction is normal (55-65%).  Nuclear stress EF: 56%.  There was  no ST segment deviation noted during stress.  No T wave inversion was noted during stress.  Defect 1: There is a large defect of moderate severity present in the basal inferior, basal inferolateral, mid inferior, mid inferolateral, apical inferior and apical lateral location. This is likely diaphragmatic attenuation given the normal wall motion. However, cannot rule out prior infarct. There is no ischemia.  This is a  low risk study.   Study Conclusions  2-D echo 05/01/15 - Left ventricle: The cavity size was normal. Systolic function was   normal. The estimated ejection fraction was in the range of 55%   to 60%. Wall motion was normal; there were no regional wall   motion abnormalities. Left ventricular diastolic function   parameters were normal. - Left atrium: The atrium was mildly dilated.     PROBLEM LIST:  Principal Problem:   CHF exacerbation ([place]) Active Problems:   GERD (gastroesophageal reflux disease)   Carotid artery disease ([place])   Essential hypertension   Type 2 diabetes mellitus with circulatory disorder, with long-term current use of insulin ([facility])   Acute respiratory failure with hypoxia ([facility])     ASSESSMENT AND PLAN:  CHF 3-4 week hx of worsening dyspnea on exertion, no chest pain.  Could be anginal equivalent and may need cath. Normal LVEF on echo 04/2015. Large defect on Lexiscan felt secondary to diaphragmatic attenuation.Await echo. Repeat EKG On Lasix 40 mg IV  HTN on lisinopril 40 mg daily, verapamil 180 mg BID(should stop with CHF)-could change to beta blocker.  DM  HLD on crestor at home.  Family history of CAD-mother CABG in 60's  Remote smoker  Wife diagnosed with flu Monday- will test.  RCEA 04/2015   Signed, Ermalinda Barrios, PA-C  06/09/2016 8:14 AM   Patient seen and discussed with PA Bonnell Public, I agree with her documentation. 76 yo male history of PAD, HTN, DM2, GERD admitted with DOE   04/2015 Nuclear stress: large moderate inferior defect,  likely diagphragmatic attenuation given normal wall motion. No ischemia. LVEF 56% 04/2015 echo: LVEF 55-60%, no WMAs, normal diastolic function CXR pulm edema, patch right midlung airspace opacity ? pneumonia. Small bilateral effusions TSH normal, BNP 433, WBC 10.1 Hgb 12.1 plt 318 K 3.7 Cr 1.09 Trop 0.12-->0.12-->0.11 EKG SR, lateral ST/T changes   Patient admitted with SOB and weight gain, subjective abdominal swelling. . Mild fairly flat troponin elevation, mild lateral precordial ST/T changes.Mildly elevated BNP.  CXR suggests possible pneumonia, he has been started on levaquin. He does have signs of clinical CHF as well. Unclear if demand ischemia in setting of respiratory infection/CHF or true primary cardiac ischemia. We will f/u echo results. He has multiple CAD risk factors and known PAD. Consider repeat ischemic testing tomorrow, either invasive vs noninvasive based on further workup.Negative 525mL this admit, continue IV lasix. Medical therapy with ASA, full dose lovenox, lisinopril 40, cresto 20. May need to change verapamil to beta blocker pending LVEF and CAD workup.   Carlyle Dolly MD

## 2016-06-09 NOTE — Progress Notes (Signed)
Subjective: This patient was initially seen at my office yesterday and was sent to the emergency room for further evaluation of shortness of breath and hypoxia. His chest x-ray revealed what appears to be CHF and a possible pneumonia on the right. He had a mildly elevated troponin level also and has been treated with full dose Lovenox. He has been started on intravenous Lasix and is feeling better this morning. Oxygen saturations are in the 90s. This started about a week ago with shortness of breath, dyspnea on exertion and cough. He experienced hemoptysis which has resolved.  Objective: Vital signs in last 24 hours: Vitals:   06/08/16 2044 06/08/16 2200 06/09/16 0625 06/09/16 0633  BP: (!) 108/57 (!) 119/55 (!) 111/59   Pulse: 86 80 80   Resp: 20 20 20 16   Temp:  98.6 F (37 C) 98.7 F (37.1 C)   TempSrc:  Oral Oral   SpO2: 96% 94% 91% 96%  Weight:  195 lb (88.5 kg) 191 lb 6.4 oz (86.8 kg)   Height:  5\' 11"  (1.803 m)     Weight change:   Intake/Output Summary (Last 24 hours) at 06/09/16 7619 Last data filed at 06/08/16 2314  Gross per 24 hour  Intake                3 ml  Output              350 ml  Net             -347 ml    Physical Exam: Alert. No distress. Lungs reveal basilar rales greater on the right than left. Heart regular with a grade 1 systolic murmur. Extremities reveal no edema. No calf tenderness or swelling.  Lab Results:    Results for orders placed or performed during the hospital encounter of 06/08/16 (from the past 24 hour(s))  TSH     Status: None   Collection Time: 06/08/16  5:25 PM  Result Value Ref Range   TSH 0.857 0.350 - 4.500 uIU/mL  Protime-INR     Status: None   Collection Time: 06/08/16  5:25 PM  Result Value Ref Range   Prothrombin Time 13.0 11.4 - 15.2 seconds   INR 0.98   Brain natriuretic peptide     Status: Abnormal   Collection Time: 06/08/16  5:37 PM  Result Value Ref Range   B Natriuretic Peptide 433.0 (H) 0.0 - 100.0 pg/mL   Troponin I     Status: Abnormal   Collection Time: 06/08/16  5:37 PM  Result Value Ref Range   Troponin I 0.12 (HH) <0.03 ng/mL  CBC with Differential     Status: Abnormal   Collection Time: 06/08/16  5:37 PM  Result Value Ref Range   WBC 10.1 4.0 - 10.5 K/uL   RBC 4.16 (L) 4.22 - 5.81 MIL/uL   Hemoglobin 12.1 (L) 13.0 - 17.0 g/dL   HCT 36.2 (L) 39.0 - 52.0 %   MCV 87.0 78.0 - 100.0 fL   MCH 29.1 26.0 - 34.0 pg   MCHC 33.4 30.0 - 36.0 g/dL   RDW 14.9 11.5 - 15.5 %   Platelets 318 150 - 400 K/uL   Neutrophils Relative % 68 %   Neutro Abs 6.8 1.7 - 7.7 K/uL   Lymphocytes Relative 20 %   Lymphs Abs 2.0 0.7 - 4.0 K/uL   Monocytes Relative 10 %   Monocytes Absolute 1.0 0.1 - 1.0 K/uL   Eosinophils Relative 2 %  Eosinophils Absolute 0.2 0.0 - 0.7 K/uL   Basophils Relative 0 %   Basophils Absolute 0.0 0.0 - 0.1 K/uL  Comprehensive metabolic panel     Status: Abnormal   Collection Time: 06/08/16  5:37 PM  Result Value Ref Range   Sodium 141 135 - 145 mmol/L   Potassium 3.7 3.5 - 5.1 mmol/L   Chloride 107 101 - 111 mmol/L   CO2 25 22 - 32 mmol/L   Glucose, Bld 121 (H) 65 - 99 mg/dL   BUN 16 6 - 20 mg/dL   Creatinine, Ser 1.09 0.61 - 1.24 mg/dL   Calcium 9.0 8.9 - 10.3 mg/dL   Total Protein 6.9 6.5 - 8.1 g/dL   Albumin 3.2 (L) 3.5 - 5.0 g/dL   AST 18 15 - 41 U/L   ALT 18 17 - 63 U/L   Alkaline Phosphatase 62 38 - 126 U/L   Total Bilirubin 0.3 0.3 - 1.2 mg/dL   GFR calc non Af Amer >60 >60 mL/min   GFR calc Af Amer >60 >60 mL/min   Anion gap 9 5 - 15  CBG monitoring, ED     Status: None   Collection Time: 06/08/16  7:49 PM  Result Value Ref Range   Glucose-Capillary 80 65 - 99 mg/dL   Comment 1 Notify RN    Comment 2 Document in Chart   Troponin I     Status: Abnormal   Collection Time: 06/08/16 10:29 PM  Result Value Ref Range   Troponin I 0.12 (HH) <0.03 ng/mL  Glucose, capillary     Status: Abnormal   Collection Time: 06/08/16 11:04 PM  Result Value Ref Range    Glucose-Capillary 144 (H) 65 - 99 mg/dL   Comment 1 Notify RN    Comment 2 Document in Chart   Basic metabolic panel     Status: Abnormal   Collection Time: 06/09/16  4:30 AM  Result Value Ref Range   Sodium 139 135 - 145 mmol/L   Potassium 3.7 3.5 - 5.1 mmol/L   Chloride 104 101 - 111 mmol/L   CO2 26 22 - 32 mmol/L   Glucose, Bld 113 (H) 65 - 99 mg/dL   BUN 20 6 - 20 mg/dL   Creatinine, Ser 1.04 0.61 - 1.24 mg/dL   Calcium 8.5 (L) 8.9 - 10.3 mg/dL   GFR calc non Af Amer >60 >60 mL/min   GFR calc Af Amer >60 >60 mL/min   Anion gap 9 5 - 15  Troponin I     Status: Abnormal   Collection Time: 06/09/16  4:30 AM  Result Value Ref Range   Troponin I 0.11 (HH) <0.03 ng/mL  CBC     Status: Abnormal   Collection Time: 06/09/16  4:30 AM  Result Value Ref Range   WBC 7.2 4.0 - 10.5 K/uL   RBC 3.71 (L) 4.22 - 5.81 MIL/uL   Hemoglobin 10.8 (L) 13.0 - 17.0 g/dL   HCT 32.1 (L) 39.0 - 52.0 %   MCV 86.5 78.0 - 100.0 fL   MCH 29.1 26.0 - 34.0 pg   MCHC 33.6 30.0 - 36.0 g/dL   RDW 14.9 11.5 - 15.5 %   Platelets 270 150 - 400 K/uL  APTT     Status: Abnormal   Collection Time: 06/09/16  4:30 AM  Result Value Ref Range   aPTT 50 (H) 24 - 36 seconds     ABGS No results for input(s): PHART, PO2ART, TCO2, HCO3 in  the last 72 hours.  Invalid input(s): PCO2 CULTURES No results found for this or any previous visit (from the past 240 hour(s)). Studies/Results: Dg Chest 2 View  Result Date: 06/08/2016 CLINICAL DATA:  Subacute onset of shortness of breath on exertion and cough. Initial encounter. EXAM: CHEST  2 VIEW COMPARISON:  Chest radiograph performed 11/03/2015 FINDINGS: The lungs are well-aerated. Vascular congestion is noted. Patchy right midlung airspace opacity raises concern for pneumonia. Increased interstitial markings raise question for superimposed interstitial edema. Small bilateral pleural effusions are suggested. There is no evidence of pneumothorax. The heart is normal in size;  the mediastinal contour is within normal limits. No acute osseous abnormalities are seen. IMPRESSION: Vascular congestion. Patchy right midlung airspace opacity raises concern for pneumonia. Increased interstitial markings raise question for superimposed interstitial edema. Small bilateral pleural effusions suggested. Electronically Signed   By: Garald Balding M.D.   On: 06/08/2016 18:54   Micro Results: No results found for this or any previous visit (from the past 240 hour(s)). Studies/Results: Dg Chest 2 View  Result Date: 06/08/2016 CLINICAL DATA:  Subacute onset of shortness of breath on exertion and cough. Initial encounter. EXAM: CHEST  2 VIEW COMPARISON:  Chest radiograph performed 11/03/2015 FINDINGS: The lungs are well-aerated. Vascular congestion is noted. Patchy right midlung airspace opacity raises concern for pneumonia. Increased interstitial markings raise question for superimposed interstitial edema. Small bilateral pleural effusions are suggested. There is no evidence of pneumothorax. The heart is normal in size; the mediastinal contour is within normal limits. No acute osseous abnormalities are seen. IMPRESSION: Vascular congestion. Patchy right midlung airspace opacity raises concern for pneumonia. Increased interstitial markings raise question for superimposed interstitial edema. Small bilateral pleural effusions suggested. Electronically Signed   By: Garald Balding M.D.   On: 06/08/2016 18:54   Medications:  I have reviewed the patient's current medications Scheduled Meds: . acetaminophen  1,000 mg Oral q morning - 10a  . aspirin  81 mg Oral Daily  . enoxaparin (LOVENOX) injection  1 mg/kg Subcutaneous Q12H  . furosemide  40 mg Intravenous Daily  . gabapentin  300 mg Oral BID  . insulin aspart  0-9 Units Subcutaneous TID WC  . insulin aspart  3 Units Subcutaneous QAC breakfast  . insulin glargine  30 Units Subcutaneous Daily  . levofloxacin  750 mg Oral Daily  . lisinopril   40 mg Oral Daily  . mouth rinse  15 mL Mouth Rinse BID  . pantoprazole  40 mg Oral Daily  . sodium chloride flush  3 mL Intravenous Q12H  . tamsulosin  0.4 mg Oral Daily  . verapamil  180 mg Oral BID   Continuous Infusions: . sodium chloride     PRN Meds:.sodium chloride, acetaminophen, chlorproMAZINE, ondansetron (ZOFRAN) IV, sodium chloride flush   Assessment/Plan: #1. CHF. Previous EF normal last year by echo. Continue IV Lasix. Consult cardiology. Repeat echo pending. #2. Possible pneumonia. He had been started on cephalexin by my call partner last week after he called in about cough. Treat with Levaquin 750 mg daily for 7 days. #3. Troponin elevations. No chest pain to suggest acute MI. #4. Diabetes. Continue Lantus and NovoLog. #5. Carotid artery disease. #6. Hypertension. Principal Problem:   CHF exacerbation ([place]) Active Problems:   GERD (gastroesophageal reflux disease)   Carotid artery disease ([place])   Essential hypertension   Type 2 diabetes mellitus with circulatory disorder, with long-term current use of insulin (HCC)   Acute respiratory failure with hypoxia ([place])  LOS: 1 day   Katyana Trolinger 06/09/2016, 7:22 AM

## 2016-06-09 NOTE — Progress Notes (Signed)
Echo shows stable LVEF, evidence of diastolic dysfunction. Suspect primary issue is acute diastolic HF. Continue diuresis, do not plan for repeat ischemic testing at this time.    Carlyle Dolly MD

## 2016-06-10 DIAGNOSIS — I5031 Acute diastolic (congestive) heart failure: Secondary | ICD-10-CM

## 2016-06-10 LAB — BASIC METABOLIC PANEL
Anion gap: 8 (ref 5–15)
BUN: 22 mg/dL — AB (ref 6–20)
CO2: 27 mmol/L (ref 22–32)
CREATININE: 1.15 mg/dL (ref 0.61–1.24)
Calcium: 8.3 mg/dL — ABNORMAL LOW (ref 8.9–10.3)
Chloride: 104 mmol/L (ref 101–111)
GFR calc Af Amer: >60 mL/min (ref >60)
GFR, EST NON AFRICAN AMERICAN: 59 mL/min — AB (ref >60)
GLUCOSE: 109 mg/dL — AB (ref 65–99)
POTASSIUM: 3.5 mmol/L (ref 3.5–5.1)
Sodium: 139 mmol/L (ref 135–145)

## 2016-06-10 LAB — GLUCOSE, CAPILLARY: Glucose-Capillary: 99 mg/dL (ref 65–99)

## 2016-06-10 LAB — TROPONIN I: Troponin I: 0.1 ng/mL (ref <0.03)

## 2016-06-10 MED ORDER — POTASSIUM CHLORIDE CRYS ER 20 MEQ PO TBCR
20.0000 meq | EXTENDED_RELEASE_TABLET | Freq: Three times a day (TID) | ORAL | Status: DC
  Administered 2016-06-10: 20 meq via ORAL
  Filled 2016-06-10: qty 1

## 2016-06-10 MED ORDER — POTASSIUM CHLORIDE CRYS ER 20 MEQ PO TBCR: 20.0000 meq | EXTENDED_RELEASE_TABLET | Freq: Every day | ORAL | 12 refills | Status: DC

## 2016-06-10 MED ORDER — FUROSEMIDE 40 MG PO TABS: 40.0000 mg | ORAL_TABLET | Freq: Every day | ORAL | 12 refills | Status: DC

## 2016-06-10 MED ORDER — LISINOPRIL 20 MG PO TABS: 20.0000 mg | ORAL_TABLET | Freq: Every day | ORAL | 12 refills | Status: DC

## 2016-06-10 MED ORDER — FUROSEMIDE 40 MG PO TABS
40.0000 mg | ORAL_TABLET | Freq: Every day | ORAL | Status: DC
  Administered 2016-06-10: 40 mg via ORAL

## 2016-06-10 MED ORDER — LEVOFLOXACIN 750 MG PO TABS: 750.0000 mg | ORAL_TABLET | Freq: Every day | ORAL | 0 refills | Status: DC

## 2016-06-10 MED ORDER — LISINOPRIL 10 MG PO TABS
20.0000 mg | ORAL_TABLET | Freq: Every day | ORAL | Status: DC
  Administered 2016-06-10: 20 mg via ORAL

## 2016-06-10 NOTE — Progress Notes (Signed)
Subjective: Shortness of breath is much improved he states. Weight down 7 pounds. He reports of brisk diuresis.  Objective: Vital signs in last 24 hours: Vitals:   06/09/16 0633 06/09/16 1300 06/09/16 2051 06/10/16 0500  BP:  (!) 99/40 (!) 120/56 (!) 107/51  Pulse:  77 71 84  Resp: 16  20 20   Temp:  98 F (36.7 C) 97.8 F (36.6 C) 97.7 F (36.5 C)  TempSrc:  Oral Oral Oral  SpO2: 96% 93% 94% 98%  Weight:    190 lb (86.2 kg)  Height:       Weight change: -7 lb (-3.175 kg)  Intake/Output Summary (Last 24 hours) at 06/10/16 0716 Last data filed at 06/10/16 0300  Gross per 24 hour  Intake              960 ml  Output             1350 ml  Net             -390 ml    Physical Exam: Breathing comfortably. Lungs reveal decreased rales. Heart regular with a grade 1 systolic murmur. Extremities reveal no edema.  Lab Results:    Results for orders placed or performed during the hospital encounter of 06/08/16 (from the past 24 hour(s))  Glucose, capillary     Status: None   Collection Time: 06/09/16  7:38 AM  Result Value Ref Range   Glucose-Capillary 92 65 - 99 mg/dL  Influenza panel by PCR (type A & B)     Status: None   Collection Time: 06/09/16  9:02 AM  Result Value Ref Range   Influenza A By PCR NEGATIVE NEGATIVE   Influenza B By PCR NEGATIVE NEGATIVE  Troponin I     Status: Abnormal   Collection Time: 06/09/16 10:12 AM  Result Value Ref Range   Troponin I 0.11 (HH) <0.03 ng/mL  Glucose, capillary     Status: Abnormal   Collection Time: 06/09/16 11:18 AM  Result Value Ref Range   Glucose-Capillary 136 (H) 65 - 99 mg/dL  Glucose, capillary     Status: Abnormal   Collection Time: 06/09/16  4:35 PM  Result Value Ref Range   Glucose-Capillary 101 (H) 65 - 99 mg/dL  Glucose, capillary     Status: Abnormal   Collection Time: 06/09/16  8:54 PM  Result Value Ref Range   Glucose-Capillary 266 (H) 65 - 99 mg/dL  Basic metabolic panel     Status: Abnormal   Collection Time:  06/10/16  5:09 AM  Result Value Ref Range   Sodium 139 135 - 145 mmol/L   Potassium 3.5 3.5 - 5.1 mmol/L   Chloride 104 101 - 111 mmol/L   CO2 27 22 - 32 mmol/L   Glucose, Bld 109 (H) 65 - 99 mg/dL   BUN 22 (H) 6 - 20 mg/dL   Creatinine, Ser 1.15 0.61 - 1.24 mg/dL   Calcium 8.3 (L) 8.9 - 10.3 mg/dL   GFR calc non Af Amer 59 (L) >60 mL/min   GFR calc Af Amer >60 >60 mL/min   Anion gap 8 5 - 15  Troponin I     Status: Abnormal   Collection Time: 06/10/16  5:09 AM  Result Value Ref Range   Troponin I 0.10 (HH) <0.03 ng/mL     ABGS No results for input(s): PHART, PO2ART, TCO2, HCO3 in the last 72 hours.  Invalid input(s): PCO2 CULTURES No results found for this or any previous  visit (from the past 240 hour(s)). Studies/Results: Dg Chest 2 View  Result Date: 06/08/2016 CLINICAL DATA:  Subacute onset of shortness of breath on exertion and cough. Initial encounter. EXAM: CHEST  2 VIEW COMPARISON:  Chest radiograph performed 11/03/2015 FINDINGS: The lungs are well-aerated. Vascular congestion is noted. Patchy right midlung airspace opacity raises concern for pneumonia. Increased interstitial markings raise question for superimposed interstitial edema. Small bilateral pleural effusions are suggested. There is no evidence of pneumothorax. The heart is normal in size; the mediastinal contour is within normal limits. No acute osseous abnormalities are seen. IMPRESSION: Vascular congestion. Patchy right midlung airspace opacity raises concern for pneumonia. Increased interstitial markings raise question for superimposed interstitial edema. Small bilateral pleural effusions suggested. Electronically Signed   By: Garald Balding M.D.   On: 06/08/2016 18:54   Micro Results: No results found for this or any previous visit (from the past 240 hour(s)). Studies/Results: Dg Chest 2 View  Result Date: 06/08/2016 CLINICAL DATA:  Subacute onset of shortness of breath on exertion and cough. Initial  encounter. EXAM: CHEST  2 VIEW COMPARISON:  Chest radiograph performed 11/03/2015 FINDINGS: The lungs are well-aerated. Vascular congestion is noted. Patchy right midlung airspace opacity raises concern for pneumonia. Increased interstitial markings raise question for superimposed interstitial edema. Small bilateral pleural effusions are suggested. There is no evidence of pneumothorax. The heart is normal in size; the mediastinal contour is within normal limits. No acute osseous abnormalities are seen. IMPRESSION: Vascular congestion. Patchy right midlung airspace opacity raises concern for pneumonia. Increased interstitial markings raise question for superimposed interstitial edema. Small bilateral pleural effusions suggested. Electronically Signed   By: Garald Balding M.D.   On: 06/08/2016 18:54   Medications:  I have reviewed the patient's current medications Scheduled Meds: . acetaminophen  1,000 mg Oral q morning - 10a  . aspirin  81 mg Oral Daily  . enoxaparin (LOVENOX) injection  1 mg/kg Subcutaneous Q12H  . furosemide  40 mg Intravenous Daily  . gabapentin  300 mg Oral BID  . insulin aspart  0-9 Units Subcutaneous TID WC  . insulin aspart  3 Units Subcutaneous QAC breakfast  . insulin glargine  30 Units Subcutaneous Daily  . levofloxacin  750 mg Oral Daily  . lisinopril  40 mg Oral Daily  . mouth rinse  15 mL Mouth Rinse BID  . pantoprazole  40 mg Oral Daily  . rosuvastatin  20 mg Oral q1800  . sodium chloride flush  3 mL Intravenous Q12H  . tamsulosin  0.4 mg Oral Daily  . verapamil  180 mg Oral BID   Continuous Infusions: . sodium chloride     PRN Meds:.sodium chloride, acetaminophen, chlorproMAZINE, ondansetron (ZOFRAN) IV, sodium chloride flush   Assessment/Plan: #1. Acute diastolic heart failure. Echo reveals normal left ventricular function with an ejection fraction of 55-60%. Diastolic function is abnormal. Continue Lasix. #2. Diabetes. Stable control. Glucose 109. #3.  Possible pneumonia. Continue Levaquin. Principal Problem:   CHF exacerbation ([place]) Active Problems:   GERD (gastroesophageal reflux disease)   Carotid artery disease ([place])   Essential hypertension   Type 2 diabetes mellitus with circulatory disorder, with long-term current use of insulin (HCC)   Acute respiratory failure with hypoxia ([place])     LOS: 2 days   Keithon Mccoin 06/10/2016, 7:16 AM

## 2016-06-10 NOTE — Care Management Note (Signed)
Case Management Note  Patient Details  Name: DEQUANE STRAHAN MRN: [id number] Date of Birth: 1940/08/23  Expected Discharge Date:  06/10/16               Expected Discharge Plan:  Home/Self Care  In-House Referral:  NA  Discharge planning Services  CM Consult  Post Acute Care Choice:  NA Choice offered to:  NA  Status of Service:  Completed, signed off  Additional Comments: Pt discharging home today. Has been weaned from supplemental oxygen.   Sherald Barge, RN 06/10/2016, 11:41 AM

## 2016-06-10 NOTE — Progress Notes (Signed)
Patient discharged home with daughter. IV removed and site intact. Patient discharged with personal belongings, D/C papers, and prescriptions.

## 2016-06-10 NOTE — Progress Notes (Addendum)
Progress Note  Patient Name: Jeremy Patton Date of Encounter: 06/10/2016    Subjective   SOB has resolved, back to baseline.   Inpatient Medications    Scheduled Meds: . acetaminophen  1,000 mg Oral q morning - 10a  . aspirin  81 mg Oral Daily  . enoxaparin (LOVENOX) injection  1 mg/kg Subcutaneous Q12H  . furosemide  40 mg Intravenous Daily  . gabapentin  300 mg Oral BID  . insulin aspart  0-9 Units Subcutaneous TID WC  . insulin aspart  3 Units Subcutaneous QAC breakfast  . insulin glargine  30 Units Subcutaneous Daily  . levofloxacin  750 mg Oral Daily  . lisinopril  40 mg Oral Daily  . mouth rinse  15 mL Mouth Rinse BID  . pantoprazole  40 mg Oral Daily  . potassium chloride  20 mEq Oral TID  . rosuvastatin  20 mg Oral q1800  . sodium chloride flush  3 mL Intravenous Q12H  . tamsulosin  0.4 mg Oral Daily  . verapamil  180 mg Oral BID   Continuous Infusions: . sodium chloride     PRN Meds: sodium chloride, acetaminophen, chlorproMAZINE, ondansetron (ZOFRAN) IV, sodium chloride flush   Vital Signs    Vitals:   06/09/16 0633 06/09/16 1300 06/09/16 2051 06/10/16 0500  BP:  (!) 99/40 (!) 120/56 (!) 107/51  Pulse:  77 71 84  Resp: 16  20 20   Temp:  98 F (36.7 C) 97.8 F (36.6 C) 97.7 F (36.5 C)  TempSrc:  Oral Oral Oral  SpO2: 96% 93% 94% 98%  Weight:    190 lb (86.2 kg)  Height:        Intake/Output Summary (Last 24 hours) at 06/10/16 0914 Last data filed at 06/10/16 0845  Gross per 24 hour  Intake             1080 ml  Output             1350 ml  Net             -270 ml   Filed Weights   06/08/16 2200 06/09/16 0625 06/10/16 0500  Weight: 195 lb (88.5 kg) 191 lb 6.4 oz (86.8 kg) 190 lb (86.2 kg)    Telemetry     - Personally Reviewed-NSR   ECG     - Personally Reviewed  Physical Exam   GEN: No acute distress.   Neck: No JVD Cardiac: RRR, 2/6 systolic murmur at apex, rubs, or gallops.  Respiratory: Clear to auscultation  bilaterally. GI: Soft, nontender, non-distended  MS: No edema; No deformity. Neuro:  Nonfocal  Psych: Normal affect   Labs    Chemistry Recent Labs Lab 06/08/16 1737 06/09/16 0430 06/10/16 0509  NA 141 139 139  K 3.7 3.7 3.5  CL 107 104 104  CO2 25 26 27   GLUCOSE 121* 113* 109*  BUN 16 20 22*  CREATININE 1.09 1.04 1.15  CALCIUM 9.0 8.5* 8.3*  PROT 6.9  --   --   ALBUMIN 3.2*  --   --   AST 18  --   --   ALT 18  --   --   ALKPHOS 62  --   --   BILITOT 0.3  --   --   GFRNONAA >60 >60 59*  GFRAA >60 >60 >60  ANIONGAP 9 9 8      Hematology Recent Labs Lab 06/08/16 1737 06/09/16 0430  WBC 10.1 7.2  RBC 4.16* 3.71*  HGB 12.1* 10.8*  HCT 36.2* 32.1*  MCV 87.0 86.5  MCH 29.1 29.1  MCHC 33.4 33.6  RDW 14.9 14.9  PLT 318 270    Cardiac Enzymes Recent Labs Lab 06/08/16 2229 06/09/16 0430 06/09/16 1012 06/10/16 0509  TROPONINI 0.12* 0.11* 0.11* 0.10*   No results for input(s): TROPIPOC in the last 168 hours.   BNP Recent Labs Lab 06/08/16 1737  BNP 433.0*     DDimer No results for input(s): DDIMER in the last 168 hours.   Radiology    Dg Chest 2 View  Result Date: 06/08/2016 CLINICAL DATA:  Subacute onset of shortness of breath on exertion and cough. Initial encounter. EXAM: CHEST  2 VIEW COMPARISON:  Chest radiograph performed 11/03/2015 FINDINGS: The lungs are well-aerated. Vascular congestion is noted. Patchy right midlung airspace opacity raises concern for pneumonia. Increased interstitial markings raise question for superimposed interstitial edema. Small bilateral pleural effusions are suggested. There is no evidence of pneumothorax. The heart is normal in size; the mediastinal contour is within normal limits. No acute osseous abnormalities are seen. IMPRESSION: Vascular congestion. Patchy right midlung airspace opacity raises concern for pneumonia. Increased interstitial markings raise question for superimposed interstitial edema. Small bilateral  pleural effusions suggested. Electronically Signed   By: Garald Balding M.D.   On: 06/08/2016 18:54    Cardiac Studies    Patient Profile     76 y.o. male admitted with SOB   Assessment & Plan    1. Acute diastolic heart failure - echo LVEF 55-60%, abnormal diastolic function indeterminate grade. DIlated IVC - he is negative 446mL yesterday, negative 900 mL since admission. He is on lasix 40mg  IV daily. Mild uptrend in Cr - we will change to oral lasix 40mg  daily.   - we will have patient ambulate with nursing staff. If does well ok to discharge home. We will arrange f/u in 1-2 weeks.  2. Mitral regurgitation - mild to moderate - continue to moderate  3.Pneumonia - management per primary team  4. Elevated troponin - mild flat elevation not consistent with ACS - stress test last year without clear ischemia - no chest pain. SOB improving with diuresis and management of pneumonia - no plans for repeat ischemic testing at this time.   5. HTN - controlled. With normal LV systolic function ok to continue current verapamil - low normal bp's at times, we will lower lisinopril to 20mg  daily at this time.     Merrily Pew, MD  06/10/2016, 9:14 AM

## 2016-06-10 NOTE — Care Management Important Message (Signed)
Important Message  Patient Details  Name: Jeremy Patton MRN: [id number] Date of Birth: Aug 17, 1940   Medicare Important Message Given:  Yes    Sherald Barge, RN 06/10/2016, 11:40 AM

## 2016-06-10 NOTE — Progress Notes (Signed)
SATURATION QUALIFICATIONS: (This note is used to comply with regulatory documentation for home oxygen)  Patient Saturations on Room Air at Rest = 99%  Patient Saturations on Room Air while Ambulating = 94%%  Patient showed no symptoms of SOB or difficulty walking.

## 2016-06-11 ENCOUNTER — Encounter: Payer: Self-pay | Admitting: Vascular Surgery

## 2016-06-11 NOTE — Discharge Summary (Signed)
Physician Discharge Summary  Jeremy Patton LKG:[id number] DOB: 1941/01/08 DOA: 06/08/2016   Admit date: 06/08/2016 Discharge date: 06/11/2016  Discharge Diagnoses:  Principal Problem:   CHF exacerbation ([place]) Active Problems:   GERD (gastroesophageal reflux disease)   Carotid artery disease ([place])   Essential hypertension   Type 2 diabetes mellitus with circulatory disorder, with long-term current use of insulin (HCC)   Acute respiratory failure with hypoxia ([place])    Wt Readings from Last 3 Encounters:  06/10/16 190 lb (86.2 kg)  07/10/15 186 lb (84.4 kg)  06/25/15 194 lb 10.7 oz (88.3 kg)     Hospital Course:  This patient is a 76 year old male with a history of diabetes and hypertension who presented with shortness of breath and dyspnea on exertion. He had also had recent cough and hemoptysis. He denied fever. Chest x-ray was consistent with CHF and possible right-sided pneumonia. He was treated with intravenous Lasix. He was treated with levofloxacin. His shortness of breath greatly improved. He had initially been hypoxic and his hypoxia resolved. He was seen in consultation by cardiology. He had mild troponin elevations which were felt to be likely demand ischemia related. A repeat echocardiogram revealed normal left ventricular function with an ejection fraction of 55-60%. He did have abnormal diastolic function. He had mild to moderate mitral regurgitation. His BNP was in the 400 range.  With IV diuretic therapy has symptoms promptly improved. He was stable for discharge on 4/21. He will be seen in follow-up in the office in one week. He will also have cardiology follow-up.  Lisinopril HCT is being modified to lisinopril 20 mg only without hydrochlorothiazide. He will continue Lasix at 40 mg daily orally. Pioglitazone is being discontinued. Daily potassium will be initiated. He will complete antibiotic therapy with levofloxacin for another 5 days.     Discharge  Instructions  Discharge Instructions    Diet - low sodium heart healthy    Complete by:  As directed    Increase activity slowly    Complete by:  As directed      Allergies as of 06/10/2016      Reactions   Protamine    hypotension      Medication List    STOP taking these medications   chlorproMAZINE 25 MG tablet Commonly known as:  THORAZINE   lisinopril-hydrochlorothiazide 20-12.5 MG tablet Commonly known as:  PRINZIDE,ZESTORETIC   pioglitazone 15 MG tablet Commonly known as:  ACTOS     TAKE these medications   acetaminophen 500 MG tablet Commonly known as:  TYLENOL Take 1,000 mg by mouth every morning.   aspirin 81 MG tablet Take 81 mg by mouth daily.   furosemide 40 MG tablet Commonly known as:  LASIX Take 1 tablet (40 mg total) by mouth daily.   gabapentin 300 MG capsule Commonly known as:  NEURONTIN Take 300 mg by mouth 2 (two) times daily.   LANTUS SOLOSTAR 100 UNIT/ML Solostar Pen Generic drug:  Insulin Glargine Inject 30 Units into the skin every morning.   levofloxacin 750 MG tablet Commonly known as:  LEVAQUIN Take 1 tablet (750 mg total) by mouth daily.   lisinopril 20 MG tablet Commonly known as:  PRINIVIL,ZESTRIL Take 1 tablet (20 mg total) by mouth daily.   metFORMIN 1000 MG tablet Commonly known as:  GLUCOPHAGE Take 1,000 mg by mouth 2 (two) times daily with a meal.   NOVOLOG FLEXPEN 100 UNIT/ML FlexPen Generic drug:  insulin aspart Inject 3 Units into the skin every  morning.   pantoprazole 40 MG tablet Commonly known as:  PROTONIX Take 40 mg by mouth daily.   potassium chloride SA 20 MEQ tablet Commonly known as:  K-DUR,KLOR-CON Take 1 tablet (20 mEq total) by mouth daily.   rosuvastatin 20 MG tablet Commonly known as:  CRESTOR Take 1 tablet (20 mg total) by mouth daily. <PLEASE MAKE APPOINTMENT FOR REFILLS> What changed:  additional instructions   tamsulosin 0.4 MG Caps capsule Commonly known as:  FLOMAX Take 0.4 mg by  mouth daily.   verapamil 180 MG CR tablet Commonly known as:  CALAN-SR Take 180 mg by mouth 2 (two) times daily.        Raneisha Bress 06/11/2016

## 2016-06-12 ENCOUNTER — Encounter (HOSPITAL_COMMUNITY): Payer: Self-pay | Admitting: Emergency Medicine

## 2016-06-12 ENCOUNTER — Emergency Department (HOSPITAL_COMMUNITY): Payer: Medicare Other

## 2016-06-12 ENCOUNTER — Inpatient Hospital Stay (HOSPITAL_COMMUNITY)
Admission: EM | Admit: 2016-06-12 | Discharge: 2016-06-14 | DRG: 291 | Disposition: A | Discharge status: Home / Self Care | Payer: Medicare Other | Attending: Internal Medicine | Admitting: Internal Medicine

## 2016-06-12 DIAGNOSIS — M5416 Radiculopathy, lumbar region: Secondary | ICD-10-CM | POA: Diagnosis present

## 2016-06-12 DIAGNOSIS — Z888 Allergy status to other drugs, medicaments and biological substances status: Secondary | ICD-10-CM

## 2016-06-12 DIAGNOSIS — Z9842 Cataract extraction status, left eye: Secondary | ICD-10-CM | POA: Diagnosis not present

## 2016-06-12 DIAGNOSIS — T502X5A Adverse effect of carbonic-anhydrase inhibitors, benzothiadiazides and other diuretics, initial encounter: Secondary | ICD-10-CM | POA: Diagnosis present

## 2016-06-12 DIAGNOSIS — I110 Hypertensive heart disease with heart failure: Principal | ICD-10-CM | POA: Diagnosis present

## 2016-06-12 DIAGNOSIS — Z823 Family history of stroke: Secondary | ICD-10-CM

## 2016-06-12 DIAGNOSIS — I509 Heart failure, unspecified: Secondary | ICD-10-CM

## 2016-06-12 DIAGNOSIS — N179 Acute kidney failure, unspecified: Secondary | ICD-10-CM

## 2016-06-12 DIAGNOSIS — K219 Gastro-esophageal reflux disease without esophagitis: Secondary | ICD-10-CM | POA: Diagnosis present

## 2016-06-12 DIAGNOSIS — Z7982 Long term (current) use of aspirin: Secondary | ICD-10-CM | POA: Diagnosis not present

## 2016-06-12 DIAGNOSIS — J9601 Acute respiratory failure with hypoxia: Secondary | ICD-10-CM | POA: Diagnosis present

## 2016-06-12 DIAGNOSIS — Z800 Family history of malignant neoplasm of digestive organs: Secondary | ICD-10-CM | POA: Diagnosis not present

## 2016-06-12 DIAGNOSIS — Z794 Long term (current) use of insulin: Secondary | ICD-10-CM | POA: Diagnosis not present

## 2016-06-12 DIAGNOSIS — Z7984 Long term (current) use of oral hypoglycemic drugs: Secondary | ICD-10-CM

## 2016-06-12 DIAGNOSIS — J189 Pneumonia, unspecified organism: Secondary | ICD-10-CM | POA: Diagnosis present

## 2016-06-12 DIAGNOSIS — E785 Hyperlipidemia, unspecified: Secondary | ICD-10-CM | POA: Diagnosis present

## 2016-06-12 DIAGNOSIS — I5033 Acute on chronic diastolic (congestive) heart failure: Secondary | ICD-10-CM | POA: Diagnosis present

## 2016-06-12 DIAGNOSIS — I10 Essential (primary) hypertension: Secondary | ICD-10-CM | POA: Diagnosis present

## 2016-06-12 DIAGNOSIS — Y95 Nosocomial condition: Secondary | ICD-10-CM | POA: Diagnosis present

## 2016-06-12 DIAGNOSIS — Z961 Presence of intraocular lens: Secondary | ICD-10-CM | POA: Diagnosis present

## 2016-06-12 DIAGNOSIS — Z9841 Cataract extraction status, right eye: Secondary | ICD-10-CM | POA: Diagnosis not present

## 2016-06-12 DIAGNOSIS — Z8249 Family history of ischemic heart disease and other diseases of the circulatory system: Secondary | ICD-10-CM | POA: Diagnosis not present

## 2016-06-12 DIAGNOSIS — E1159 Type 2 diabetes mellitus with other circulatory complications: Secondary | ICD-10-CM | POA: Diagnosis present

## 2016-06-12 DIAGNOSIS — Z87891 Personal history of nicotine dependence: Secondary | ICD-10-CM | POA: Diagnosis not present

## 2016-06-12 DIAGNOSIS — R0602 Shortness of breath: Secondary | ICD-10-CM | POA: Diagnosis present

## 2016-06-12 HISTORY — DX: Heart failure, unspecified: I50.9

## 2016-06-12 LAB — I-STAT TROPONIN, ED: Troponin i, poc: 0.05 ng/mL (ref 0.00–0.08)

## 2016-06-12 LAB — I-STAT ARTERIAL BLOOD GAS, ED
ACID-BASE DEFICIT: 5 mmol/L — AB (ref 0.0–2.0)
BICARBONATE: 19.2 mmol/L — AB (ref 20.0–28.0)
O2 SAT: 92 %
PH ART: 7.367 (ref 7.350–7.450)
PO2 ART: 65 mmHg — AB (ref 83.0–108.0)
TCO2: 20 mmol/L (ref 0–100)
pCO2 arterial: 33.4 mmHg (ref 32.0–48.0)

## 2016-06-12 LAB — URINALYSIS, ROUTINE W REFLEX MICROSCOPIC
BACTERIA UA: NONE SEEN
BILIRUBIN URINE: NEGATIVE
Hgb urine dipstick: NEGATIVE
KETONES UR: NEGATIVE mg/dL
LEUKOCYTES UA: NEGATIVE
NITRITE: NEGATIVE
PH: 5 (ref 5.0–8.0)
PROTEIN: NEGATIVE mg/dL
Specific Gravity, Urine: 1.006 (ref 1.005–1.030)
Squamous Epithelial / LPF: NONE SEEN
WBC, UA: NONE SEEN WBC/hpf (ref 0–5)

## 2016-06-12 LAB — CBC
HEMATOCRIT: 35.6 % — AB (ref 39.0–52.0)
HEMOGLOBIN: 11.4 g/dL — AB (ref 13.0–17.0)
MCH: 27.7 pg (ref 26.0–34.0)
MCHC: 32 g/dL (ref 30.0–36.0)
MCV: 86.6 fL (ref 78.0–100.0)
Platelets: 324 10*3/uL (ref 150–400)
RBC: 4.11 MIL/uL — ABNORMAL LOW (ref 4.22–5.81)
RDW: 14.7 % (ref 11.5–15.5)
WBC: 8.1 10*3/uL (ref 4.0–10.5)

## 2016-06-12 LAB — BASIC METABOLIC PANEL
ANION GAP: 12 (ref 5–15)
BUN: 22 mg/dL — ABNORMAL HIGH (ref 6–20)
CHLORIDE: 104 mmol/L (ref 101–111)
CO2: 21 mmol/L — AB (ref 22–32)
Calcium: 8.6 mg/dL — ABNORMAL LOW (ref 8.9–10.3)
Creatinine, Ser: 1.29 mg/dL — ABNORMAL HIGH (ref 0.61–1.24)
GFR calc Af Amer: 60 mL/min — ABNORMAL LOW (ref >60)
GFR calc non Af Amer: 51 mL/min — ABNORMAL LOW (ref >60)
GLUCOSE: 211 mg/dL — AB (ref 65–99)
POTASSIUM: 4 mmol/L (ref 3.5–5.1)
Sodium: 137 mmol/L (ref 135–145)

## 2016-06-12 LAB — I-STAT CG4 LACTIC ACID, ED
Lactic Acid, Venous: 1.95 mmol/L (ref 0.5–1.9)
Lactic Acid, Venous: 1.25 mmol/L (ref 0.5–1.9)

## 2016-06-12 LAB — EXPECTORATED SPUTUM ASSESSMENT W GRAM STAIN, RFLX TO RESP C

## 2016-06-12 LAB — BRAIN NATRIURETIC PEPTIDE: B Natriuretic Peptide: 392.3 pg/mL — ABNORMAL HIGH (ref 0.0–100.0)

## 2016-06-12 LAB — EXPECTORATED SPUTUM ASSESSMENT W REFEX TO RESP CULTURE: SPECIAL REQUESTS: NORMAL

## 2016-06-12 LAB — CBG MONITORING, ED: GLUCOSE-CAPILLARY: 227 mg/dL — AB (ref 65–99)

## 2016-06-12 LAB — STREP PNEUMONIAE URINARY ANTIGEN: STREP PNEUMO URINARY ANTIGEN: NEGATIVE

## 2016-06-12 LAB — GLUCOSE, CAPILLARY: Glucose-Capillary: 202 mg/dL — ABNORMAL HIGH (ref 65–99)

## 2016-06-12 MED ORDER — METFORMIN HCL 500 MG PO TABS
1000.0000 mg | ORAL_TABLET | Freq: Two times a day (BID) | ORAL | Status: DC
  Administered 2016-06-12 – 2016-06-13 (×2): 1000 mg via ORAL
  Filled 2016-06-12 (×2): qty 2

## 2016-06-12 MED ORDER — VANCOMYCIN HCL IN DEXTROSE 750-5 MG/150ML-% IV SOLN
750.0000 mg | Freq: Two times a day (BID) | INTRAVENOUS | Status: DC
  Administered 2016-06-12 – 2016-06-14 (×4): 750 mg via INTRAVENOUS
  Filled 2016-06-12 (×4): qty 150

## 2016-06-12 MED ORDER — VERAPAMIL HCL ER 180 MG PO TBCR
180.0000 mg | EXTENDED_RELEASE_TABLET | Freq: Two times a day (BID) | ORAL | Status: DC
  Administered 2016-06-12 – 2016-06-14 (×5): 180 mg via ORAL
  Filled 2016-06-12 (×5): qty 1

## 2016-06-12 MED ORDER — ASPIRIN EC 81 MG PO TBEC
81.0000 mg | DELAYED_RELEASE_TABLET | Freq: Every day | ORAL | Status: DC
Start: 2016-06-12 — End: 2016-06-14
  Administered 2016-06-12 – 2016-06-14 (×3): 81 mg via ORAL
  Filled 2016-06-12 (×4): qty 1

## 2016-06-12 MED ORDER — PANTOPRAZOLE SODIUM 40 MG PO TBEC
40.0000 mg | DELAYED_RELEASE_TABLET | Freq: Every day | ORAL | Status: DC
  Administered 2016-06-12 – 2016-06-14 (×3): 40 mg via ORAL
  Filled 2016-06-12 (×3): qty 1

## 2016-06-12 MED ORDER — INSULIN ASPART 100 UNIT/ML ~~LOC~~ SOLN
0.0000 [IU] | Freq: Three times a day (TID) | SUBCUTANEOUS | Status: DC
  Administered 2016-06-12 – 2016-06-13 (×2): 5 [IU] via SUBCUTANEOUS
  Administered 2016-06-13: 3 [IU] via SUBCUTANEOUS
  Administered 2016-06-13 – 2016-06-14 (×2): 5 [IU] via SUBCUTANEOUS

## 2016-06-12 MED ORDER — POTASSIUM CHLORIDE CRYS ER 20 MEQ PO TBCR
40.0000 meq | EXTENDED_RELEASE_TABLET | Freq: Once | ORAL | Status: AC
  Administered 2016-06-12: 40 meq via ORAL
  Filled 2016-06-12 (×2): qty 2

## 2016-06-12 MED ORDER — INSULIN GLARGINE 100 UNIT/ML SOLOSTAR PEN: 30.0000 [IU] | PEN_INJECTOR | SUBCUTANEOUS | Status: DC

## 2016-06-12 MED ORDER — POTASSIUM CHLORIDE CRYS ER 20 MEQ PO TBCR
40.0000 meq | EXTENDED_RELEASE_TABLET | Freq: Two times a day (BID) | ORAL | Status: DC
  Administered 2016-06-12 – 2016-06-14 (×4): 40 meq via ORAL
  Filled 2016-06-12 (×5): qty 2

## 2016-06-12 MED ORDER — NITROGLYCERIN IN D5W 200-5 MCG/ML-% IV SOLN
5.0000 ug/min | INTRAVENOUS | Status: DC
  Administered 2016-06-12: 5 ug/min via INTRAVENOUS
  Filled 2016-06-12: qty 250

## 2016-06-12 MED ORDER — ACETAMINOPHEN 325 MG PO TABS: 650.0000 mg | ORAL_TABLET | ORAL | Status: DC | PRN

## 2016-06-12 MED ORDER — DEXTROSE 5 % IV SOLN
2.0000 g | Freq: Once | INTRAVENOUS | Status: AC
  Administered 2016-06-12: 2 g via INTRAVENOUS
  Filled 2016-06-12: qty 2

## 2016-06-12 MED ORDER — ROSUVASTATIN CALCIUM 20 MG PO TABS
20.0000 mg | ORAL_TABLET | Freq: Every day | ORAL | Status: DC
  Administered 2016-06-12 – 2016-06-13 (×2): 20 mg via ORAL
  Filled 2016-06-12 (×2): qty 1

## 2016-06-12 MED ORDER — CEFEPIME HCL 2 G IJ SOLR
2.0000 g | Freq: Three times a day (TID) | INTRAMUSCULAR | Status: DC
  Filled 2016-06-12: qty 2

## 2016-06-12 MED ORDER — SODIUM CHLORIDE 0.9% FLUSH: 3.0000 mL | INTRAVENOUS | Status: DC | PRN

## 2016-06-12 MED ORDER — LISINOPRIL 20 MG PO TABS
20.0000 mg | ORAL_TABLET | Freq: Every day | ORAL | Status: DC
  Administered 2016-06-12: 20 mg via ORAL
  Filled 2016-06-12: qty 1

## 2016-06-12 MED ORDER — FUROSEMIDE 10 MG/ML IJ SOLN
40.0000 mg | Freq: Once | INTRAMUSCULAR | Status: AC
  Administered 2016-06-12: 40 mg via INTRAVENOUS
  Filled 2016-06-12: qty 4

## 2016-06-12 MED ORDER — TAMSULOSIN HCL 0.4 MG PO CAPS
0.4000 mg | ORAL_CAPSULE | Freq: Every day | ORAL | Status: DC
  Administered 2016-06-12 – 2016-06-14 (×3): 0.4 mg via ORAL
  Filled 2016-06-12 (×3): qty 1

## 2016-06-12 MED ORDER — INSULIN GLARGINE 100 UNIT/ML ~~LOC~~ SOLN
30.0000 [IU] | Freq: Every day | SUBCUTANEOUS | Status: DC
  Administered 2016-06-13 – 2016-06-14 (×2): 30 [IU] via SUBCUTANEOUS
  Filled 2016-06-12 (×2): qty 0.3

## 2016-06-12 MED ORDER — INSULIN ASPART 100 UNIT/ML FLEXPEN: 3.0000 [IU] | PEN_INJECTOR | Freq: Every morning | SUBCUTANEOUS | Status: DC

## 2016-06-12 MED ORDER — GABAPENTIN 300 MG PO CAPS
300.0000 mg | ORAL_CAPSULE | Freq: Two times a day (BID) | ORAL | Status: DC
  Administered 2016-06-12 – 2016-06-14 (×5): 300 mg via ORAL
  Filled 2016-06-12 (×5): qty 1

## 2016-06-12 MED ORDER — VANCOMYCIN HCL IN DEXTROSE 1-5 GM/200ML-% IV SOLN
1000.0000 mg | Freq: Once | INTRAVENOUS | Status: AC
  Administered 2016-06-12: 1000 mg via INTRAVENOUS
  Filled 2016-06-12: qty 200

## 2016-06-12 MED ORDER — SODIUM CHLORIDE 0.9 % IV SOLN
250.0000 mL | INTRAVENOUS | Status: DC | PRN
Start: 2016-06-12 — End: 2016-06-14

## 2016-06-12 MED ORDER — DEXTROSE 5 % IV SOLN
1.0000 g | Freq: Three times a day (TID) | INTRAVENOUS | Status: DC
  Administered 2016-06-12 – 2016-06-13 (×3): 1 g via INTRAVENOUS
  Filled 2016-06-12 (×4): qty 1

## 2016-06-12 MED ORDER — INSULIN ASPART 100 UNIT/ML ~~LOC~~ SOLN
3.0000 [IU] | Freq: Every day | SUBCUTANEOUS | Status: DC
  Administered 2016-06-13 – 2016-06-14 (×2): 3 [IU] via SUBCUTANEOUS

## 2016-06-12 MED ORDER — FUROSEMIDE 10 MG/ML IJ SOLN
40.0000 mg | Freq: Two times a day (BID) | INTRAMUSCULAR | Status: DC
  Administered 2016-06-12 – 2016-06-14 (×4): 40 mg via INTRAVENOUS
  Filled 2016-06-12 (×4): qty 4

## 2016-06-12 MED ORDER — SODIUM CHLORIDE 0.9% FLUSH
3.0000 mL | Freq: Two times a day (BID) | INTRAVENOUS | Status: DC
  Administered 2016-06-12 – 2016-06-13 (×4): 3 mL via INTRAVENOUS

## 2016-06-12 MED ORDER — ENOXAPARIN SODIUM 40 MG/0.4ML ~~LOC~~ SOLN
40.0000 mg | SUBCUTANEOUS | Status: DC
  Administered 2016-06-12 – 2016-06-13 (×2): 40 mg via SUBCUTANEOUS
  Filled 2016-06-12 (×2): qty 0.4

## 2016-06-12 MED ORDER — ONDANSETRON HCL 4 MG/2ML IJ SOLN: 4.0000 mg | Freq: Four times a day (QID) | INTRAMUSCULAR | Status: DC | PRN

## 2016-06-12 NOTE — Progress Notes (Signed)
Patient arrived in the unit accompanied by NT via stretcher. Orientation to the unit given. Patient verbalizes understanding. 

## 2016-06-12 NOTE — Progress Notes (Addendum)
Pharmacy Antibiotic Note  Jeremy Patton is a 76 y.o. male admitted on 06/12/2016 with pneumonia.  Pharmacy has been consulted for Vancomycin and Cefepime dosing. Pt just discharged from [facility] 4/19 after treatment for CHF exacerbation vs PNA - given Levaquin for PNA treatment.  Plan: Cefepime 1gm IV q8h Vancomycin 1gm now then 750mg  IV q12h Will f/u micro data, renal function, and pt's clinical condition Vanc trough prn   Height: 5\' 11"  (180.3 cm) Weight: 190 lb (86.2 kg) IBW/kg (Calculated) : 75.3  Temp (24hrs), Avg:97.6 F (36.4 C), Min:97.6 F (36.4 C), Max:97.6 F (36.4 C)   Recent Labs Lab 06/08/16 1737 06/09/16 0430 06/10/16 0509  WBC 10.1 7.2  --   CREATININE 1.09 1.04 1.15    Estimated Creatinine Clearance: 56.4 mL/min (by C-G formula based on SCr of 1.15 mg/dL).    Allergies  Allergen Reactions  . Protamine     hypotension    Antimicrobials this admission: 4/21 Vanc >>  4/21 Cefepime >>   Dose adjustments this admission: n/a  Microbiology results:  BCx x2:   Thank you for allowing pharmacy to be a part of this patient's care.  Sherlon Handing, PharmD, BCPS Clinical pharmacist, pager [phone] 06/12/2016 7:07 AM   Addended, changing cefepime dose to 1gm Q8H: Myer Peer Grayland Ormond), PharmD  PGY1 Pharmacy Resident Pager: [phone] 06/12/2016 12:11 PM

## 2016-06-12 NOTE — ED Triage Notes (Signed)
Discharged from [facility] on Thursday after having Pneumonia and CHF.  Reports doing okay until during the night tonight.  Continues to cough up pink tinged sputum and is labored in triage.  Sat initially 76% on room air.  Up to 89-90% on 3L [place].  Took several minutes for sat to go up after placing on oxygen.

## 2016-06-12 NOTE — H&P (Signed)
History and Physical    Jeremy Patton IHK:[id number] DOB: Jul 21, 1940 DOA: 06/12/2016  PCP: Asencion Noble, MD   Patient coming from: home  Chief Complaint:worsening, progresive SOB HPI: Jeremy Patton is a 76 y.o. male with medical history significant of CHF, DM, HTN, PAD with right sided carotid bruit, hyperlipidemia, GERD who presented to the ED with c/o increasing SOB.  Patient was hospitalized to [facility] with PNA superimposed on acute exacerbation of CHF and discharged on 06/10/2016 on Levaquin. However, his condition after discharge started to deteriorate as he continued having productive cough with pink tinged sputum and the breathing was getting more labored. The SOB was increasing and during last night patient patient could not catch his breath at all, he also noted abdominal fullness, leg swelling. Eventually he arrived to the ED for evaluation  ED Course: upon arrival his SpO2 was 76% on RA and improved to 85%after he was placed on 6L O2 [place]. His ABG showed normal pH, low pO2 - 65%, norma pCO2 and low bicarb 19.2 He is afebrile with temperature 97.38F, tachycardic, tachypneic, brother, but maintained his blood pressure while Blood work demonstrated renal insufficiency with creatinine 1.29 and this is new, normal white blood cells count, mild anemia with hemoglobin 11.4 g/dL which is near his baseline. Lactic acid was marginally elevated at 1.95 Chest x-ray demonstrated some interval worsening diffuse bilateral interstitial opacities probably consistent with worsening pulmonary edema. Also worsening multifocal consolidative opacity in the right upper lobe  UA was not suggestive of UTI  In the emergency department code sepsis was initiated, however, patient has not received fluids due to given xray and clinical status suggestive of FVO. Troponin was normal at 0.05, last troponin a discharge from [facility] was 0.10; BNP was mildly elevated 392.3, which normal given  patient's age  Review of Systems: As per HPI otherwise 10 point review of systems negative.   Ambulatory Status: Independent  Past Medical History:  Diagnosis Date  . Carotid artery occlusion    Bruit, Right  . CHF (congestive heart failure) ([place])   . Diabetes mellitus without complication ([place]) 75/07/4330  . GERD (gastroesophageal reflux disease)   . Hyperlipidemia 11/29/2011  . Hypertension 11/29/2011  . Low back pain 11/29/2011  . Lumbar radiculopathy 11/29/2011   Left  . Pain of left lower leg   . Wears glasses     Past Surgical History:  Procedure Laterality Date  . CATARACT EXTRACTION W/ INTRAOCULAR LENS  IMPLANT, BILATERAL    . CIRCUMCISION     Age 32  . COLONOSCOPY  08/22/2005 and  07/31/2009   minimal internal hemorrhoids, left-sided diverticulosis  . COLONOSCOPY N/A 11/25/2014   Procedure: COLONOSCOPY;  Surgeon: Daneil Dolin, MD;  [facility];  Service: Endoscopy;  Laterality: N/A;  1030  . ENDARTERECTOMY Right 05/06/2015   Procedure: Right Carotid ENDARTERECTOMY ;  Surgeon: Conrad [place], MD;  Location: [place];  Service: Vascular;  Laterality: Right;  . ENDARTERECTOMY Left 06/25/2015   Procedure: LEFT CAROTID ENDARTERECTOMY;  Surgeon: Conrad , MD;  Location: [place];  Service: Vascular;  Laterality: Left;  . ESOPHAGEAL DILATION N/A 11/25/2014   Procedure: ESOPHAGEAL DILATION;  Surgeon: Daneil Dolin, MD;  [facility];  Service: Endoscopy;  Laterality: N/A;  . ESOPHAGOGASTRODUODENOSCOPY N/A 11/25/2014   Procedure: ESOPHAGOGASTRODUODENOSCOPY (EGD);  Surgeon: Daneil Dolin, MD;  [facility];  Service: Endoscopy;  Laterality: N/A;  . EYE SURGERY    . MULTIPLE TOOTH  EXTRACTIONS    . PATCH ANGIOPLASTY Right 05/06/2015   Procedure: PATCH ANGIOPLASTY;  Surgeon: Conrad [place], MD;  Location: [place];  Service: Vascular;  Laterality: Right;  . PATCH ANGIOPLASTY Left 06/25/2015   Procedure: PATCH ANGIOPLASTY USING XENOSURE BIOLOGIC PATCH 1cm X 6cm ;   Surgeon: Conrad [place], MD;  Location: [place];  Service: Vascular;  Laterality: Left;  . TONSILLECTOMY      Social History   Social History  . Marital status: Married    Spouse name: N/A  . Number of children: N/A  . Years of education: N/A   Occupational History  . Not on file.   Social History Main Topics  . Smoking status: Former Smoker    Packs/day: 2.00    Years: 30.00    Types: Cigarettes    Quit date: 12/17/1991  . Smokeless tobacco: Never Used  . Alcohol use No  . Drug use: No  . Sexual activity: Not on file   Other Topics Concern  . Not on file   Social History Narrative  . No narrative on file    Allergies  Allergen Reactions  . Protamine     hypotension    Family History  Problem Relation Age of Onset  . Stroke Mother   . Heart disease Mother     before age 69  . Colon cancer Brother     Age 37    Prior to Admission medications   Medication Sig Start Date End Date Taking? Authorizing Provider  acetaminophen (TYLENOL) 500 MG tablet Take 1,000 mg by mouth every morning.    Yes Historical Provider, MD  aspirin 81 MG tablet Take 81 mg by mouth daily.   Yes Historical Provider, MD  furosemide (LASIX) 40 MG tablet Take 1 tablet (40 mg total) by mouth daily. 06/10/16  Yes Asencion Noble, MD  gabapentin (NEURONTIN) 300 MG capsule Take 300 mg by mouth 2 (two) times daily.  11/24/11  Yes Historical Provider, MD  insulin aspart (NOVOLOG FLEXPEN) 100 UNIT/ML FlexPen Inject 3 Units into the skin every morning.   Yes Historical Provider, MD  LANTUS SOLOSTAR 100 UNIT/ML Solostar Pen Inject 30 Units into the skin every morning.  05/15/15  Yes Historical Provider, MD  levofloxacin (LEVAQUIN) 750 MG tablet Take 1 tablet (750 mg total) by mouth daily. 06/11/16  Yes Asencion Noble, MD  lisinopril (PRINIVIL,ZESTRIL) 20 MG tablet Take 1 tablet (20 mg total) by mouth daily. 06/11/16  Yes Asencion Noble, MD  metFORMIN (GLUCOPHAGE) 1000 MG tablet Take 1,000 mg by mouth 2 (two) times daily with  a meal.  11/24/11  Yes Historical Provider, MD  pantoprazole (PROTONIX) 40 MG tablet Take 40 mg by mouth daily.  04/18/15  Yes Historical Provider, MD  potassium chloride SA (K-DUR,KLOR-CON) 20 MEQ tablet Take 1 tablet (20 mEq total) by mouth daily. 06/10/16  Yes Asencion Noble, MD  rosuvastatin (CRESTOR) 20 MG tablet Take 1 tablet (20 mg total) by mouth daily. <PLEASE MAKE APPOINTMENT FOR REFILLS> Patient taking differently: Take 20 mg by mouth daily.  04/01/16  Yes Troy Sine, MD  Tamsulosin HCl (FLOMAX) 0.4 MG CAPS Take 0.4 mg by mouth daily.  11/24/11  Yes Historical Provider, MD  verapamil (CALAN-SR) 180 MG CR tablet Take 180 mg by mouth 2 (two) times daily.   Yes Historical Provider, MD    Physical Exam: Vitals:   06/12/16 0945 06/12/16 1000 06/12/16 1014 06/12/16 1017  BP: (!) 149/77   136/75  Pulse: Marland Kitchen)  106 (!) 113  99  Resp: (!) 23   20  Temp:      TempSrc:      SpO2: 98% 96% 93% 94%  Weight:      Height:         General: Appears calm and comfortable Eyes: PERRLA, EOMI, normal lids, iris ENT:  grossly normal hearing, lips & tongue, mucous membranes moist and intact Neck: no lymphoadenopathy, masses or thyromegaly Cardiovascular: RRR, no m/r/g. No JVD, carotid bruits. No LE edema.  Respiratory: bilateral rales and cracles. Normal respiratory effort. No accessory muscle use observed Abdomen: soft, non-tender, non-distended, no organomegaly or masses appreciated. BS present in all quadrants Skin: no rash, ulcers or induration seen on limited exam Musculoskeletal: grossly normal tone BUE/BLE, good ROM, no bony abnormality or joint deformities observed Psychiatric: grossly normal mood and affect, speech fluent and appropriate, alert and oriented x3 Neurologic: CN II-XII grossly intact, moves all extremities in coordinated fashion, sensation intact  Labs on Admission: I have personally reviewed following labs and imaging studies  CBC, BMP  GFR: Estimated Creatinine Clearance: 50.3  mL/min (A) (by C-G formula based on SCr of 1.29 mg/dL (H)).   Creatinine Clearance: Estimated Creatinine Clearance: 50.3 mL/min (A) (by C-G formula based on SCr of 1.29 mg/dL (H)).   Radiological Exams on Admission: Dg Chest Port 1 View  Result Date: 06/12/2016 CLINICAL DATA:  Patient with history of pulmonary edema. On Lasix. Further evaluation. EXAM: PORTABLE CHEST 1 VIEW COMPARISON:  Chest radiograph 06/08/2016 FINDINGS: Monitoring leads overlie the patient. Stable cardiac and mediastinal contours. Interval worsening diffuse bilateral interstitial pulmonary opacities. Additionally interval increase in more focal consolidation within the right upper lung. Small bilateral pleural effusions. No pneumothorax. IMPRESSION: Interval worsening diffuse bilateral interstitial opacities most compatible with worsening pulmonary edema. Worsening more focal consolidative opacity right upper lung which may represent superimposed pneumonia. Followup PA and lateral chest X-ray is recommended in 3-4 weeks following trial of antibiotic therapy to ensure resolution and exclude underlying malignancy. Small pleural effusions. Electronically Signed   By: Lovey Newcomer M.D.   On: 06/12/2016 08:19    EKG: Independently reviewed - sinus rhythm without acute ST-TW changes   Assessment/Plan Principal Problem:   Acute respiratory failure with hypoxia (HCC) Active Problems:   GERD (gastroesophageal reflux disease)   Essential hypertension   Hyperlipidemia LDL goal <70   Type 2 diabetes mellitus with circulatory disorder, with long-term current use of insulin (HCC)   Acute on chronic diastolic CHF (congestive heart failure) ([place])   HCAP (healthcare-associated pneumonia)   Acute respiratory failure with hypoxia - multifactorial and most likely associated with CHF exacerbation and superimposed PNA Last echocardiogram on 06/09/2016 demonstrated LV EF 55-60% and abnormal diastolic function of indeterminate grade,  suspected inferior hypokinesis Patient was placed on BiPAP on arrival and successfully weaned off BiPAP He was given 40 mg Lasix IV with good urinary response and significant clinical improvement Continue broad-spectrum antibiotics IV, nebulizers when necessary, supplemental oxygen Monitor for urinary output and restrict fluid intake  Hypertension - currently stable Continue home medication and adjust the doses if needed depending on the BP readings  DM type II  Continue Metformin while hospitalized Continue diabetic diet, monitor FSBS QACHS, add sliding scale insulin  History of present illness - could be associated with fluid volume overload Continue to monitor, avoid nephrotoxic drugs  Hyperlipidemia  Continue statin therapy with Crestor  GERD - continue PPI  DVT prophylaxis: Heparin Code Status: full Family Communication: aty bedside  Disposition Plan: telemetry Consults called: none Admission status:    York Grice, PA-C Pager: [phone] [facility]  If 7PM-7AM, please contact night-coverage [email or web address] Password [facility]  06/12/2016, 10:48 AM

## 2016-06-12 NOTE — ED Provider Notes (Signed)
[name] DEPT Provider Note   CSN: [id number] Arrival date & time: 06/12/16  0610     History   Chief Complaint Chief Complaint  Patient presents with  . Shortness of Breath    HPI Jeremy Patton is a 76 y.o. male who  has a past medical history of Carotid artery occlusion; CHF (congestive heart failure) ([place]); Diabetes mellitus without complication ([place]) (62/03/2977); GERD (gastroesophageal reflux disease); Hyperlipidemia (11/29/2011); Hypertension (11/29/2011); Low back pain (11/29/2011); Lumbar radiculopathy (11/29/2011); Pain of left lower leg; and Wears glasses. The patient presents emergency Department with chief complaint of shortness of breath. The patient was discharged from [facility] 2 days ago after an admission for CHF exacerbation and pneumonia. He is currently taking Levaquin. He states that last night he began having worsening shortness of breath along with swelling in his abdomen, and ankles. He has noticed weight gain, but does not specify the number. He denies fevers. He has been compliant with his outpatient medications. Upon arrival to the emergency department. He was noted to have an oxygen saturation in the 70s. He is otherwise not oxygen dependent. He has dyspnea at rest. He denies chest pain, abdominal pain, nausea, vomiting or urinary symptoms.      HPI  Past Medical History:  Diagnosis Date  . Carotid artery occlusion    Bruit, Right  . CHF (congestive heart failure) ([place])   . Diabetes mellitus without complication ([place]) 89/03/1192  . GERD (gastroesophageal reflux disease)   . Hyperlipidemia 11/29/2011  . Hypertension 11/29/2011  . Low back pain 11/29/2011  . Lumbar radiculopathy 11/29/2011   Left  . Pain of left lower leg   . Wears glasses     Patient Active Problem List   Diagnosis Date Noted  . CHF exacerbation ([place]) 06/08/2016  . Acute respiratory failure with hypoxia ([place]) 06/08/2016  . Left carotid artery stenosis 06/25/2015    . Carotid stenosis 05/06/2015  . Preoperative clearance 05/05/2015  . Carotid artery disease ([place]) 05/01/2015  . Dizziness 05/01/2015  . Essential hypertension 05/01/2015  . Hyperlipidemia LDL goal <70 05/01/2015  . Type 2 diabetes mellitus with circulatory disorder, with long-term current use of insulin ([place]) 05/01/2015  . History of colonic polyps   . Diverticulosis of colon without hemorrhage   . Reflux esophagitis   . Mucosal abnormality of stomach   . GERD (gastroesophageal reflux disease) 11/01/2014  . Esophageal dysphagia 11/01/2014  . FH: colon cancer in first degree relative <71 years old 11/01/2014  . Carotid stenosis, bilateral 12/17/2011    Past Surgical History:  Procedure Laterality Date  . CATARACT EXTRACTION W/ INTRAOCULAR LENS  IMPLANT, BILATERAL    . CIRCUMCISION     Age 76  . COLONOSCOPY  08/22/2005 and  07/31/2009   minimal internal hemorrhoids, left-sided diverticulosis  . COLONOSCOPY N/A 11/25/2014   Procedure: COLONOSCOPY;  Surgeon: Daneil Dolin, MD;  [facility];  Service: Endoscopy;  Laterality: N/A;  1030  . ENDARTERECTOMY Right 05/06/2015   Procedure: Right Carotid ENDARTERECTOMY ;  Surgeon: Conrad Dover, MD;  Location: [place];  Service: Vascular;  Laterality: Right;  . ENDARTERECTOMY Left 06/25/2015   Procedure: LEFT CAROTID ENDARTERECTOMY;  Surgeon: Conrad Galva, MD;  Location: [place];  Service: Vascular;  Laterality: Left;  . ESOPHAGEAL DILATION N/A 11/25/2014   Procedure: ESOPHAGEAL DILATION;  Surgeon: Daneil Dolin, MD;  [facility];  Service: Endoscopy;  Laterality: N/A;  . ESOPHAGOGASTRODUODENOSCOPY N/A 11/25/2014   Procedure: ESOPHAGOGASTRODUODENOSCOPY (  EGD);  Surgeon: Daneil Dolin, MD;  [facility];  Service: Endoscopy;  Laterality: N/A;  . EYE SURGERY    . MULTIPLE TOOTH EXTRACTIONS    . PATCH ANGIOPLASTY Right 05/06/2015   Procedure: PATCH ANGIOPLASTY;  Surgeon: Conrad [place], MD;  Location: [place];  Service:  Vascular;  Laterality: Right;  . PATCH ANGIOPLASTY Left 06/25/2015   Procedure: PATCH ANGIOPLASTY USING XENOSURE BIOLOGIC PATCH 1cm X 6cm ;  Surgeon: Conrad Dinuba, MD;  Location: [place];  Service: Vascular;  Laterality: Left;  . TONSILLECTOMY         Home Medications    Prior to Admission medications   Medication Sig Start Date End Date Taking? Authorizing Provider  acetaminophen (TYLENOL) 500 MG tablet Take 1,000 mg by mouth every morning.    Yes Historical Provider, MD  aspirin 81 MG tablet Take 81 mg by mouth daily.   Yes Historical Provider, MD  furosemide (LASIX) 40 MG tablet Take 1 tablet (40 mg total) by mouth daily. 06/10/16  Yes Asencion Noble, MD  gabapentin (NEURONTIN) 300 MG capsule Take 300 mg by mouth 2 (two) times daily.  11/24/11  Yes Historical Provider, MD  insulin aspart (NOVOLOG FLEXPEN) 100 UNIT/ML FlexPen Inject 3 Units into the skin every morning.   Yes Historical Provider, MD  LANTUS SOLOSTAR 100 UNIT/ML Solostar Pen Inject 30 Units into the skin every morning.  05/15/15  Yes Historical Provider, MD  levofloxacin (LEVAQUIN) 750 MG tablet Take 1 tablet (750 mg total) by mouth daily. 06/11/16  Yes Asencion Noble, MD  lisinopril (PRINIVIL,ZESTRIL) 20 MG tablet Take 1 tablet (20 mg total) by mouth daily. 06/11/16  Yes Asencion Noble, MD  metFORMIN (GLUCOPHAGE) 1000 MG tablet Take 1,000 mg by mouth 2 (two) times daily with a meal.  11/24/11  Yes Historical Provider, MD  pantoprazole (PROTONIX) 40 MG tablet Take 40 mg by mouth daily.  04/18/15  Yes Historical Provider, MD  potassium chloride SA (K-DUR,KLOR-CON) 20 MEQ tablet Take 1 tablet (20 mEq total) by mouth daily. 06/10/16  Yes Asencion Noble, MD  rosuvastatin (CRESTOR) 20 MG tablet Take 1 tablet (20 mg total) by mouth daily. <PLEASE MAKE APPOINTMENT FOR REFILLS> Patient taking differently: Take 20 mg by mouth daily.  04/01/16  Yes Troy Sine, MD  Tamsulosin HCl (FLOMAX) 0.4 MG CAPS Take 0.4 mg by mouth daily.  11/24/11  Yes Historical Provider,  MD  verapamil (CALAN-SR) 180 MG CR tablet Take 180 mg by mouth 2 (two) times daily.   Yes Historical Provider, MD    Family History Family History  Problem Relation Age of Onset  . Stroke Mother   . Heart disease Mother     before age 59  . Colon cancer Brother     Age 30    Social History Social History  Substance Use Topics  . Smoking status: Former Smoker    Packs/day: 2.00    Years: 30.00    Types: Cigarettes    Quit date: 12/17/1991  . Smokeless tobacco: Never Used  . Alcohol use No     Allergies   Protamine   Review of Systems Review of Systems Ten systems reviewed and are negative for acute change, except as noted in the HPI. \     Physical Exam Updated Vital Signs BP (!) 160/78 (BP Location: Right Arm)   Pulse (!) 102   Temp 97.6 F (36.4 C) (Oral)   Resp (!) 22   Ht 5\' 11"  (1.803  m)   Wt 86.2 kg   SpO2 (!) 85%   BMI 26.50 kg/m   Physical Exam  Constitutional: He appears well-developed and well-nourished. No distress.  HENT:  Head: Normocephalic and atraumatic.  Eyes: Conjunctivae are normal. No scleral icterus.  Neck: Normal range of motion. Neck supple.  Cardiovascular: Regular rhythm and normal heart sounds.  Tachycardia present.   Pulmonary/Chest: Tachypnea noted.  Coarse crackles audible without stethascope Sitting bolt upright   Abdominal: Soft. There is no tenderness.  Musculoskeletal: He exhibits no edema.  Neurological: He is alert.  Skin: Skin is warm and dry. He is not diaphoretic.  Psychiatric: His behavior is normal.  Nursing note and vitals reviewed.    ED Treatments / Results  Labs (all labs ordered are listed, but only abnormal results are displayed) Labs Reviewed  CULTURE, BLOOD (ROUTINE X 2)  CULTURE, BLOOD (ROUTINE X 2)  BASIC METABOLIC PANEL  CBC  URINALYSIS, ROUTINE W REFLEX MICROSCOPIC  BRAIN NATRIURETIC PEPTIDE  I-STAT TROPOININ, ED  I-STAT CG4 LACTIC ACID, ED    EKG  EKG Interpretation None        Radiology No results found.  Procedures .Critical Care Performed by: Margarita Mail Authorized by: Margarita Mail   Critical care provider statement:    Critical care time (minutes):  75   Critical care was necessary to treat or prevent imminent or life-threatening deterioration of the following conditions:  Respiratory failure   Critical care was time spent personally by me on the following activities:  Development of treatment plan with patient or surrogate, discussions with primary provider, evaluation of patient's response to treatment, examination of patient, interpretation of cardiac output measurements, obtaining history from patient or surrogate, ordering and review of laboratory studies, ordering and review of radiographic studies, pulse oximetry, re-evaluation of patient's condition and review of old charts   (including critical care time)  Medications Ordered in ED Medications  ceFEPIme (MAXIPIME) 2 g in dextrose 5 % 50 mL IVPB (not administered)  vancomycin (VANCOCIN) IVPB 1000 mg/200 mL premix (not administered)  furosemide (LASIX) injection 40 mg (not administered)  potassium chloride SA (K-DUR,KLOR-CON) CR tablet 40 mEq (not administered)  nitroGLYCERIN 50 mg in dextrose 5 % 250 mL (0.2 mg/mL) infusion (not administered)     Initial Impression / Assessment and Plan / ED Course  I have reviewed the triage vital signs and the nursing notes.  Pertinent labs & imaging results that were available during my care of the patient were reviewed by me and considered in my medical decision making (see chart for details).  Clinical Course as of Jun 12 1640  Sat Jun 12, 2016  0736 Patient with tachypnea, tachycardia, hypoxia. DDX includes worsening HCAP/ CHF/ PE.  Clinically he appears volume overloaded. The patient is hypoxic on 6 L in the mid 80s. Given this fact, I feel that diuresing and starting with BiPAP is the best course of action. If the patient is remaining  hypoxic. He may need a pulmonary embolus workup. 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 We will, however initiated the sepsis protocol given his outpatient treatment on Levaquin for pneumonia tachypnea, tachycardia, and hypoxia   [AH]  0807 Patient's abg shows pure hypoxia. Bicarb is low. PH is normal.  pO2, Arterial: (!) 65.0 [AH]  0848 Patient appears improved on bipap His tachypnea Is improved. He has urinated 3 times  [AH]  9562 Patient's CXR show  [AH]  1308 CXR shows worsening Rmiddle lobe opacity and pulmonary edema. Patient  will need admission for failed OP pneumonia tx and volume overload. I doubt sepsis as he is afebrile and his lactic acid is only minimally elevated.  DG Chest Port 1 View [AH]    Clinical Course User Index [AH] Margarita Mail, PA-C    Patient with volume overload, and worsening HCAP. Patient improved with interventions. He will be admitted to the step down unit.  Fin wilal Clinical Impressions(s) / ED Diagnoses   Final diagnoses:  Acute respiratory failure with hypoxia ([place])  HCAP (healthcare-associated pneumonia)  Acute on chronic congestive heart failure, unspecified heart failure type [facility])    New Prescriptions New Prescriptions   No medications on file     Margarita Mail, PA-C 06/12/16 [place], MD 06/16/16 1008

## 2016-06-12 NOTE — ED Notes (Signed)
Called pharmacy about Potassium tablet

## 2016-06-13 DIAGNOSIS — J189 Pneumonia, unspecified organism: Secondary | ICD-10-CM

## 2016-06-13 DIAGNOSIS — I5033 Acute on chronic diastolic (congestive) heart failure: Secondary | ICD-10-CM

## 2016-06-13 DIAGNOSIS — J9601 Acute respiratory failure with hypoxia: Secondary | ICD-10-CM

## 2016-06-13 LAB — GLUCOSE, CAPILLARY
GLUCOSE-CAPILLARY: 177 mg/dL — AB (ref 65–99)
GLUCOSE-CAPILLARY: 210 mg/dL — AB (ref 65–99)
GLUCOSE-CAPILLARY: 226 mg/dL — AB (ref 65–99)
Glucose-Capillary: 236 mg/dL — ABNORMAL HIGH (ref 65–99)

## 2016-06-13 LAB — BASIC METABOLIC PANEL
Anion gap: 8 (ref 5–15)
BUN: 21 mg/dL — AB (ref 6–20)
CHLORIDE: 106 mmol/L (ref 101–111)
CO2: 25 mmol/L (ref 22–32)
Calcium: 8.5 mg/dL — ABNORMAL LOW (ref 8.9–10.3)
Creatinine, Ser: 1.39 mg/dL — ABNORMAL HIGH (ref 0.61–1.24)
GFR calc Af Amer: 54 mL/min — ABNORMAL LOW (ref >60)
GFR calc non Af Amer: 47 mL/min — ABNORMAL LOW (ref >60)
Glucose, Bld: 188 mg/dL — ABNORMAL HIGH (ref 65–99)
POTASSIUM: 4.5 mmol/L (ref 3.5–5.1)
SODIUM: 139 mmol/L (ref 135–145)

## 2016-06-13 LAB — HIV ANTIBODY (ROUTINE TESTING W REFLEX): HIV Screen 4th Generation wRfx: NONREACTIVE

## 2016-06-13 MED ORDER — CEFEPIME HCL 1 G IJ SOLR
1.0000 g | INTRAMUSCULAR | Status: DC
  Administered 2016-06-14: 1 g via INTRAVENOUS
  Filled 2016-06-13: qty 1

## 2016-06-13 NOTE — Progress Notes (Signed)
Patient ambulatory entire length of unit with RN and daughter.  O2 sats maintained 90-97% on room air.

## 2016-06-13 NOTE — Plan of Care (Signed)
Problem: Education: Goal: Ability to demonstrate managment of disease process will improve Outcome: Progressing Heart failure booklet reviewed with patient

## 2016-06-13 NOTE — Progress Notes (Addendum)
PROGRESS NOTE                                                                                                                                                                                                             Patient Demographics:    Jeremy Patton, is a 76 y.o. male, DOB - 07/12/1940, YFV:[id number]  Admit date - 06/12/2016   Admitting Physician Truett Mainland, DO  Outpatient Primary MD for the patient is Asencion Noble, MD  LOS - 1  Outpatient Specialists:None  Chief Complaint  Patient presents with  . Shortness of Breath       Brief Narrative  76 year old male with history of diastolic CHF, diabetes mellitus, PAD, hypertension, hyperlipidemia with recent hospitalization at [facility] with pneumonia superimposed on acute CHF exacerbation (discharged on 4/19 on Levaquin) return to the ED lead worsened symptoms associated with productive cough with pink sputum and increased shortness of breath.  In the ED patient was in acute hypoxic respiratory failure (O2 sat 76% on room air) requiring BiPAP with chest x-ray showing worsening diffuse bilateral interstitial opacities and worsened multifocal consolidative opacity in the right upper lobe. He had mildly elevated BNP and troponin. Admitted to telemetry.   Subjective:    Patient informs his breathing to be much better   Assessment  & Plan :    Principal Problem:   Acute respiratory failure with hypoxia ([place]) Secondary to acute on chronic diastolic CHF and possible persistent multifocal pneumonia. Continue diuresis with IV Lasix. Strict I/O and daily weight. Currently on 2 L via nasal cannula and symptoms improving. Will assess for home O2 needs. Continue empiric antibiotics.  Active Problems: Essential hypertension Stable on home medications.    type 2 diabetes mellitus Monitor on sliding scale coverage. Hold metformin.    GERD (gastroesophageal reflux  disease) Continue PPI  Hyperlipidemia Continue statin.  Acute kidney injury ([place]) Mild secondary to diuresis. Hold ACE inhibitor and metformin. Monitor in a.m. labs.     Code Status : Full code  Family Communication  : None at bedside  Disposition Plan  : Home tomorrow if symptoms continues to improve  Barriers For Discharge : Active symptoms  Consults  :  None  Procedures  : None  DVT Prophylaxis  :  Lovenox -   Lab Results  Component Value Date   PLT  324 06/12/2016    Antibiotics  :    Anti-infectives    Start     Dose/Rate Route Frequency Ordered Stop   06/14/16 0600  ceFEPIme (MAXIPIME) 1 g in dextrose 5 % 50 mL IVPB     1 g 100 mL/hr over 30 Minutes Intravenous Every 24 hours 06/13/16 1104     06/12/16 2000  vancomycin (VANCOCIN) IVPB 750 mg/150 ml premix     750 mg 150 mL/hr over 60 Minutes Intravenous Every 12 hours 06/12/16 0713     06/12/16 1500  ceFEPIme (MAXIPIME) 2 g in dextrose 5 % 50 mL IVPB  Status:  Discontinued     2 g 100 mL/hr over 30 Minutes Intravenous Every 8 hours 06/12/16 0713 06/12/16 1208   06/12/16 1500  ceFEPIme (MAXIPIME) 1 g in dextrose 5 % 50 mL IVPB  Status:  Discontinued     1 g 100 mL/hr over 30 Minutes Intravenous Every 8 hours 06/12/16 1208 06/13/16 1104   06/12/16 0715  ceFEPIme (MAXIPIME) 2 g in dextrose 5 % 50 mL IVPB     2 g 100 mL/hr over 30 Minutes Intravenous  Once 06/12/16 0706 06/12/16 0808   06/12/16 0715  vancomycin (VANCOCIN) IVPB 1000 mg/200 mL premix     1,000 mg 200 mL/hr over 60 Minutes Intravenous  Once 06/12/16 0706 06/12/16 1039        Objective:   Vitals:   06/12/16 1956 06/13/16 0637 06/13/16 0926 06/13/16 1257  BP: (!) 106/54 (!) 103/50 (!) 109/45 (!) 117/56  Pulse: 93 76 76 84  Resp: 20   20  Temp: 99.1 F (37.3 C) 98.5 F (36.9 C)  97.7 F (36.5 C)  TempSrc: Oral Oral  Oral  SpO2: 94% 92% 96% 97%  Weight:  83.7 kg (184 lb 9.6 oz)    Height:        Wt Readings from Last 3 Encounters:    06/13/16 83.7 kg (184 lb 9.6 oz)  06/10/16 86.2 kg (190 lb)  07/10/15 84.4 kg (186 lb)     Intake/Output Summary (Last 24 hours) at 06/13/16 1334 Last data filed at 06/13/16 0913  Gross per 24 hour  Intake             1013 ml  Output             1825 ml  Net             -812 ml     Physical Exam  Gen: not in distress HEENT:  moist mucosa, supple neck Chest: Catheter rhonchi bilaterally CVS: N S1&S2, no murmurs,  GI: soft, NT, ND,  Musculoskeletal: warm, no edema     Data Review:    CBC  Recent Labs Lab 06/08/16 1737 06/09/16 0430 06/12/16 0634  WBC 10.1 7.2 8.1  HGB 12.1* 10.8* 11.4*  HCT 36.2* 32.1* 35.6*  PLT 318 270 324  MCV 87.0 86.5 86.6  MCH 29.1 29.1 27.7  MCHC 33.4 33.6 32.0  RDW 14.9 14.9 14.7  LYMPHSABS 2.0  --   --   MONOABS 1.0  --   --   EOSABS 0.2  --   --   BASOSABS 0.0  --   --     Chemistries   Recent Labs Lab 06/08/16 1737 06/09/16 0430 06/10/16 0509 06/12/16 0634 06/13/16 0449  NA 141 139 139 137 139  K 3.7 3.7 3.5 4.0 4.5  CL 107 104 104 104 106  CO2 25 26 27  21*  25  GLUCOSE 121* 113* 109* 211* 188*  BUN 16 20 22* 22* 21*  CREATININE 1.09 1.04 1.15 1.29* 1.39*  CALCIUM 9.0 8.5* 8.3* 8.6* 8.5*  AST 18  --   --   --   --   ALT 18  --   --   --   --   ALKPHOS 62  --   --   --   --   BILITOT 0.3  --   --   --   --    ------------------------------------------------------------------------------------------------------------------ No results for input(s): CHOL, HDL, LDLCALC, TRIG, CHOLHDL, LDLDIRECT in the last 72 hours.  Lab Results  Component Value Date   HGBA1C 9.1 (H) 05/05/2015   ------------------------------------------------------------------------------------------------------------------ No results for input(s): TSH, T4TOTAL, T3FREE, THYROIDAB in the last 72 hours.  Invalid input(s): FREET3 ------------------------------------------------------------------------------------------------------------------ No  results for input(s): VITAMINB12, FOLATE, FERRITIN, TIBC, IRON, RETICCTPCT in the last 72 hours.  Coagulation profile  Recent Labs Lab 06/08/16 1725  INR 0.98    No results for input(s): DDIMER in the last 72 hours.  Cardiac Enzymes  Recent Labs Lab 06/09/16 0430 06/09/16 1012 06/10/16 0509  TROPONINI 0.11* 0.11* 0.10*   ------------------------------------------------------------------------------------------------------------------    Component Value Date/Time   BNP 392.3 (H) 06/12/2016 0744    Inpatient Medications  Scheduled Meds: . aspirin EC  81 mg Oral Daily  . enoxaparin (LOVENOX) injection  40 mg Subcutaneous Q24H  . furosemide  40 mg Intravenous BID  . gabapentin  300 mg Oral BID  . insulin aspart  0-15 Units Subcutaneous TID WC  . insulin aspart  3 Units Subcutaneous QAC breakfast  . insulin glargine  30 Units Subcutaneous Daily  . pantoprazole  40 mg Oral Daily  . potassium chloride  40 mEq Oral BID  . rosuvastatin  20 mg Oral Daily  . sodium chloride flush  3 mL Intravenous Q12H  . tamsulosin  0.4 mg Oral Daily  . verapamil  180 mg Oral BID   Continuous Infusions: . sodium chloride    . [START ON 06/14/2016] ceFEPime (MAXIPIME) IV    . vancomycin Stopped (06/13/16 0945)   PRN Meds:.sodium chloride, acetaminophen, ondansetron (ZOFRAN) IV, sodium chloride flush  Micro Results Recent Results (from the past 240 hour(s))  Blood Culture (routine x 2)     Status: None (Preliminary result)   Collection Time: 06/12/16  7:20 AM  Result Value Ref Range Status   Specimen Description BLOOD RIGHT ANTECUBITAL  Final   Special Requests   Final    BOTTLES DRAWN AEROBIC AND ANAEROBIC Blood Culture adequate volume   Culture NO GROWTH 1 DAY  Final   Report Status PENDING  Incomplete  Blood Culture (routine x 2)     Status: None (Preliminary result)   Collection Time: 06/12/16  7:23 AM  Result Value Ref Range Status   Specimen Description BLOOD LEFT ANTECUBITAL   Final   Special Requests   Final    BOTTLES DRAWN AEROBIC AND ANAEROBIC Blood Culture adequate volume   Culture NO GROWTH 1 DAY  Final   Report Status PENDING  Incomplete  Culture, sputum-assessment     Status: None   Collection Time: 06/12/16  2:30 PM  Result Value Ref Range Status   Specimen Description SPUTUM  Final   Special Requests Normal  Final   Sputum evaluation THIS SPECIMEN IS ACCEPTABLE FOR SPUTUM CULTURE  Final   Report Status 06/12/2016 FINAL  Final  Culture, respiratory (NON-Expectorated)     Status: None (Preliminary result)  Collection Time: 06/12/16  2:30 PM  Result Value Ref Range Status   Specimen Description SPUTUM  Final   Special Requests Normal Reflexed from [id number]  Final   Gram Stain   Final    MODERATE WBC PRESENT,BOTH PMN AND MONONUCLEAR FEW SQUAMOUS EPITHELIAL CELLS PRESENT RARE GRAM NEGATIVE RODS RARE GRAM POSITIVE COCCI IN PAIRS    Culture NO GROWTH < 24 HOURS  Final   Report Status PENDING  Incomplete    Radiology Reports Dg Chest 2 View  Result Date: 06/08/2016 CLINICAL DATA:  Subacute onset of shortness of breath on exertion and cough. Initial encounter. EXAM: CHEST  2 VIEW COMPARISON:  Chest radiograph performed 11/03/2015 FINDINGS: The lungs are well-aerated. Vascular congestion is noted. Patchy right midlung airspace opacity raises concern for pneumonia. Increased interstitial markings raise question for superimposed interstitial edema. Small bilateral pleural effusions are suggested. There is no evidence of pneumothorax. The heart is normal in size; the mediastinal contour is within normal limits. No acute osseous abnormalities are seen. IMPRESSION: Vascular congestion. Patchy right midlung airspace opacity raises concern for pneumonia. Increased interstitial markings raise question for superimposed interstitial edema. Small bilateral pleural effusions suggested. Electronically Signed   By: Garald Balding M.D.   On: 06/08/2016 18:54   Dg Chest Port  1 View  Result Date: 06/12/2016 CLINICAL DATA:  Patient with history of pulmonary edema. On Lasix. Further evaluation. EXAM: PORTABLE CHEST 1 VIEW COMPARISON:  Chest radiograph 06/08/2016 FINDINGS: Monitoring leads overlie the patient. Stable cardiac and mediastinal contours. Interval worsening diffuse bilateral interstitial pulmonary opacities. Additionally interval increase in more focal consolidation within the right upper lung. Small bilateral pleural effusions. No pneumothorax. IMPRESSION: Interval worsening diffuse bilateral interstitial opacities most compatible with worsening pulmonary edema. Worsening more focal consolidative opacity right upper lung which may represent superimposed pneumonia. Followup PA and lateral chest X-ray is recommended in 3-4 weeks following trial of antibiotic therapy to ensure resolution and exclude underlying malignancy. Small pleural effusions. Electronically Signed   By: Lovey Newcomer M.D.   On: 06/12/2016 08:19    Time Spent in minutes  25   Louellen Molder M.D on 06/13/2016 at 1:34 PM  Between 7am to 7pm - Pager - [phone]  After 7pm go to [email or web address] - password [facility]  [facility] -  Office  [phone]

## 2016-06-13 NOTE — Plan of Care (Signed)
Problem: Health Behavior/Discharge Planning: Goal: Ability to manage health-related needs will improve Outcome: Progressing Living with heart failure booklet given to patient and RN educated on daily weights, zone tool and low sodium foods.    Problem: Tissue Perfusion: Goal: Risk factors for ineffective tissue perfusion will decrease Outcome: Progressing Able to wean off oxygen maintaining sats in 90's on room air   Problem: Activity: Goal: Risk for activity intolerance will decrease Outcome: Progressing States he has no shortness of breath when walking in hallway

## 2016-06-13 NOTE — Progress Notes (Signed)
Pharmacy Antibiotic Note  Jeremy Patton is a 76 y.o. male admitted on 06/12/2016 with pneumonia.  Pharmacy has been consulted for Vancomycin and Cefepime dosing. Pt just discharged from [facility] 4/19 after treatment for CHF exacerbation vs PNA - given Levaquin for PNA treatment. -SCr 1.29 >> 1.39, CrCl now dropped below 74mL/min at 46.6 mL/min  Plan: Change cefepime to 1gm IV q24h Continue ancomycin 1gm now then 750mg  IV q12h Will f/u micro data, renal function, and pt's clinical condition Vanc trough prn   Height: 5\' 11"  (180.3 cm) Weight: 184 lb 9.6 oz (83.7 kg) (a scale) IBW/kg (Calculated) : 75.3  Temp (24hrs), Avg:98.5 F (36.9 C), Min:98 F (36.7 C), Max:99.1 F (37.3 C)   Recent Labs Lab 06/08/16 1737 06/09/16 0430 06/10/16 0509 06/12/16 0634 06/12/16 0818 06/12/16 1111 06/13/16 0449  WBC 10.1 7.2  --  8.1  --   --   --   CREATININE 1.09 1.04 1.15 1.29*  --   --  1.39*  LATICACIDVEN  --   --   --   --  1.95* 1.25  --     Estimated Creatinine Clearance: 46.6 mL/min (A) (by C-G formula based on SCr of 1.39 mg/dL (H)).    Allergies  Allergen Reactions  . Protamine     hypotension    Antimicrobials this admission: 4/21 Vanc >>  4/21 Cefepime >>   Dose adjustments this admission: 4/22: Cefepime reduced to 1g Q24H given renal function  Microbiology results: 4/21 BCx x2: sent 4/21 sputum: rare gram- rods, rare gram+ cocci, few squamous cells  Thank you for allowing pharmacy to be a part of this patient's care.  Myer Peer Grayland Ormond), PharmD  PGY1 Pharmacy Resident Pager: [phone] 06/13/2016 11:05 AM

## 2016-06-14 DIAGNOSIS — I10 Essential (primary) hypertension: Secondary | ICD-10-CM

## 2016-06-14 DIAGNOSIS — E1159 Type 2 diabetes mellitus with other circulatory complications: Secondary | ICD-10-CM

## 2016-06-14 DIAGNOSIS — N179 Acute kidney failure, unspecified: Secondary | ICD-10-CM

## 2016-06-14 DIAGNOSIS — Z794 Long term (current) use of insulin: Secondary | ICD-10-CM

## 2016-06-14 LAB — BASIC METABOLIC PANEL
ANION GAP: 7 (ref 5–15)
BUN: 39 mg/dL — AB (ref 6–20)
CALCIUM: 8.4 mg/dL — AB (ref 8.9–10.3)
CO2: 22 mmol/L (ref 22–32)
CREATININE: 1.56 mg/dL — AB (ref 0.61–1.24)
Chloride: 104 mmol/L (ref 101–111)
GFR calc Af Amer: 47 mL/min — ABNORMAL LOW (ref >60)
GFR, EST NON AFRICAN AMERICAN: 41 mL/min — AB (ref >60)
GLUCOSE: 293 mg/dL — AB (ref 65–99)
Potassium: 5 mmol/L (ref 3.5–5.1)
Sodium: 133 mmol/L — ABNORMAL LOW (ref 135–145)

## 2016-06-14 LAB — GLUCOSE, CAPILLARY: Glucose-Capillary: 246 mg/dL — ABNORMAL HIGH (ref 65–99)

## 2016-06-14 MED ORDER — LANTUS SOLOSTAR 100 UNIT/ML ~~LOC~~ SOPN: 36.0000 [IU] | PEN_INJECTOR | SUBCUTANEOUS | 11 refills | Status: DC

## 2016-06-14 MED ORDER — GUAIFENESIN-DM 100-10 MG/5ML PO SYRP
5.0000 mL | ORAL_SOLUTION | ORAL | Status: DC | PRN
  Administered 2016-06-14: 5 mL via ORAL
  Filled 2016-06-14: qty 5

## 2016-06-14 MED ORDER — INSULIN ASPART 100 UNIT/ML FLEXPEN: 6.0000 [IU] | PEN_INJECTOR | Freq: Every morning | SUBCUTANEOUS | 11 refills | Status: DC

## 2016-06-14 MED ORDER — METFORMIN HCL 1000 MG PO TABS: 1000.0000 mg | ORAL_TABLET | Freq: Two times a day (BID) | ORAL | 0 refills | Status: DC

## 2016-06-14 MED ORDER — LISINOPRIL 20 MG PO TABS: 20.0000 mg | ORAL_TABLET | Freq: Every day | ORAL | 12 refills | Status: DC

## 2016-06-14 MED ORDER — LEVOFLOXACIN 750 MG PO TABS: 750.0000 mg | ORAL_TABLET | Freq: Every day | ORAL | 0 refills | Status: AC

## 2016-06-14 NOTE — Progress Notes (Signed)
Established Carotid Patient   History of Present Illness   Jeremy Patton is a 76 y.o. ([birth year]) male who presents with chief complaint: surveillance.  Prior procedures include: 1. L CEA (06/25/15) for L asx ICA stenosis >90%.   2. R CEA (05/06/15) for R asx ICA >90%  Previous carotid studies demonstrated: RICA >92% stenosis, LICA >42% stenosis.  Patient has no history of TIA or stroke symptom.  The patient has never had amaurosis fugax or monocular blindness.  The patient has never had facial drooping or hemiplegia.  The patient has never had receptive or expressive aphasia.    The patient's PMH, PSH, SH, and FamHx are unchanged from 07/10/15.  Current Outpatient Prescriptions  Medication Sig Dispense Refill  . acetaminophen (TYLENOL) 500 MG tablet Take 1,000 mg by mouth every morning.     Marland Kitchen aspirin 81 MG tablet Take 81 mg by mouth daily.    . furosemide (LASIX) 40 MG tablet Take 1 tablet (40 mg total) by mouth daily. 30 tablet 12  . gabapentin (NEURONTIN) 300 MG capsule Take 300 mg by mouth 2 (two) times daily.     . insulin aspart (NOVOLOG FLEXPEN) 100 UNIT/ML FlexPen Inject 6 Units into the skin every morning. 15 mL 11  . LANTUS SOLOSTAR 100 UNIT/ML Solostar Pen Inject 36 Units into the skin every morning. 15 mL 11  . levofloxacin (LEVAQUIN) 750 MG tablet Take 1 tablet (750 mg total) by mouth daily. 3 tablet 0  . [START ON 06/16/2016] lisinopril (PRINIVIL,ZESTRIL) 20 MG tablet Take 1 tablet (20 mg total) by mouth daily. 30 tablet 12  . pantoprazole (PROTONIX) 40 MG tablet Take 40 mg by mouth daily.     . potassium chloride SA (K-DUR,KLOR-CON) 20 MEQ tablet Take 1 tablet (20 mEq total) by mouth daily. 30 tablet 12  . rosuvastatin (CRESTOR) 20 MG tablet Take 1 tablet (20 mg total) by mouth daily. <PLEASE MAKE APPOINTMENT FOR REFILLS> (Patient taking differently: Take 20 mg by mouth daily. ) 90 tablet 0  . Tamsulosin HCl (FLOMAX) 0.4 MG CAPS Take 0.4 mg by mouth daily.     .  verapamil (CALAN-SR) 180 MG CR tablet Take 180 mg by mouth 2 (two) times daily.     No current facility-administered medications for this visit.    Facility-Administered Medications Ordered in Other Visits  Medication Dose Route Frequency Provider Last Rate Last Dose  . 0.9 %  sodium chloride infusion  250 mL Intravenous PRN Tanna Savoy Stinson, DO      . acetaminophen (TYLENOL) tablet 650 mg  650 mg Oral Q4H PRN Tanna Savoy Stinson, DO      . aspirin EC tablet 81 mg  81 mg Oral Daily Tanna Savoy Stinson, DO   81 mg at 06/14/16 6834  . ceFEPIme (MAXIPIME) 1 g in dextrose 5 % 50 mL IVPB  1 g Intravenous Q24H Haze Boyden, RPH 100 mL/hr at 06/14/16 0558 1 g at 06/14/16 0558  . enoxaparin (LOVENOX) injection 40 mg  40 mg Subcutaneous Q24H Tanna Savoy Stinson, DO   40 mg at 06/13/16 1241  . gabapentin (NEURONTIN) capsule 300 mg  300 mg Oral BID Tanna Savoy Stinson, DO   300 mg at 06/14/16 1962  . guaiFENesin-dextromethorphan (ROBITUSSIN DM) 100-10 MG/5ML syrup 5 mL  5 mL Oral Q4H PRN Nishant Dhungel, MD   5 mL at 06/14/16 0558  . insulin aspart (novoLOG) injection 0-15 Units  0-15 Units Subcutaneous TID WC Truett Mainland, DO  5 Units at 06/14/16 0751  . insulin aspart (novoLOG) injection 3 Units  3 Units Subcutaneous QAC breakfast Truett Mainland, DO   3 Units at 06/14/16 4854  . insulin glargine (LANTUS) injection 30 Units  30 Units Subcutaneous Daily Truett Mainland, DO   30 Units at 06/14/16 6270  . ondansetron (ZOFRAN) injection 4 mg  4 mg Intravenous Q6H PRN Tanna Savoy Stinson, DO      . pantoprazole (PROTONIX) EC tablet 40 mg  40 mg Oral Daily Tanna Savoy Stinson, DO   40 mg at 06/14/16 3500  . potassium chloride SA (K-DUR,KLOR-CON) CR tablet 40 mEq  40 mEq Oral BID Tanna Savoy Stinson, DO   40 mEq at 06/14/16 9381  . rosuvastatin (CRESTOR) tablet 20 mg  20 mg Oral Daily Tanna Savoy Stinson, DO   20 mg at 06/13/16 1710  . sodium chloride flush (NS) 0.9 % injection 3 mL  3 mL Intravenous Q12H Tanna Savoy Stinson, DO   3 mL at  06/13/16 2101  . sodium chloride flush (NS) 0.9 % injection 3 mL  3 mL Intravenous PRN Tanna Savoy Stinson, DO      . tamsulosin [facility]) capsule 0.4 mg  0.4 mg Oral Daily Tanna Savoy Stinson, DO   0.4 mg at 06/14/16 8299  . vancomycin (VANCOCIN) IVPB 750 mg/150 ml premix  750 mg Intravenous Q12H Franky Macho, [facility]   Stopped at 06/14/16 3716  . verapamil (CALAN-SR) CR tablet 180 mg  180 mg Oral BID Tanna Savoy Stinson, DO   180 mg at 06/14/16 9678    Allergies  Allergen Reactions  . Protamine     hypotension    On ROS today: no stroke sx, no smoking   Physical Examination   Vitals:   06/18/16 1500 06/18/16 1502  BP: (!) 112/56 (!) 106/56  Pulse: 85 88  Resp: 18   Temp: 97.1 F (36.2 C)   TempSrc: Oral   SpO2: 97%   Weight: 190 lb (86.2 kg)   Height: 5\' 11"  (1.803 m)     Body mass index is 26.5 kg/m.  General Alert, O x 3, WD, NAD  Neck Supple, mid-line trachea,  B neck inc well healed  Pulmonary Sym exp, good B air movt, CTA B  Cardiac RRR, Nl S1, S2, no Murmurs, No rubs, No S3,S4  Vascular Vessel Right Left  Radial Palpable Palpable  Brachial Palpable Palpable  Carotid Palpable, No Bruit Palpable, No Bruit  Aorta Not palpable N/A  Femoral Palpable Palpable  Popliteal Not palpable Not palpable  PT Palpable Palpable  DP Palpable Palpable    Gastrointestinal soft, non-distended, non-tender to palpation, No guarding or rebound, no HSM, no masses, no CVAT B, No palpable prominent aortic pulse,    Musculoskeletal M/S 5/5 throughout  , Extremities without ischemic changes  , No edema present,   Neurologic Cranial nerves 2-12 intact  , Pain and light touch intact in extremities  , Motor exam as listed above    Non-Invasive Vascular Imaging   Carotid Duplex (06/18/2016):   R ICA stenosis:  patent CEA without hyperplasia  R VA: patent and antegrade  L ICA stenosis:  patent CEA without hyperplasia   Increased velocity likely due to caliber change   L VA: patent and  antegrade   Medical Decision Making   Jeremy Patton is a 76 y.o. male who presents with: s/p B CEA for asx B ICA stenosis >90%   Based on the patient's vascular  studies and examination, I have offered the patient: annual B carotid duplex.  I discussed in depth with the patient the nature of atherosclerosis, and emphasized the importance of maximal medical management including strict control of blood pressure, blood glucose, and lipid levels, antiplatelet agents, obtaining regular exercise, and cessation of smoking.    The patient is aware that without maximal medical management the underlying atherosclerotic disease process will progress, limiting the benefit of any interventions. The patient is currently on a statin: Crestor.  The patient is currently on an anti-platelet: ASA.  Thank you for allowing [place] to participate in this patient's care.   Adele Barthel, MD, FACS [facility] Office: [phone] Pager: [phone]   VASCULAR QUALITY INITIATIVE FOLLOW UP DATA:  Current smoker: [  ] yes  [  x] no  Living status: [x ]  Home  [  ] Nursing home  [  ] Homeless    MEDS:  ASA [ x] yes  [  ] no- [  ] medical reason  [  ] non compliant  STATIN  [x  ] yes  [  ] no- [  ] medical reason  [  ] non compliant  Beta blocker [  ] yes  [ x ] no- [  ] medical reason  [  ] non compliant  ACE inhibitor [  ] yes  [x  ] no- [  ] medical reason  [  ] non compliant  P2Y12 Antagonist [ x ] none  [  ] clopidogrel-Plavix  [  ] ticlopidine-Ticlid   [  ] prasugrel-Effient  [  ] ticagrelor- Brilinta    Anticoagulant [x  ] None  [  ] warfarin  [  ] rivaroxaban-Xarelto [  ] dabigatran- Pradaxa  Neurologic event since D/C:  [  x] no  [  ] yes: [  ] eye event  [  ] cortical event  [  ] VB event  [  ] non specific event  [  ] right  [  ] left  [  ] TIA  [  ] stroke  Date:   Modified Rankin Score: 0  MI since D/C: [x  ] no  [  ] troponin only  [  ] EKG or  clinical  Cranial nerve injury: [ x ] none  [  ] resolved  [  ] persistent  Duplex CEA site: [  ] no  [  x] yes - PSV= 178  EDV= 14  ICA/CCA ratio: 0.38  Stenosis= [  x] <40% [  ] 40-59% [  ] 60-79%  [  ] > 80%  [  ]  Occluded  CEA site re-operation:  [ x ] no   [  ] yes- date of re-op:  CEA site PCI:   [ x ] no   [  ] yes- date of PCI:

## 2016-06-14 NOTE — Care Management Note (Signed)
Case Management Note  Patient Details  Name: RENJI BERWICK MRN: [id number] Date of Birth: September 16, 1940  Subjective/Objective:  Admitted with Acute Respiratory Failure                Action/Plan: Patient lives at home with his spouse; PCP: Asencion Noble, MD; has private insurance with [facility] with prescription drug coverage; CM will continue to follow for DCP  Expected Discharge Date:  06/14/16               Expected Discharge Plan:  Home/Self Care  Discharge planning Services  CM Consult  Status of Service:  In process, will continue to follow  Sherrilyn Rist [phone] 06/14/2016, 10:10 AM

## 2016-06-14 NOTE — Progress Notes (Signed)
Patient ambulated in hall. Oxygen saturations maintained above 94% on room air.

## 2016-06-14 NOTE — Progress Notes (Signed)
Pt has orders to be discharged. Discharge instructions given and pt has no additional questions at this time. Medication regimen reviewed and pt educated. Pt verbalized understanding and has no additional questions. Telemetry box removed. IVs removed and sites in good condition. Pt stable and waiting for transportation. 

## 2016-06-14 NOTE — Discharge Instructions (Signed)
Heart Failure °Heart failure is a condition in which the heart has trouble pumping blood because it has become weak or stiff. This means that the heart does not pump blood efficiently for the body to work well. For some people with heart failure, fluid may back up into the lungs and there may be swelling (edema) in the lower legs. Heart failure is usually a long-term (chronic) condition. It is important for you to take good care of yourself and follow the treatment plan from your health care provider. °What are the causes? °This condition is caused by some health problems, including: °· High blood pressure (hypertension). Hypertension causes the heart muscle to work harder than normal. High blood pressure eventually causes the heart to become stiff and weak. °· Coronary artery disease (CAD). CAD is the buildup of cholesterol and fat (plaques) in the arteries of the heart. °· Heart attack (myocardial infarction). Injured tissue, which is caused by the heart attack, does not contract as well and the heart's ability to pump blood is weakened. °· Abnormal heart valves. When the heart valves do not open and close properly, the heart muscle must pump harder to keep the blood flowing. °· Heart muscle disease (cardiomyopathy or myocarditis). Heart muscle disease is damage to the heart muscle from a variety of causes, such as drug or alcohol abuse, infections, or unknown causes. These can increase the risk of heart failure. °· Lung disease. When the lungs do not work properly, the heart must work harder. ° °What increases the risk? °Risk of heart failure increases as a person ages. This condition is also more likely to develop in people who: °· Are overweight. °· Are male. °· Smoke or chew tobacco. °· Abuse alcohol or illegal drugs. °· Have taken medicines that can damage the heart, such as chemotherapy drugs. °· Have diabetes. °? High blood sugar (glucose) is associated with high fat (lipid) levels in the blood. °? Diabetes  can also damage tiny blood vessels that carry nutrients to the heart muscle. °· Have abnormal heart rhythms. °· Have thyroid problems. °· Have low blood counts (anemia). ° °What are the signs or symptoms? °Symptoms of this condition include: °· Shortness of breath with activity, such as when climbing stairs. °· Persistent cough. °· Swelling of the feet, ankles, legs, or abdomen. °· Unexplained weight gain. °· Difficulty breathing when lying flat (orthopnea). °· Waking from sleep because of the need to sit up and get more air. °· Rapid heartbeat. °· Fatigue and loss of energy. °· Feeling light-headed, dizzy, or close to fainting. °· Loss of appetite. °· Nausea. °· Increased urination during the night (nocturia). °· Confusion. ° °How is this diagnosed? °This condition is diagnosed based on: °· Medical history, symptoms, and a physical exam. °· Diagnostic tests, which may include: °? Echocardiogram. °? Electrocardiogram (ECG). °? Chest X-ray. °? Blood tests. °? Exercise stress test. °? Radionuclide scans. °? Cardiac catheterization and angiogram. ° °How is this treated? °Treatment for this condition is aimed at managing the symptoms of heart failure. Medicines, behavioral changes, or other treatments may be necessary to treat heart failure. °Medicines °These may include: °· Angiotensin-converting enzyme (ACE) inhibitors. This type of medicine blocks the effects of a blood protein called angiotensin-converting enzyme. ACE inhibitors relax (dilate) the blood vessels and help to lower blood pressure. °· Angiotensin receptor blockers (ARBs). This type of medicine blocks the actions of a blood protein called angiotensin. ARBs dilate the blood vessels and help to lower blood pressure. °· Water   pills (diuretics). Diuretics cause the kidneys to remove salt and water from the blood. The extra fluid is removed through urination, leaving a lower volume of blood that the heart has to pump. °· Beta blockers. These improve heart  muscle strength and they prevent the heart from beating too quickly. °· Digoxin. This increases the force of the heartbeat. ° °Healthy behavior changes °These may include: °· Reaching and maintaining a healthy weight. °· Stopping smoking or chewing tobacco. °· Eating heart-healthy foods. °· Limiting or avoiding alcohol. °· Stopping use of street drugs (illegal drugs). °· Physical activity. ° °Other treatments °These may include: °· Surgery to open blocked coronary arteries or repair damaged heart valves. °· Placement of a biventricular pacemaker to improve heart muscle function (cardiac resynchronization therapy). This device paces both the right ventricle and left ventricle. °· Placement of a device to treat serious abnormal heart rhythms (implantable cardioverter defibrillator, or ICD). °· Placement of a device to improve the pumping ability of the heart (left ventricular assist device, or LVAD). °· Heart transplant. This can cure heart failure, and it is considered for certain patients who do not improve with other therapies. ° °Follow these instructions at home: °Medicines °· Take over-the-counter and prescription medicines only as told by your health care provider. Medicines are important in reducing the workload of your heart, slowing the progression of heart failure, and improving your symptoms. °? Do not stop taking your medicine unless your health care provider told you to do that. °? Do not skip any dose of medicine. °? Refill your prescriptions before you run out of medicine. You need your medicines every day. °Eating and drinking ° °· Eat heart-healthy foods. Talk with a dietitian to make an eating plan that is right for you. °? Choose foods that contain no trans fat and are low in saturated fat and cholesterol. Healthy choices include fresh or frozen fruits and vegetables, fish, lean meats, legumes, fat-free or low-fat dairy products, and whole-grain or high-fiber foods. °? Limit salt (sodium) if  directed by your health care provider. Sodium restriction may reduce symptoms of heart failure. Ask a dietitian to recommend heart-healthy seasonings. °? Use healthy cooking methods instead of frying. Healthy methods include roasting, grilling, broiling, baking, poaching, steaming, and stir-frying. °· Limit your fluid intake if directed by your health care provider. Fluid restriction may reduce symptoms of heart failure. °Lifestyle °· Stop smoking or using chewing tobacco. Nicotine and tobacco can damage your heart and your blood vessels. Do not use nicotine gum or patches before talking to your health care provider. °· Limit alcohol intake to no more than 1 drink per day for non-pregnant women and 2 drinks per day for men. One drink equals 12 oz of beer, 5 oz of wine, or 1½ oz of hard liquor. °? Drinking more than that is harmful to your heart. Tell your health care provider if you drink alcohol several times a week. °? Talk with your health care provider about whether any level of alcohol use is safe for you. °? If your heart has already been damaged by alcohol or you have severe heart failure, drinking alcohol should be stopped completely. °· Stop use of illegal drugs. °· Lose weight if directed by your health care provider. Weight loss may reduce symptoms of heart failure. °· Do moderate physical activity if directed by your health care provider. People who are elderly and people with severe heart failure should consult with a health care provider for physical activity recommendations. °  Monitor important information °· Weigh yourself every day. Keeping track of your weight daily helps you to notice excess fluid sooner. °? Weigh yourself every morning after you urinate and before you eat breakfast. °? Wear the same amount of clothing each time you weigh yourself. °? Record your daily weight. Provide your health care provider with your weight record. °· Monitor and record your blood pressure as told by your health  care provider. °· Check your pulse as told by your health care provider. °Dealing with extreme temperatures °· If the weather is extremely hot: °? Avoid vigorous physical activity. °? Use air conditioning or fans or seek a cooler location. °? Avoid caffeine and alcohol. °? Wear loose-fitting, lightweight, and light-colored clothing. °· If the weather is extremely cold: °? Avoid vigorous physical activity. °? Layer your clothes. °? Wear mittens or gloves, a hat, and a scarf when you go outside. °? Avoid alcohol. °General instructions °· Manage other health conditions such as hypertension, diabetes, thyroid disease, or abnormal heart rhythms as told by your health care provider. °· Learn to manage stress. If you need help to do this, ask your health care provider. °· Plan rest periods when fatigued. °· Get ongoing education and support as needed. °· Participate in or seek rehabilitation as needed to maintain or improve independence and quality of life. °· Stay up to date with immunizations. Keeping current on pneumococcal and influenza immunizations is especially important to prevent respiratory infections. °· Keep all follow-up visits as told by your health care provider. This is important. °Contact a health care provider if: °· You have a rapid weight gain. °· You have increasing shortness of breath that is unusual for you. °· You are unable to participate in your usual physical activities. °· You tire easily. °· You cough more than normal, especially with physical activity. °· You have any swelling or more swelling in areas such as your hands, feet, ankles, or abdomen. °· You are unable to sleep because it is hard to breathe. °· You feel like your heart is beating quickly (palpitations). °· You become dizzy or light-headed when you stand up. °Get help right away if: °· You have difficulty breathing. °· You notice or your family notices a change in your awareness, such as having trouble staying awake or having  difficulty with concentration. °· You have pain or discomfort in your chest. °· You have an episode of fainting (syncope). °This information is not intended to replace advice given to you by your health care provider. Make sure you discuss any questions you have with your health care provider. °Document Released: 02/08/2005 Document Revised: 10/14/2015 Document Reviewed: 09/03/2015 °[employer] Interactive Patient Education © 2017 [employer] ° °

## 2016-06-14 NOTE — Discharge Summary (Addendum)
Physician Discharge Summary  Jeremy Patton GYF:[id number] DOB: 02-16-1941 DOA: 06/12/2016  PCP: Asencion Noble, MD  Admit date: 06/12/2016 Discharge date: 06/14/2016  Admitted From: Home Disposition:  Home  Recommendations for Outpatient Follow-up:  1. Follow up with PCP in 1 week. Please monitor renal function during outpatient follow-up and if stable may resume his metformin. 2. Patient will complete total 5 daycourse of antibiotics on 4/26. Patient will need a follow-up chest x-ray in about 4 weeks to ensure resolution of his pneumonia. 3. Patient has appointment with cardiologist in [place] on 06/25/2016 at 1:30 p.m.  Home Health: None Equipment/Devices: None  Discharge Condition: Stable CODE STATUS: Full code Diet recommendation: Heart Healthy / Carb Modified      Discharge Diagnoses:  Principal Problem:   Acute respiratory failure with hypoxia (HCC)   Active Problems:   Acute on chronic diastolic CHF (congestive heart failure) (HCC)   GERD (gastroesophageal reflux disease)   Essential hypertension   Hyperlipidemia LDL goal <70   Type 2 diabetes mellitus with circulatory disorder, with long-term current use of insulin (HCC)   HCAP (healthcare-associated pneumonia)  Brief narrative/history of present illness 76 year old male with history of diastolic CHF, diabetes mellitus, PAD, hypertension, hyperlipidemia with recent hospitalization at [facility] with pneumonia superimposed on acute CHF exacerbation (discharged on 4/19 on Levaquin) return to the ED lead worsened symptoms associated with productive cough with pink sputum and increased shortness of breath.  In the ED patient was in acute hypoxic respiratory failure (O2 sat 76% on room air) requiring BiPAP with chest x-ray showing worsening diffuse bilateral interstitial opacities and worsened multifocal consolidative opacity in the right upper lobe. He had mildly elevated BNP and troponin. Admitted to  telemetry.   Hospital course   Acute respiratory failure with hypoxia ([place]) Secondary to acute on chronic diastolic CHF and possible persistent multifocal pneumonia. Diuresed well with IV Lasix. He is stable on room air and does not require home oxygen.. We will discharge him on his home Lasix dose (40 mg daily). Will prescribe Levaquin to complete 5 day course of antibiotic. Instructions given on salt restriction in diet, monitoring daily weight and medication adherence.  Active Problems:  Acute on chronic diastolic CHF As outlined above. Patient has established care with cardiology during recent hospitalization and has an appointment next week in [place]. Continue aspirin, statin and home dose Lasix. Resume lisinopril from tomorrow.  Multifocal pneumonia Discharged on Levaquin to complete 5 day course of antibiotics. Needs follow-up chest x-ray in about 4 weeks to ensure resolution.  Essential hypertension Stable on home medications.    type 2 diabetes mellitus Holding metformin. I have increased his Lantus and pre-meal aspart for adequate blood glucose elevated.    GERD (gastroesophageal reflux disease) Continue PPI  Hyperlipidemia Continue statin.  Acute kidney injury ([place]) secondary to diuresis. Held ACE inhibitor and metformin. Lisinopril can be resumed from tomorrow. Hold metformin until repeat labs checked as outpatient.      Family Communication  : None at bedside  Disposition Plan  : Home    Consults  :  None  Procedures  : None  Discharge Instructions   Allergies as of 06/14/2016      Reactions   Protamine    hypotension      Medication List    STOP taking these medications   metFORMIN 1000 MG tablet Commonly known as:  GLUCOPHAGE     TAKE these medications   acetaminophen 500 MG tablet Commonly known as:  TYLENOL Take  1,000 mg by mouth every morning.   aspirin 81 MG tablet Take 81 mg by mouth daily.   furosemide 40  MG tablet Commonly known as:  LASIX Take 1 tablet (40 mg total) by mouth daily.   gabapentin 300 MG capsule Commonly known as:  NEURONTIN Take 300 mg by mouth 2 (two) times daily.   insulin aspart 100 UNIT/ML FlexPen Commonly known as:  NOVOLOG FLEXPEN Inject 6 Units into the skin every morning. What changed:  how much to take   LANTUS SOLOSTAR 100 UNIT/ML Solostar Pen Generic drug:  Insulin Glargine Inject 36 Units into the skin every morning. What changed:  how much to take   levofloxacin 750 MG tablet Commonly known as:  LEVAQUIN Take 1 tablet (750 mg total) by mouth daily.   lisinopril 20 MG tablet Commonly known as:  PRINIVIL,ZESTRIL Take 1 tablet (20 mg total) by mouth daily. Start taking on:  06/16/2016   pantoprazole 40 MG tablet Commonly known as:  PROTONIX Take 40 mg by mouth daily.   potassium chloride SA 20 MEQ tablet Commonly known as:  K-DUR,KLOR-CON Take 1 tablet (20 mEq total) by mouth daily.   rosuvastatin 20 MG tablet Commonly known as:  CRESTOR Take 1 tablet (20 mg total) by mouth daily. <PLEASE MAKE APPOINTMENT FOR REFILLS> What changed:  additional instructions   tamsulosin 0.4 MG Caps capsule Commonly known as:  FLOMAX Take 0.4 mg by mouth daily.   verapamil 180 MG CR tablet Commonly known as:  CALAN-SR Take 180 mg by mouth 2 (two) times daily.      Follow-up Information    FAGAN,ROY, MD. Schedule an appointment as soon as possible for a visit in 1 week(s).   Specialty:  Internal Medicine Contact information: [address] [place] [address] [phone]        Erma Heritage, PA-C Follow up on 06/25/2016.   Specialties:  Physician Assistant, Cardiology Why:  1:30pm Contact information: [place] [address] [phone]          Allergies  Allergen Reactions  . Protamine     hypotension       Procedures/Studies: Dg Chest 2 View  Result Date: 06/08/2016 CLINICAL DATA:  Subacute onset  of shortness of breath on exertion and cough. Initial encounter. EXAM: CHEST  2 VIEW COMPARISON:  Chest radiograph performed 11/03/2015 FINDINGS: The lungs are well-aerated. Vascular congestion is noted. Patchy right midlung airspace opacity raises concern for pneumonia. Increased interstitial markings raise question for superimposed interstitial edema. Small bilateral pleural effusions are suggested. There is no evidence of pneumothorax. The heart is normal in size; the mediastinal contour is within normal limits. No acute osseous abnormalities are seen. IMPRESSION: Vascular congestion. Patchy right midlung airspace opacity raises concern for pneumonia. Increased interstitial markings raise question for superimposed interstitial edema. Small bilateral pleural effusions suggested. Electronically Signed   By: Garald Balding M.D.   On: 06/08/2016 18:54   Dg Chest Port 1 View  Result Date: 06/12/2016 CLINICAL DATA:  Patient with history of pulmonary edema. On Lasix. Further evaluation. EXAM: PORTABLE CHEST 1 VIEW COMPARISON:  Chest radiograph 06/08/2016 FINDINGS: Monitoring leads overlie the patient. Stable cardiac and mediastinal contours. Interval worsening diffuse bilateral interstitial pulmonary opacities. Additionally interval increase in more focal consolidation within the right upper lung. Small bilateral pleural effusions. No pneumothorax. IMPRESSION: Interval worsening diffuse bilateral interstitial opacities most compatible with worsening pulmonary edema. Worsening more focal consolidative opacity right upper lung which may represent superimposed  pneumonia. Followup PA and lateral chest X-ray is recommended in 3-4 weeks following trial of antibiotic therapy to ensure resolution and exclude underlying malignancy. Small pleural effusions. Electronically Signed   By: Lovey Newcomer M.D.   On: 06/12/2016 08:19       Subjective: reports breath to be much better.  Discharge Exam: Vitals:   06/13/16 1940  06/14/16 0605  BP: (!) 104/52 (!) 106/48  Pulse: 77 74  Resp: 18 18  Temp: 98.1 F (36.7 C) 98.6 F (37 C)   Vitals:   06/13/16 1544 06/13/16 1940 06/14/16 0113 06/14/16 0605  BP:  (!) 104/52  (!) 106/48  Pulse:  77  74  Resp:  18  18  Temp:  98.1 F (36.7 C)  98.6 F (37 C)  TempSrc:  Oral  Oral  SpO2: 94% 97% 96% 97%  Weight:    82.9 kg (182 lb 11.2 oz)  Height:        General: elderly male not in distress HEENT: Moist mucosa, supple neck, no JVD Chest: Improved breath sounds bilaterally CVS: S1 and S2 irregular, systolic murmur 3/6, GI: Soft, nondistended, nontender Musculoskeletal: Warm, no edema    The results of significant diagnostics from this hospitalization (including imaging, microbiology, ancillary and laboratory) are listed below for reference.     Microbiology: Recent Results (from the past 240 hour(s))  Blood Culture (routine x 2)     Status: None (Preliminary result)   Collection Time: 06/12/16  7:20 AM  Result Value Ref Range Status   Specimen Description BLOOD RIGHT ANTECUBITAL  Final   Special Requests   Final    BOTTLES DRAWN AEROBIC AND ANAEROBIC Blood Culture adequate volume   Culture NO GROWTH 1 DAY  Final   Report Status PENDING  Incomplete  Blood Culture (routine x 2)     Status: None (Preliminary result)   Collection Time: 06/12/16  7:23 AM  Result Value Ref Range Status   Specimen Description BLOOD LEFT ANTECUBITAL  Final   Special Requests   Final    BOTTLES DRAWN AEROBIC AND ANAEROBIC Blood Culture adequate volume   Culture NO GROWTH 1 DAY  Final   Report Status PENDING  Incomplete  Culture, sputum-assessment     Status: None   Collection Time: 06/12/16  2:30 PM  Result Value Ref Range Status   Specimen Description SPUTUM  Final   Special Requests Normal  Final   Sputum evaluation THIS SPECIMEN IS ACCEPTABLE FOR SPUTUM CULTURE  Final   Report Status 06/12/2016 FINAL  Final  Culture, respiratory (NON-Expectorated)     Status: None  (Preliminary result)   Collection Time: 06/12/16  2:30 PM  Result Value Ref Range Status   Specimen Description SPUTUM  Final   Special Requests Normal Reflexed from [id number]  Final   Gram Stain   Final    MODERATE WBC PRESENT,BOTH PMN AND MONONUCLEAR FEW SQUAMOUS EPITHELIAL CELLS PRESENT RARE GRAM NEGATIVE RODS RARE GRAM POSITIVE COCCI IN PAIRS    Culture NO GROWTH < 24 HOURS  Final   Report Status PENDING  Incomplete     Labs: BNP (last 3 results)  Recent Labs  06/08/16 1737 06/12/16 0744  BNP 433.0* 174.0*   Basic Metabolic Panel:  Recent Labs Lab 06/09/16 0430 06/10/16 0509 06/12/16 0634 06/13/16 0449 06/14/16 0505  NA 139 139 137 139 133*  K 3.7 3.5 4.0 4.5 5.0  CL 104 104 104 106 104  CO2 26 27 21* 25 22  GLUCOSE 113*  109* 211* 188* 293*  BUN 20 22* 22* 21* 39*  CREATININE 1.04 1.15 1.29* 1.39* 1.56*  CALCIUM 8.5* 8.3* 8.6* 8.5* 8.4*   Liver Function Tests:  Recent Labs Lab 06/08/16 1737  AST 18  ALT 18  ALKPHOS 62  BILITOT 0.3  PROT 6.9  ALBUMIN 3.2*   No results for input(s): LIPASE, AMYLASE in the last 168 hours. No results for input(s): AMMONIA in the last 168 hours. CBC:  Recent Labs Lab 06/08/16 1737 06/09/16 0430 06/12/16 0634  WBC 10.1 7.2 8.1  NEUTROABS 6.8  --   --   HGB 12.1* 10.8* 11.4*  HCT 36.2* 32.1* 35.6*  MCV 87.0 86.5 86.6  PLT 318 270 324   Cardiac Enzymes:  Recent Labs Lab 06/08/16 1737 06/08/16 2229 06/09/16 0430 06/09/16 1012 06/10/16 0509  TROPONINI 0.12* 0.12* 0.11* 0.11* 0.10*   BNP: Invalid input(s): POCBNP CBG:  Recent Labs Lab 06/13/16 0724 06/13/16 1206 06/13/16 1624 06/13/16 2135 06/14/16 0731  GLUCAP 177* 236* 210* 226* 246*   D-Dimer No results for input(s): DDIMER in the last 72 hours. Hgb A1c No results for input(s): HGBA1C in the last 72 hours. Lipid Profile No results for input(s): CHOL, HDL, LDLCALC, TRIG, CHOLHDL, LDLDIRECT in the last 72 hours. Thyroid function studies No  results for input(s): TSH, T4TOTAL, T3FREE, THYROIDAB in the last 72 hours.  Invalid input(s): FREET3 Anemia work up No results for input(s): VITAMINB12, FOLATE, FERRITIN, TIBC, IRON, RETICCTPCT in the last 72 hours. Urinalysis    Component Value Date/Time   COLORURINE COLORLESS (A) 06/12/2016 0904   APPEARANCEUR CLEAR 06/12/2016 0904   LABSPEC 1.006 06/12/2016 0904   PHURINE 5.0 06/12/2016 0904   GLUCOSEU >=500 (A) 06/12/2016 0904   HGBUR NEGATIVE 06/12/2016 0904   BILIRUBINUR NEGATIVE 06/12/2016 0904   KETONESUR NEGATIVE 06/12/2016 0904   PROTEINUR NEGATIVE 06/12/2016 0904   UROBILINOGEN 0.2 04/13/2009 1306   NITRITE NEGATIVE 06/12/2016 0904   LEUKOCYTESUR NEGATIVE 06/12/2016 0904   Sepsis Labs Invalid input(s): PROCALCITONIN,  WBC,  LACTICIDVEN Microbiology Recent Results (from the past 240 hour(s))  Blood Culture (routine x 2)     Status: None (Preliminary result)   Collection Time: 06/12/16  7:20 AM  Result Value Ref Range Status   Specimen Description BLOOD RIGHT ANTECUBITAL  Final   Special Requests   Final    BOTTLES DRAWN AEROBIC AND ANAEROBIC Blood Culture adequate volume   Culture NO GROWTH 1 DAY  Final   Report Status PENDING  Incomplete  Blood Culture (routine x 2)     Status: None (Preliminary result)   Collection Time: 06/12/16  7:23 AM  Result Value Ref Range Status   Specimen Description BLOOD LEFT ANTECUBITAL  Final   Special Requests   Final    BOTTLES DRAWN AEROBIC AND ANAEROBIC Blood Culture adequate volume   Culture NO GROWTH 1 DAY  Final   Report Status PENDING  Incomplete  Culture, sputum-assessment     Status: None   Collection Time: 06/12/16  2:30 PM  Result Value Ref Range Status   Specimen Description SPUTUM  Final   Special Requests Normal  Final   Sputum evaluation THIS SPECIMEN IS ACCEPTABLE FOR SPUTUM CULTURE  Final   Report Status 06/12/2016 FINAL  Final  Culture, respiratory (NON-Expectorated)     Status: None (Preliminary result)    Collection Time: 06/12/16  2:30 PM  Result Value Ref Range Status   Specimen Description SPUTUM  Final   Special Requests Normal Reflexed from [id number]  Final   Gram Stain   Final    MODERATE WBC PRESENT,BOTH PMN AND MONONUCLEAR FEW SQUAMOUS EPITHELIAL CELLS PRESENT RARE GRAM NEGATIVE RODS RARE GRAM POSITIVE COCCI IN PAIRS    Culture NO GROWTH < 24 HOURS  Final   Report Status PENDING  Incomplete     Time coordinating discharge: Over 30 minutes  SIGNED:   Louellen Molder, MD  [facility] 06/14/2016, 8:58 AM Pager   If 7PM-7AM, please contact night-coverage [email or web address] Password [email or web address]

## 2016-06-14 NOTE — Progress Notes (Signed)
Pt is awake and short of breath with 02 sat 965 sitting on  Side of bed. Ask for oxygen. Feeling stuffy. On 1 l for comfort.

## 2016-06-14 NOTE — Progress Notes (Signed)
Inpatient Diabetes Program Recommendations  AACE/ADA: New Consensus Statement on Inpatient Glycemic Control (2015)  Target Ranges:  Prepandial:   less than 140 mg/dL      Peak postprandial:   less than 180 mg/dL (1-2 hours)      Critically ill patients:  140 - 180 mg/dL   Lab Results  Component Value Date   GLUCAP 246 (H) 06/14/2016   HGBA1C 9.1 (H) 05/05/2015    Review of Glycemic Control Results for NIXXON, FARIA (MRN [id number]) as of 06/14/2016 10:06  Ref. Range 06/13/2016 07:24 06/13/2016 12:06 06/13/2016 16:24 06/13/2016 21:35 06/14/2016 07:31  Glucose-Capillary Latest Ref Range: 65 - 99 mg/dL 177 (H) 236 (H) 210 (H) 226 (H) 246 (H)   Diabetes history: DM2 Outpatient Diabetes medications: Lantus 30 units qd + Novolog 3 units ac breakfast + Metformin 1 gm bid Current orders for Inpatient glycemic control: Lantus 30 units + Nov 3 units ac breakfast + Novolog correction 0-15 units tid  Inpatient Diabetes Program Recommendations:  Please consider Novolog correction to increase to Tid if eats 50%. CBGs show post prandial hyperglycemia. Will follow.  Thank you, Nani Gasser. Sanela Evola, RN, MSN, CDE  Diabetes Coordinator Inpatient Glycemic Control Team Team Pager [phone] (8am-5pm) 06/14/2016 10:24 AM

## 2016-06-15 LAB — CULTURE, RESPIRATORY W GRAM STAIN: Special Requests: NORMAL

## 2016-06-17 LAB — CULTURE, BLOOD (ROUTINE X 2)
Culture: NO GROWTH
Culture: NO GROWTH
SPECIAL REQUESTS: ADEQUATE
Special Requests: ADEQUATE

## 2016-06-18 ENCOUNTER — Ambulatory Visit (HOSPITAL_COMMUNITY)
Admission: RE | Admit: 2016-06-18 | Discharge: 2016-06-18 | Disposition: A | Discharge status: Home / Self Care | Payer: Medicare Other | Source: Ambulatory Visit | Attending: Vascular Surgery | Admitting: Vascular Surgery

## 2016-06-18 ENCOUNTER — Encounter: Payer: Self-pay | Admitting: Vascular Surgery

## 2016-06-18 ENCOUNTER — Ambulatory Visit (INDEPENDENT_AMBULATORY_CARE_PROVIDER_SITE_OTHER): Payer: Medicare Other | Admitting: Vascular Surgery

## 2016-06-18 VITALS — BP 106/56 | HR 88 | Temp 97.1°F | Resp 18 | Ht 71.0 in | Wt 190.0 lb

## 2016-06-18 DIAGNOSIS — I779 Disorder of arteries and arterioles, unspecified: Secondary | ICD-10-CM

## 2016-06-18 DIAGNOSIS — I739 Peripheral vascular disease, unspecified: Principal | ICD-10-CM

## 2016-06-18 LAB — VAS US CAROTID
LEFT ECA DIAS: 9 cm/s
LEFT VERTEBRAL DIAS: 14 cm/s
LICADDIAS: -18 cm/s
LICAPSYS: -178 cm/s
Left CCA dist dias: -15 cm/s
Left CCA dist sys: -85 cm/s
Left CCA prox dias: 24 cm/s
Left CCA prox sys: 173 cm/s
Left ICA dist sys: -66 cm/s
Left ICA prox dias: -14 cm/s
RCCADSYS: -97 cm/s
RCCAPDIAS: -9 cm/s
RIGHT CCA MID DIAS: 11 cm/s
RIGHT VERTEBRAL DIAS: 15 cm/s
Right CCA prox sys: -45 cm/s

## 2016-06-21 NOTE — Addendum Note (Signed)
Addended by: Lianne Cure A on: 06/21/2016 09:28 AM   Modules accepted: Orders

## 2016-06-24 ENCOUNTER — Encounter: Payer: Self-pay | Admitting: Student

## 2016-06-24 NOTE — Progress Notes (Signed)
Cardiology Office Note    Date:  06/25/2016   ID:  Jeremy Patton, DOB 12-Feb-1941, MRN [id number]  PCP:  Asencion Noble, MD  Cardiologist:  Dr. Claiborne Billings   Chief Complaint  Patient presents with  . Hospitalization Follow-up    History of Present Illness:    Jeremy Patton is a 76 y.o. male with past medical history of HTN, HLD, GERD, Type 2 DM and carotid artery stenosis (s/p R CEA in 04/2015, L CEA in 06/2015) who presents to the office today for hospital follow-up.   He was recently admitted to [facility] from 4/17 - 4/19 for acute hypoxic respiratory failure in the setting of CHF and PNA. He was started on IV Lasix along with Levaquin for likely PNA. A repeat echocardiogram was obtained and showed a preserved EF of 55-60% with possible inferior hypokinesis and mild to moderate MR. He was switched to Lasix 40mg  daily at time of discharge with a weight of 190 lbs at that time.   He presented back to [facility] on 4/21 with a productive cough and pink-tinged sputum. He initially required BiPAP but his respiratory status improved following administration of IV Lasix and broad-spectrum antibiotics. Was discharged on 4/23 with Levaquin and Lasix 40mg  daily. Weight at 182(?) lbs. Weight was at 190 lbs on 4/27.  In talking with the patient today, he reports doing well since his recent hospitalization. He denies any recurrent episodes of dyspnea on exertion, orthopnea, PND, or lower extremity edema. No chest discomfort or palpitations. Weights have been stable between 183 lbs - 184 lbs on his home scales.   He has experienced lightheadedness over the past 2 months and questions whether this is due to his low blood pressure. He has not checked his blood pressure at home but reports it has been soft when checked at his various office visits.   Past Medical History:  Diagnosis Date  . Carotid artery disease ([place])    a. s/p R CEA in 04/2015 b. s/p L CEA in 06/2015  . CHF (congestive heart  failure) ([place])   . Diabetes mellitus without complication ([place]) 24/03/3534  . GERD (gastroesophageal reflux disease)   . Hyperlipidemia 11/29/2011  . Hypertension 11/29/2011  . Low back pain 11/29/2011  . Lumbar radiculopathy 11/29/2011   Left  . Pain of left lower leg   . Wears glasses     Past Surgical History:  Procedure Laterality Date  . CATARACT EXTRACTION W/ INTRAOCULAR LENS  IMPLANT, BILATERAL    . CIRCUMCISION     Age 37  . COLONOSCOPY  08/22/2005 and  07/31/2009   minimal internal hemorrhoids, left-sided diverticulosis  . COLONOSCOPY N/A 11/25/2014   Procedure: COLONOSCOPY;  Surgeon: Daneil Dolin, MD;  [facility];  Service: Endoscopy;  Laterality: N/A;  1030  . ENDARTERECTOMY Right 05/06/2015   Procedure: Right Carotid ENDARTERECTOMY ;  Surgeon: Conrad Aurora, MD;  Location: [place];  Service: Vascular;  Laterality: Right;  . ENDARTERECTOMY Left 06/25/2015   Procedure: LEFT CAROTID ENDARTERECTOMY;  Surgeon: Conrad Clearmont, MD;  Location: [place];  Service: Vascular;  Laterality: Left;  . ESOPHAGEAL DILATION N/A 11/25/2014   Procedure: ESOPHAGEAL DILATION;  Surgeon: Daneil Dolin, MD;  [facility];  Service: Endoscopy;  Laterality: N/A;  . ESOPHAGOGASTRODUODENOSCOPY N/A 11/25/2014   Procedure: ESOPHAGOGASTRODUODENOSCOPY (EGD);  Surgeon: Daneil Dolin, MD;  [facility];  Service: Endoscopy;  Laterality: N/A;  . EYE SURGERY    .  MULTIPLE TOOTH EXTRACTIONS    . PATCH ANGIOPLASTY Right 05/06/2015   Procedure: PATCH ANGIOPLASTY;  Surgeon: Conrad Ulmer, MD;  Location: [place];  Service: Vascular;  Laterality: Right;  . PATCH ANGIOPLASTY Left 06/25/2015   Procedure: PATCH ANGIOPLASTY USING XENOSURE BIOLOGIC PATCH 1cm X 6cm ;  Surgeon: Conrad Lavallette, MD;  Location: [place];  Service: Vascular;  Laterality: Left;  . TONSILLECTOMY      Current Medications: Outpatient Medications Prior to Visit  Medication Sig Dispense Refill  . acetaminophen (TYLENOL) 500 MG tablet  Take 1,000 mg by mouth every morning.     Marland Kitchen aspirin 81 MG tablet Take 81 mg by mouth daily.    . furosemide (LASIX) 40 MG tablet Take 1 tablet (40 mg total) by mouth daily. 30 tablet 12  . gabapentin (NEURONTIN) 300 MG capsule Take 300 mg by mouth 2 (two) times daily.     . insulin aspart (NOVOLOG FLEXPEN) 100 UNIT/ML FlexPen Inject 6 Units into the skin every morning. 15 mL 11  . LANTUS SOLOSTAR 100 UNIT/ML Solostar Pen Inject 36 Units into the skin every morning. 15 mL 11  . pantoprazole (PROTONIX) 40 MG tablet Take 40 mg by mouth daily.     . potassium chloride SA (K-DUR,KLOR-CON) 20 MEQ tablet Take 1 tablet (20 mEq total) by mouth daily. 30 tablet 12  . rosuvastatin (CRESTOR) 20 MG tablet Take 1 tablet (20 mg total) by mouth daily. <PLEASE MAKE APPOINTMENT FOR REFILLS> (Patient taking differently: Take 20 mg by mouth daily. ) 90 tablet 0  . Tamsulosin HCl (FLOMAX) 0.4 MG CAPS Take 0.4 mg by mouth daily.     . verapamil (CALAN-SR) 180 MG CR tablet Take 180 mg by mouth 2 (two) times daily.    Marland Kitchen lisinopril (PRINIVIL,ZESTRIL) 20 MG tablet Take 1 tablet (20 mg total) by mouth daily. 30 tablet 12   No facility-administered medications prior to visit.      Allergies:   Protamine   Social History   Social History  . Marital status: Married    Spouse name: N/A  . Number of children: N/A  . Years of education: N/A   Social History Main Topics  . Smoking status: Former Smoker    Packs/day: 2.00    Years: 30.00    Types: Cigarettes    Quit date: 12/17/1991  . Smokeless tobacco: Never Used  . Alcohol use No  . Drug use: No  . Sexual activity: Not Asked   Other Topics Concern  . None   Social History Narrative  . None     Family History:  The patient's family history includes Colon cancer in his brother; Heart disease in his mother; Stroke in his mother.   Review of Systems:   Please see the history of present illness.     General:  No chills, fever, night sweats or weight  changes.  Cardiovascular:  No chest pain, dyspnea on exertion, edema, orthopnea, palpitations, paroxysmal nocturnal dyspnea. Dermatological: No rash, lesions/masses Respiratory: No cough, dyspnea Urologic: No hematuria, dysuria Abdominal:   No nausea, vomiting, diarrhea, bright red blood per rectum, melena, or hematemesis.  Neurologic:  No visual changes, wkns, changes in mental status. Positive for lightheadedness and dizziness.   All other systems reviewed and are otherwise negative except as noted above.   Physical Exam:    VS:  BP (!) 104/58   Pulse 78   Ht 5\' 11"  (1.803 m)   Wt 192 lb (87.1 kg)  SpO2 94%   BMI 26.78 kg/m    General: Well developed, well nourished [place] American male appearing in no acute distress. Head: Normocephalic, atraumatic, sclera non-icteric, no xanthomas, nares are without discharge.  Neck: No carotid bruits. JVD not elevated.  Lungs: Respirations regular and unlabored, without wheezes or rales.  Heart: Regular rate and rhythm. No S3 or S4.  No murmur, no rubs, or gallops appreciated. Abdomen: Soft, non-tender, non-distended with normoactive bowel sounds. No hepatomegaly. No rebound/guarding. No obvious abdominal masses. Msk:  Strength and tone appear normal for age. No joint deformities or effusions. Extremities: No clubbing or cyanosis. No lower extremity edema.  Distal pedal pulses are 2+ bilaterally. Neuro: Alert and oriented X 3. Moves all extremities spontaneously. No focal deficits noted. Psych:  Responds to questions appropriately with a normal affect. Skin: No rashes or lesions noted  Wt Readings from Last 3 Encounters:  06/25/16 192 lb (87.1 kg)  06/18/16 190 lb (86.2 kg)  06/14/16 182 lb 11.2 oz (82.9 kg)     Studies/Labs Reviewed:   EKG:  EKG is not ordered today.    Recent Labs: 06/08/2016: ALT 18; TSH 0.857 06/12/2016: B Natriuretic Peptide 392.3; Hemoglobin 11.4; Platelets 324 06/14/2016: BUN 39; Creatinine, Ser 1.56; Potassium  5.0; Sodium 133   Lipid Panel    Component Value Date/Time   CHOL 163 04/30/2015 0813   TRIG 55 04/30/2015 0813   HDL 63 04/30/2015 0813   LDLCALC 89 04/30/2015 0813    Additional studies/ records that were reviewed today include:   Echocardiogram: 05/2016 Study Conclusions  - Left ventricle: The cavity size was normal. Wall thickness was   normal. Systolic function was normal. The estimated ejection   fraction was in the range of 55% to 60%. Possible inferior   hypokinesis. Diastolic function is abnormal, indeterminate grade.   There is evidence of elevated LA pressure. - Aortic valve: Mildly calcified annulus. Trileaflet; moderately   thickened leaflets. Valve area (VTI): 2.1 cm^2. Valve area   (Vmax): 2.04 cm^2. Valve area (Vmean): 2.1 cm^2. - Mitral valve: Mildly to moderately calcified annulus. Moderately   thickened leaflets . There was mild to moderate regurgitation. MR   is eccentric and posterior, may be underestimated. - Left atrium: The atrium was mildly to moderately dilated. - Pulmonary veins: There is systolic blunting consistent with   elevated LA pressure. - Atrial septum: No defect or patent foramen ovale was identified. - Inferior vena cava: The vessel was dilated. The respirophasic   diameter changes were blunted (< 50%), consistent with elevated   central venous pressure.   NST: 04/2015  The left ventricular ejection fraction is normal (55-65%).  Nuclear stress EF: 56%.  There was no ST segment deviation noted during stress.  No T wave inversion was noted during stress.  Defect 1: There is a large defect of moderate severity present in the basal inferior, basal inferolateral, mid inferior, mid inferolateral, apical inferior and apical lateral location. This is likely diaphragmatic attenuation given the normal wall motion. However, cannot rule out prior infarct. There is no ischemia.  This is a low risk study.  Assessment:    1. Chronic  diastolic heart failure (HCC)   2. Carotid stenosis, bilateral   3. Essential hypertension, benign   4. Hyperlipidemia LDL goal <70   5. CKD (chronic kidney disease) stage 3, GFR 30-59 ml/min      Plan:   In order of problems listed above:  1. Chronic Diastolic CHF - Echo in 62/2297  showed a preserved EF of 55-60%. - He does not appear volume overloaded by physical examination and denies any recent dyspnea on exertion, orthopnea, PND, or lower extremity edema. He is compliant with limiting his salt and fluid intake. - Will continue on Lasix 40 mg daily. He can take an additional tablet for weight gain of 3 pounds overnight or 5 pounds in one week. Creatinine recently checked by his PCP and stable at that time.  2. Carotid artery stenosis - s/p R CEA in 04/2015 and L CEA in 06/2015. - continue ASA and statin therapy.   3. HTN - He is actually borderline hypotensive at 104/58 during today's visit. Reports similar readings at other office visits.  - Continue Verapamil 180 mg twice daily. Will reduce Lisinopril from 20 mg daily to 10 mg daily in the setting of his lightheadedness and dizziness. I encouraged him to check his BP at home 2-3 times per week and report back with his readings.   4. HLD - followed by PCP. - remains on Crestor 20mg  daily.   5. Stage 3 CKD - creatinine checked by his PCP on 06/21/2016 and improved to 1.29.   Medication Adjustments/Labs and Tests Ordered: Current medicines are reviewed at length with the patient today.  Concerns regarding medicines are outlined above.  Medication changes, Labs and Tests ordered today are listed in the Patient Instructions below. Patient Instructions  Medication Instructions:  DECREASE LISINOPRIL TO 10 MG DAILY   Labwork: NONE  Testing/Procedures: NONE  Follow-Up: Your physician wants you to follow-up in: 6 MONTHS .  You will receive a reminder letter in the mail two months in advance. If you don't receive a letter,  please call our office to schedule the follow-up appointment.  Any Other Special Instructions Will Be Listed Below (If Applicable).  If you need a refill on your cardiac medications before your next appointment, please call your pharmacy.  Signed, Erma Heritage, PA-C  06/25/2016 5:17 PM    [facility] [place], [place], [place]  [address] Phone: [phone]; Fax: [phone]  [address] [place], [place] [address] Phone: [phone]

## 2016-06-25 ENCOUNTER — Encounter: Payer: Self-pay | Admitting: Student

## 2016-06-25 ENCOUNTER — Ambulatory Visit (INDEPENDENT_AMBULATORY_CARE_PROVIDER_SITE_OTHER): Payer: Medicare Other | Admitting: Student

## 2016-06-25 VITALS — BP 104/58 | HR 78 | Ht 71.0 in | Wt 192.0 lb

## 2016-06-25 DIAGNOSIS — I10 Essential (primary) hypertension: Secondary | ICD-10-CM | POA: Diagnosis not present

## 2016-06-25 DIAGNOSIS — N183 Chronic kidney disease, stage 3 unspecified: Secondary | ICD-10-CM

## 2016-06-25 DIAGNOSIS — E785 Hyperlipidemia, unspecified: Secondary | ICD-10-CM | POA: Diagnosis not present

## 2016-06-25 DIAGNOSIS — I5032 Chronic diastolic (congestive) heart failure: Secondary | ICD-10-CM | POA: Diagnosis not present

## 2016-06-25 DIAGNOSIS — I6523 Occlusion and stenosis of bilateral carotid arteries: Secondary | ICD-10-CM

## 2016-06-25 MED ORDER — LISINOPRIL 20 MG PO TABS: 10.0000 mg | ORAL_TABLET | Freq: Every day | ORAL | 12 refills | Status: DC

## 2016-06-25 NOTE — Patient Instructions (Signed)
Medication Instructions:  DECREASE LISINOPRIL TO 10 MG DAILY   Labwork: NONE  Testing/Procedures: NONE  Follow-Up: Your physician wants you to follow-up in: 6 MONTHS .  You will receive a reminder letter in the mail two months in advance. If you don't receive a letter, please call our office to schedule the follow-up appointment.   Any Other Special Instructions Will Be Listed Below (If Applicable).     If you need a refill on your cardiac medications before your next appointment, please call your pharmacy.

## 2016-06-26 ENCOUNTER — Other Ambulatory Visit: Payer: Self-pay | Admitting: Cardiovascular Disease

## 2016-07-21 ENCOUNTER — Ambulatory Visit (HOSPITAL_COMMUNITY)
Admission: RE | Admit: 2016-07-21 | Discharge: 2016-07-21 | Disposition: A | Discharge status: Home / Self Care | Payer: Medicare Other | Source: Ambulatory Visit | Attending: Internal Medicine | Admitting: Internal Medicine

## 2016-07-21 ENCOUNTER — Telehealth: Payer: Self-pay | Admitting: Student

## 2016-07-21 ENCOUNTER — Other Ambulatory Visit (HOSPITAL_COMMUNITY): Payer: Self-pay | Admitting: Internal Medicine

## 2016-07-21 DIAGNOSIS — Z09 Encounter for follow-up examination after completed treatment for conditions other than malignant neoplasm: Secondary | ICD-10-CM | POA: Diagnosis not present

## 2016-07-21 DIAGNOSIS — B59 Pneumocystosis: Secondary | ICD-10-CM

## 2016-07-21 DIAGNOSIS — Z8701 Personal history of pneumonia (recurrent): Secondary | ICD-10-CM | POA: Insufficient documentation

## 2016-07-21 DIAGNOSIS — J189 Pneumonia, unspecified organism: Secondary | ICD-10-CM | POA: Diagnosis present

## 2016-07-21 NOTE — Telephone Encounter (Signed)
Returned pt call. No answer- no voicemail, unable to leave message.

## 2016-07-21 NOTE — Telephone Encounter (Signed)
Per phone call--pt lvm stating he's still having low BP since the medication change would like someone to give him a call @ [phone]

## 2016-07-21 NOTE — Telephone Encounter (Signed)
Spoke with patient, he was calling to inform us that a little over a week ago, he was riding his bike and he got very dizzy and had blurred vision. He went inside and took his blood pressure and it was 89/48. He has not taken his lisinopril since that day and he wanted to report his blood pressures off of the medication: 120/70, 113/52, 115/53, 125/64, 128/68, 127/63 and 127/67. He states that since stopping the lisinopril he feels much better. He did say that he would keep track of it and if he notices an upward trend, he will restart medication and call to inform us. Will forward to [place], PA-C as an [place].

## 2016-07-21 NOTE — Telephone Encounter (Signed)
Yes, we had reduced his Lisinopril dosing at his office visit as BP was soft at that time. Agree with discontinuing it at this time as his current readings are within a normal range. Continue to monitor at home. Thanks!

## 2016-11-04 ENCOUNTER — Ambulatory Visit (INDEPENDENT_AMBULATORY_CARE_PROVIDER_SITE_OTHER): Payer: Medicare Other | Admitting: Physical Medicine and Rehabilitation

## 2016-11-04 ENCOUNTER — Ambulatory Visit (INDEPENDENT_AMBULATORY_CARE_PROVIDER_SITE_OTHER): Payer: Medicare Other

## 2016-11-04 ENCOUNTER — Encounter (INDEPENDENT_AMBULATORY_CARE_PROVIDER_SITE_OTHER): Payer: Self-pay | Admitting: Physical Medicine and Rehabilitation

## 2016-11-04 VITALS — BP 136/62 | HR 90

## 2016-11-04 DIAGNOSIS — M48062 Spinal stenosis, lumbar region with neurogenic claudication: Secondary | ICD-10-CM

## 2016-11-04 DIAGNOSIS — M545 Low back pain: Secondary | ICD-10-CM

## 2016-11-04 DIAGNOSIS — M47816 Spondylosis without myelopathy or radiculopathy, lumbar region: Secondary | ICD-10-CM

## 2016-11-04 DIAGNOSIS — G8929 Other chronic pain: Secondary | ICD-10-CM | POA: Diagnosis not present

## 2016-11-04 NOTE — Progress Notes (Signed)
Jeremy Patton - 76 y.o. male MRN [id number]  Date of birth: 1940-12-16  Office Visit Note: Visit Date: 11/04/2016 PCP: Jeremy Noble, MD Referred by: Jeremy Noble, MD  Subjective: Chief Complaint  Patient presents with  . Lower Back - Pain   HPI: Jeremy Patton is a very pleasant 76 year old gentleman who used to be followed by Jeremy Patton or interventional spine treatment. He had asked to have her notes sent to [place]. We have not received those as of yet. The patient reports chronic long-term history of back pain and some hip and leg pain. Today he is reporting predominantly right-sided lower back and buttock pain. The pain radiates down the thigh to a degree at L5 and S1 distribution in the hamstring. He reports walking every day and somewhat painful when he starts to walk but then the more he walks he feels better in the pain lessens. He denies any numbness tingling or paresthesia. He does have pain in the feet and some numbness in the feet due to peripheral neuropathy. His case is complicated by insulin-dependent diabetes as well as coronary artery disease and congestive heart failure. He reports no prior lumbar surgery. He denies any specific injury or focal weakness. He's had no bowel or bladder changes. He's had no fever chills or night sweats.  He has not had any recent lumbar spine imaging. He doesn't MRI from 2011 which is reviewed below. This did show facet arthropathy at L4-5 with left more than right lateral recess narrowing. There was disc herniation small. He has been using Tylenol and gabapentin for pain relief and this is not helping recently. He reports several months of this worsening right hip and leg pain.  With Jeremy Patton he is receiving 1 or 2 injections about every 6 months of good relief. He isn't sure exactly what was injected but it sounds like it was either a combination of transforaminal versus facet joint blocks just talking with him.    Review of Systems   Constitutional: Negative for chills, fever, malaise/fatigue and weight loss.  HENT: Negative for hearing loss and sinus pain.   Eyes: Negative for blurred vision, double vision and photophobia.  Respiratory: Negative for cough and shortness of breath.   Cardiovascular: Negative for chest pain, palpitations and leg swelling.  Gastrointestinal: Negative for abdominal pain, nausea and vomiting.  Genitourinary: Negative for flank pain.  Musculoskeletal: Positive for back pain and joint pain. Negative for myalgias.  Skin: Negative for itching and rash.  Neurological: Negative for tremors, focal weakness and weakness.  Endo/Heme/Allergies: Negative.   Psychiatric/Behavioral: Negative for depression.  All other systems reviewed and are negative.  Otherwise per HPI.  Assessment & Plan: Visit Diagnoses:  1. Chronic right-sided low back pain, with sciatica presence unspecified   2. Spondylosis without myelopathy or radiculopathy, lumbar region   3. Spinal stenosis of lumbar region with neurogenic claudication     Plan: Findings:  Chronic history of axial low back pain worse with going from sit to stand and standing and moving. He does get some referral pattern in the hip and leg. His back pain seems to be more a combination of mechanical facet pain and possibly related to lateral recess stenosis given the MRI from 2011. He did have x-rays today showing again facet arthropathy and by foraminal stenosis at L4-5 and L5-S1. His current pain I believe is more related to nerve root irritation on the right probably L4-L5. I think a transforaminal epidural steroid injection would be the  right choice in this regard. If he does well with that we would watch him and see how he does versus facet joint block. Depending on how he does in general we would look at updating his MRI at some point we discussed this. He has no red flag symptoms other than complicated case of coronary artery disease and diabetes. We will  still try to get records from Jeremy Patton. We'll have him sign a release today. We will have him come into the transforaminal injection to get that approved.    Meds & Orders: No orders of the defined types were placed in this encounter.   Orders Placed This Encounter  Procedures  . XR Lumbar Spine Complete    Follow-up: Return for Right L4 and L5 transforaminal epidural steroid injection..   Procedures: No procedures performed  No notes on file   Clinical History: MRI LUMBAR SPINE WITHOUT CONTRAST 04/14/2009   Technique:  Multiplanar and multiecho pulse sequences of the lumbar spine were obtained without intravenous contrast.   Comparison: Plain films lumbar spine 04/13/2009   Findings: Vertebral body height and alignment are maintained. Discogenic marrow signal change is most notable L4-5.  The conus medullaris is normal in signal and position.  Imaged intra- abdominal contents are unremarkable.   The T10-11, T11-12 and T12-L1 levels are imaged in the sagittal plane only and negative.   L1-2:  Negative.   L2-3:  Disc desiccation.  Otherwise negative.   L3-4:  Mild disc bulging and facet degenerative disease.  Central canal and foramina are mildly narrowed.   L4-5:  The patient has a disc bulge eccentrically prominent to the left.  Central canal is mildly narrowed with narrowing of both lateral recesses, worse on the left, which could impact either descending L5 root.  Moderate to moderately severe left foraminal narrowing is present.  Right foramen is mildly narrowed.   L5-S1:  Right worse than left facet degenerative disease and a shallow central disc protrusion is seen.  Central canal and foramina remain open.   IMPRESSION:   1.  Spondylosis which is most notable L4-5 where an eccentric to the left disc bulge causes left worse than right lateral recess and foraminal narrowing.  Central canal is mildly narrowed overall at this level. 2.  Mild central  canal narrowing L3-4  He reports that he quit smoking about 24 years ago. His smoking use included Cigarettes. He has a 60.00 pack-year smoking history. He has never used smokeless tobacco. No results for input(s): HGBA1C, LABURIC in the last 8760 hours.  Objective:  VS:  HT:    WT:   BMI:     BP:136/62  HR:90bpm  TEMP: ( )  RESP:  Physical Exam  Constitutional: He is oriented to person, place, and time. He appears well-developed and well-nourished. No distress.  HENT:  Head: Normocephalic and atraumatic.  Nose: Nose normal.  Mouth/Throat: Oropharynx is clear and moist.  Eyes: Pupils are equal, round, and reactive to light. Conjunctivae are normal.  Neck: Normal range of motion. Neck supple.  Cardiovascular: Regular rhythm and intact distal pulses.   Pulmonary/Chest: Effort normal and breath sounds normal.  Abdominal: Soft. He exhibits no distension.  Musculoskeletal: He exhibits no deformity.  Patient ambulates without aid. He is somewhat slow to rise from a seated position. He has a little bit of balance difficulty. His pain with extension rotation of the lumbar spine more to the right. Has no pain over the greater trochanters. He has no pain with  hip rotation. He does have stiffness of both hips. He has good distal strength without clonus.  Neurological: He is alert and oriented to person, place, and time.  Skin: Skin is warm. No rash noted.  Psychiatric: He has a normal mood and affect. His behavior is normal.  Nursing note and vitals reviewed.   Ortho Exam Imaging: No results found.  Past Medical/Family/Surgical/Social History: Medications & Allergies reviewed per EMR Patient Active Problem List   Diagnosis Date Noted  . AKI (acute kidney injury) ([place])   . Acute on chronic diastolic CHF (congestive heart failure) ([place]) 06/12/2016  . HCAP (healthcare-associated pneumonia) 06/12/2016  . CHF exacerbation ([place]) 06/08/2016  . Acute respiratory failure with hypoxia ([place])  06/08/2016  . Left carotid artery stenosis 06/25/2015  . Preoperative clearance 05/05/2015  . Carotid artery disease ([place]) 05/01/2015  . Dizziness 05/01/2015  . Essential hypertension 05/01/2015  . Hyperlipidemia LDL goal <70 05/01/2015  . Type 2 diabetes mellitus with circulatory disorder, with long-term current use of insulin ([place]) 05/01/2015  . History of colonic polyps   . Diverticulosis of colon without hemorrhage   . Reflux esophagitis   . Mucosal abnormality of stomach   . GERD (gastroesophageal reflux disease) 11/01/2014  . Esophageal dysphagia 11/01/2014  . FH: colon cancer in first degree relative <2 years old 11/01/2014   Past Medical History:  Diagnosis Date  . Carotid artery disease ([place])    a. s/p R CEA in 04/2015 b. s/p L CEA in 06/2015  . CHF (congestive heart failure) ([place])   . Diabetes mellitus without complication ([place]) 24/06/8097  . GERD (gastroesophageal reflux disease)   . Hyperlipidemia 11/29/2011  . Hypertension 11/29/2011  . Low back pain 11/29/2011  . Lumbar radiculopathy 11/29/2011   Left  . Pain of left lower leg   . Wears glasses    Family History  Problem Relation Age of Onset  . Stroke Mother   . Heart disease Mother        before age 66  . Colon cancer Brother        Age 35   Past Surgical History:  Procedure Laterality Date  . CATARACT EXTRACTION W/ INTRAOCULAR LENS  IMPLANT, BILATERAL    . CIRCUMCISION     Age 46  . COLONOSCOPY  08/22/2005 and  07/31/2009   minimal internal hemorrhoids, left-sided diverticulosis  . COLONOSCOPY N/A 11/25/2014   Procedure: COLONOSCOPY;  Surgeon: Daneil Dolin, MD;  [facility];  Service: Endoscopy;  Laterality: N/A;  1030  . ENDARTERECTOMY Right 05/06/2015   Procedure: Right Carotid ENDARTERECTOMY ;  Surgeon: Conrad Claysville, MD;  Location: [place];  Service: Vascular;  Laterality: Right;  . ENDARTERECTOMY Left 06/25/2015   Procedure: LEFT CAROTID ENDARTERECTOMY;  Surgeon: Conrad Enetai, MD;  Location: [place];  Service: Vascular;  Laterality: Left;  . ESOPHAGEAL DILATION N/A 11/25/2014   Procedure: ESOPHAGEAL DILATION;  Surgeon: Daneil Dolin, MD;  [facility];  Service: Endoscopy;  Laterality: N/A;  . ESOPHAGOGASTRODUODENOSCOPY N/A 11/25/2014   Procedure: ESOPHAGOGASTRODUODENOSCOPY (EGD);  Surgeon: Daneil Dolin, MD;  [facility];  Service: Endoscopy;  Laterality: N/A;  . EYE SURGERY    . MULTIPLE TOOTH EXTRACTIONS    . PATCH ANGIOPLASTY Right 05/06/2015   Procedure: PATCH ANGIOPLASTY;  Surgeon: Conrad Warren, MD;  Location: [place];  Service: Vascular;  Laterality: Right;  . PATCH ANGIOPLASTY Left 06/25/2015   Procedure: PATCH ANGIOPLASTY USING XENOSURE BIOLOGIC PATCH 1cm X  6cm ;  Surgeon: Conrad Greenlee, MD;  Location: [place];  Service: Vascular;  Laterality: Left;  . TONSILLECTOMY     Social History   Occupational History  . Not on file.   Social History Main Topics  . Smoking status: Former Smoker    Packs/day: 2.00    Years: 30.00    Types: Cigarettes    Quit date: 12/17/1991  . Smokeless tobacco: Never Used  . Alcohol use No  . Drug use: No  . Sexual activity: Not on file

## 2016-11-04 NOTE — Progress Notes (Deleted)
Right sided lower back and buttock pain. Radiates down right thigh. Walks every day- painful when he starts, but pain lessens the more he walks. No numbness or tingling.

## 2016-11-08 ENCOUNTER — Encounter (INDEPENDENT_AMBULATORY_CARE_PROVIDER_SITE_OTHER): Payer: Self-pay | Admitting: Physical Medicine and Rehabilitation

## 2016-11-11 ENCOUNTER — Encounter (INDEPENDENT_AMBULATORY_CARE_PROVIDER_SITE_OTHER): Payer: Self-pay | Admitting: Physical Medicine and Rehabilitation

## 2016-11-11 ENCOUNTER — Ambulatory Visit (INDEPENDENT_AMBULATORY_CARE_PROVIDER_SITE_OTHER): Payer: Medicare Other | Admitting: Physical Medicine and Rehabilitation

## 2016-11-11 ENCOUNTER — Ambulatory Visit (INDEPENDENT_AMBULATORY_CARE_PROVIDER_SITE_OTHER): Payer: Medicare Other

## 2016-11-11 VITALS — BP 142/68 | HR 91

## 2016-11-11 DIAGNOSIS — M48062 Spinal stenosis, lumbar region with neurogenic claudication: Secondary | ICD-10-CM | POA: Diagnosis not present

## 2016-11-11 DIAGNOSIS — G8929 Other chronic pain: Secondary | ICD-10-CM | POA: Diagnosis not present

## 2016-11-11 DIAGNOSIS — M545 Low back pain: Secondary | ICD-10-CM | POA: Diagnosis not present

## 2016-11-11 MED ORDER — BETAMETHASONE SOD PHOS & ACET 6 (3-3) MG/ML IJ SUSP
12.0000 mg | Freq: Once | INTRAMUSCULAR | Status: AC
  Administered 2016-11-11: 12 mg

## 2016-11-11 MED ORDER — LIDOCAINE HCL (PF) 1 % IJ SOLN
2.0000 mL | Freq: Once | INTRAMUSCULAR | Status: AC
Start: 2016-11-11 — End: 2016-11-11
  Administered 2016-11-11: 2 mL

## 2016-11-11 NOTE — Progress Notes (Deleted)
Patient is here for planned right L4 & L5 transforaminal injection. No change in symptoms.

## 2016-11-11 NOTE — Patient Instructions (Signed)

## 2016-11-18 NOTE — Procedures (Signed)
Jeremy Patton is a 76 year old gentleman who returns today for planned right L4-L5 transforaminal epidural steroid injection. We did obtain notes from Dr. Niel Hummer that does show that she completed the same injection as well as facet joint blocks and even an L5 and S1 transforaminal injection with good relief. We discussed this with him briefly. Within a complete the injection today diagnostically and hopefully therapeutically. Please see our prior evaluation and management note for further details and justification. Lumbosacral Transforaminal Epidural Steroid Injection - Sub-Pedicular Approach with Fluoroscopic Guidance  Patient: Jeremy Patton      Date of Birth: [birth year] MRN: [id number] PCP: Asencion Noble, MD      Visit Date: 11/11/2016   Universal Protocol:    Date/Time: 11/11/2016  Consent Given By: the patient  Position: PRONE  Additional Comments: Vital signs were monitored before and after the procedure. Patient was prepped and draped in the usual sterile fashion. The correct patient, procedure, and site was verified.   Injection Procedure Details:  Procedure Site One Meds Administered:  Meds ordered this encounter  Medications  . lidocaine (PF) (XYLOCAINE) 1 % injection 2 mL  . betamethasone acetate-betamethasone sodium phosphate (CELESTONE) injection 12 mg    Laterality: Right  Location/Site:  L4-L5 L5-S1  Needle size: 22 G  Needle type: Spinal  Needle Placement: Transforaminal  Findings:  -Contrast Used: 1 mL iohexol 180 mg iodine/mL   -Comments: Excellent flow of contrast along the nerve and into the epidural space.  Procedure Details: After squaring off the end-plates to get a true AP view, the C-arm was positioned so that an oblique view of the foramen as noted above was visualized. The target area is just inferior to the "nose of the scotty dog" or sub pedicular. The soft tissues overlying this structure were infiltrated with 2-3 ml. of 1%  Lidocaine without Epinephrine.  The spinal needle was inserted toward the target using a "trajectory" view along the fluoroscope beam.  Under AP and lateral visualization, the needle was advanced so it did not puncture dura and was located close the 6 O'Clock position of the pedical in AP tracterory. Biplanar projections were used to confirm position. Aspiration was confirmed to be negative for CSF and/or blood. A 1-2 ml. volume of Isovue-250 was injected and flow of contrast was noted at each level. Radiographs were obtained for documentation purposes.   After attaining the desired flow of contrast documented above, a 0.5 to 1.0 ml test dose of 0.25% Marcaine was injected into each respective transforaminal space.  The patient was observed for 90 seconds post injection.  After no sensory deficits were reported, and normal lower extremity motor function was noted,   the above injectate was administered so that equal amounts of the injectate were placed at each foramen (level) into the transforaminal epidural space.   Additional Comments:  The patient tolerated the procedure well Dressing: Band-Aid    Post-procedure details: Patient was observed during the procedure. Post-procedure instructions were reviewed.  Patient left the clinic in stable condition.

## 2016-12-09 ENCOUNTER — Ambulatory Visit (INDEPENDENT_AMBULATORY_CARE_PROVIDER_SITE_OTHER): Payer: Medicare Other | Admitting: Ophthalmology

## 2017-03-31 ENCOUNTER — Encounter (INDEPENDENT_AMBULATORY_CARE_PROVIDER_SITE_OTHER): Payer: Medicare Other | Admitting: Ophthalmology

## 2017-03-31 DIAGNOSIS — E103593 Type 1 diabetes mellitus with proliferative diabetic retinopathy without macular edema, bilateral: Secondary | ICD-10-CM | POA: Diagnosis not present

## 2017-03-31 DIAGNOSIS — H35033 Hypertensive retinopathy, bilateral: Secondary | ICD-10-CM | POA: Diagnosis not present

## 2017-03-31 DIAGNOSIS — E10319 Type 1 diabetes mellitus with unspecified diabetic retinopathy without macular edema: Secondary | ICD-10-CM

## 2017-03-31 DIAGNOSIS — I10 Essential (primary) hypertension: Secondary | ICD-10-CM | POA: Diagnosis not present

## 2017-03-31 DIAGNOSIS — H43813 Vitreous degeneration, bilateral: Secondary | ICD-10-CM

## 2017-05-18 DIAGNOSIS — M545 Low back pain, unspecified: Secondary | ICD-10-CM | POA: Insufficient documentation

## 2017-06-02 DIAGNOSIS — M51369 Other intervertebral disc degeneration, lumbar region without mention of lumbar back pain or lower extremity pain: Secondary | ICD-10-CM

## 2017-06-02 DIAGNOSIS — M5136 Other intervertebral disc degeneration, lumbar region: Secondary | ICD-10-CM

## 2017-06-02 HISTORY — DX: Other intervertebral disc degeneration, lumbar region: M51.36

## 2017-06-02 HISTORY — DX: Other intervertebral disc degeneration, lumbar region without mention of lumbar back pain or lower extremity pain: M51.369

## 2017-06-18 NOTE — Progress Notes (Signed)
Established Carotid Patient   History of Present Illness   Jeremy Patton is a 77 y.o. (11-15-1940) male who presents with chief complaint: no complaints.  Prior procedures include: 1. L CEA (06/25/15) for L asx ICA stenosis >90% 2. R CEA (05/06/15) for R asx ICA stenosis >90%  Patient has no TIA or CVA sx since his last visit.  Patient has No history of TIA or stroke symptom.  The patient has never had amaurosis fugax or monocular blindness.  The patient has never had facial drooping or hemiplegia.  The patient has never  had receptive or expressive aphasia.    The patient's PMH, PSH, SH, and FamHx were reviewed on 06/18/16 are unchanged from 06/22/17.  Current Outpatient Medications  Medication Sig Dispense Refill  . acetaminophen (TYLENOL) 500 MG tablet Take 1,000 mg by mouth every morning.     Marland Kitchen aspirin 81 MG tablet Take 81 mg by mouth daily.    . furosemide (LASIX) 40 MG tablet Take 1 tablet (40 mg total) by mouth daily. 30 tablet 12  . gabapentin (NEURONTIN) 300 MG capsule Take 300 mg by mouth 2 (two) times daily.     . insulin aspart (NOVOLOG FLEXPEN) 100 UNIT/ML FlexPen Inject 6 Units into the skin every morning. 15 mL 11  . LANTUS SOLOSTAR 100 UNIT/ML Solostar Pen Inject 36 Units into the skin every morning. 15 mL 11  . lisinopril (PRINIVIL,ZESTRIL) 20 MG tablet Take 0.5 tablets (10 mg total) by mouth daily. (Patient not taking: Reported on 11/04/2016) 30 tablet 12  . metFORMIN (GLUCOPHAGE) 1000 MG tablet Take 1,000 mg by mouth 2 (two) times daily with a meal.    . pantoprazole (PROTONIX) 40 MG tablet Take 40 mg by mouth daily.     . potassium chloride SA (K-DUR,KLOR-CON) 20 MEQ tablet Take 1 tablet (20 mEq total) by mouth daily. 30 tablet 12  . rosuvastatin (CRESTOR) 20 MG tablet Take 1 tablet (20 mg total) by mouth daily. Needs appointment for further refills. 2nd notice 30 tablet 0  . Tamsulosin HCl (FLOMAX) 0.4 MG CAPS Take 0.4 mg by mouth daily.     . verapamil  (CALAN-SR) 180 MG CR tablet Take 180 mg by mouth 2 (two) times daily.     No current facility-administered medications for this visit.     On ROS today: no CVA or TIA sx, no intermittent claudication   Physical Examination   Vitals:   06/22/17 0928 06/22/17 0933  BP: (!) 150/67 (!) 144/63  Pulse: 80 80  Resp: 18   Temp: (!) 97 F (36.1 C)   TempSrc: Oral   SpO2: 99%   Weight: 183 lb (83 kg)   Height: 5\' 7"  (1.702 m)    Body mass index is 28.66 kg/m.  General Alert, O x 3, WD, NAD  Neck Supple, mid-line trachea,    Pulmonary Sym exp, good B air movt, CTA B  Cardiac RRR, Nl S1, S2, no Murmurs, No rubs, No S3,S4  Vascular Vessel Right Left  Radial Palpable Palpable  Brachial Palpable Palpable  Carotid Palpable, No Bruit Palpable, No Bruit  Aorta Not palpable N/A  Femoral Palpable Palpable  Popliteal Not palpable Not palpable  PT Not palpable Faintly palpable  DP Faintly palpable Not palpable    Gastro- intestinal soft, non-distended, non-tender to palpation, No guarding or rebound, no HSM, no masses, no CVAT B, No palpable prominent aortic pulse,    Musculo- skeletal M/S 5/5 throughout  , Extremities without  ischemic changes    Neurologic Cranial nerves 2-12 intact , Pain and light touch intact in extremities , Motor exam as listed above    Non-Invasive Vascular Imaging   B Carotid Duplex (06/22/2017):   R ICA stenosis:  1-39%  R VA: patent and antegrade  L ICA stenosis:  1-39%  L VA: patent and antegrade   Medical Decision Making   Jeremy Patton is a 77 y.o. male who presents with: s/p B CEA for asx B ICA stenosis >90%.   Based on the patient's vascular studies and examination, I have offered the patient: q2 year B carotid duplex.  I discussed in depth with the patient the nature of atherosclerosis, and emphasized the importance of maximal medical management including strict control of blood pressure, blood glucose, and lipid levels, antiplatelet  agents, obtaining regular exercise, and cessation of smoking.    The patient is aware that without maximal medical management the underlying atherosclerotic disease process will progress, limiting the benefit of any interventions.  The patient is currently on a statin: Crestor.   The patient is currently on an anti-platelet: ASA.  Thank you for allowing [place] to participate in this patient's care.   Adele Barthel, MD, FACS [facility] Office: [phone] Pager: [phone]

## 2017-06-22 ENCOUNTER — Ambulatory Visit (HOSPITAL_COMMUNITY)
Admission: RE | Admit: 2017-06-22 | Discharge: 2017-06-22 | Disposition: A | Discharge status: Home / Self Care | Payer: Medicare Other | Source: Ambulatory Visit | Attending: Vascular Surgery | Admitting: Vascular Surgery

## 2017-06-22 ENCOUNTER — Encounter: Payer: Self-pay | Admitting: Vascular Surgery

## 2017-06-22 ENCOUNTER — Ambulatory Visit (INDEPENDENT_AMBULATORY_CARE_PROVIDER_SITE_OTHER): Payer: Medicare Other | Admitting: Vascular Surgery

## 2017-06-22 VITALS — BP 144/63 | HR 80 | Temp 97.0°F | Resp 18 | Ht 67.0 in | Wt 183.0 lb

## 2017-06-22 DIAGNOSIS — I6523 Occlusion and stenosis of bilateral carotid arteries: Secondary | ICD-10-CM | POA: Diagnosis not present

## 2017-06-22 DIAGNOSIS — I739 Peripheral vascular disease, unspecified: Secondary | ICD-10-CM

## 2017-06-22 DIAGNOSIS — I779 Disorder of arteries and arterioles, unspecified: Secondary | ICD-10-CM | POA: Diagnosis present

## 2017-06-24 ENCOUNTER — Ambulatory Visit: Payer: Medicare Other | Admitting: Vascular Surgery

## 2017-06-24 ENCOUNTER — Encounter (HOSPITAL_COMMUNITY): Payer: Medicare Other

## 2017-08-17 ENCOUNTER — Encounter: Payer: Self-pay | Admitting: Nurse Practitioner

## 2017-08-17 ENCOUNTER — Ambulatory Visit: Payer: Medicare Other | Admitting: Nurse Practitioner

## 2017-08-17 DIAGNOSIS — R194 Change in bowel habit: Secondary | ICD-10-CM | POA: Insufficient documentation

## 2017-08-17 DIAGNOSIS — R197 Diarrhea, unspecified: Secondary | ICD-10-CM | POA: Diagnosis not present

## 2017-08-17 NOTE — Progress Notes (Signed)
Referring Provider: Asencion Noble, MD Primary Care Physician:  Asencion Noble, MD Primary GI:  Dr. Gala Romney  Chief Complaint  Patient presents with  . change in bowels    stools harder at times; doesn't have to wipe as much    HPI:   Jeremy Patton is a 78 y.o. male who presents for evaluation of change in bowel habits.  The patient is not been seen by our office since 11/01/2014 when she was seen for dysphasia, GERD, family history of colon cancer.  At his last visit it was noted that his brother died of colon cancer at age 20.  No polyps on recent colonoscopy in 2011.  Chronic GERD.  Recommended colonoscopy and EGD.  EGD was completed 11/25/2014 which found ulcerative reflux esophagitis, abnormal distal esophagus consistent with Barrett's status post Maloney dilation and biopsy, hiatal hernia, gastric and duodenal erosions status post biopsy.  Surgical pathology found the esophageal biopsies to be consistent with Barrett's and the gastric biopsies to be inflammation without H. pylori.  Recommend to begin Protonix 40 mg daily, repeat EGD in 1 year.  It does not appear a repeat endoscopy was completed.  Colonoscopy completed the same day found colonic diverticulosis, a single 6 mm pedunculated polyp in the mid descending segment status post removal, abnormal ileocecal valve likely not clinically significant status post biopsy, internal hemorrhoids and anal papilla.  Surgical pathology found the polyp to be either adenoma and the ileocecal valve biopsy to be benign colon mucosa with underlying lymphoid aggregate.  Recommended repeat colonoscopy in 5 years.  They he states he has a cold. Has had change in bowels. His stools were previously hard and required straining, he felt constipation. Now his bowels are not hard but more soft; "small terds" feels like he is emptying out, no straining, no constipation. Also previously used to leave a streak on the toilet tissue when he wiped and now he is not.  Denies abdominal pain, hematochezia, melena, fever, chills. This morning he had very soft, possibly diarrhea stools. Had 3 bowel movements so far today. No recent antibiotics, healthcare facility admission; is on PPI. He did get a cough from his wife, he has felt unwell since Sunday with cough, sneeze, runny nose. Denies chest pain, dyspnea, dizziness, lightheadedness, syncope, near syncope. Denies any other upper or lower GI symptoms.  Past Medical History:  Diagnosis Date  . Carotid artery disease ([place])    a. s/p R CEA in 04/2015 b. s/p L CEA in 06/2015  . CHF (congestive heart failure) ([place])   . Diabetes mellitus without complication ([place]) 88/06/275  . GERD (gastroesophageal reflux disease)   . Hyperlipidemia 11/29/2011  . Hypertension 11/29/2011  . Low back pain 11/29/2011  . Lumbar radiculopathy 11/29/2011   Left  . Pain of left lower leg   . Wears glasses     Past Surgical History:  Procedure Laterality Date  . CATARACT EXTRACTION W/ INTRAOCULAR LENS  IMPLANT, BILATERAL    . CIRCUMCISION     Age 29  . COLONOSCOPY  08/22/2005 and  07/31/2009   minimal internal hemorrhoids, left-sided diverticulosis  . COLONOSCOPY N/A 11/25/2014   Procedure: COLONOSCOPY;  Surgeon: Daneil Dolin, MD;  [facility];  Service: Endoscopy;  Laterality: N/A;  1030  . ENDARTERECTOMY Right 05/06/2015   Procedure: Right Carotid ENDARTERECTOMY ;  Surgeon: Conrad , MD;  Location: [place];  Service: Vascular;  Laterality: Right;  . ENDARTERECTOMY Left 06/25/2015   Procedure: LEFT  CAROTID ENDARTERECTOMY;  Surgeon: Conrad [place], MD;  Location: [place];  Service: Vascular;  Laterality: Left;  . ESOPHAGEAL DILATION N/A 11/25/2014   Procedure: ESOPHAGEAL DILATION;  Surgeon: Daneil Dolin, MD;  [facility];  Service: Endoscopy;  Laterality: N/A;  . ESOPHAGOGASTRODUODENOSCOPY N/A 11/25/2014   Procedure: ESOPHAGOGASTRODUODENOSCOPY (EGD);  Surgeon: Daneil Dolin, MD;  [facility];  Service:  Endoscopy;  Laterality: N/A;  . EYE SURGERY    . MULTIPLE TOOTH EXTRACTIONS    . PATCH ANGIOPLASTY Right 05/06/2015   Procedure: PATCH ANGIOPLASTY;  Surgeon: Conrad Ewing, MD;  Location: [place];  Service: Vascular;  Laterality: Right;  . PATCH ANGIOPLASTY Left 06/25/2015   Procedure: PATCH ANGIOPLASTY USING XENOSURE BIOLOGIC PATCH 1cm X 6cm ;  Surgeon: Conrad [place], MD;  Location: [place];  Service: Vascular;  Laterality: Left;  . TONSILLECTOMY      Current Outpatient Medications  Medication Sig Dispense Refill  . acetaminophen (TYLENOL) 500 MG tablet Take 1,000 mg by mouth 2 (two) times daily.     Marland Kitchen aspirin 81 MG tablet Take 81 mg by mouth daily.    . furosemide (LASIX) 40 MG tablet Take 1 tablet (40 mg total) by mouth daily. (Patient taking differently: Take 20-40 mg by mouth 2 (two) times daily. Takes 1/2 tablet in morning and 1 in the afternoon) 30 tablet 12  . insulin lispro (HUMALOG KWIKPEN) 100 UNIT/ML KiwkPen Inject 6 Units into the skin.    Marland Kitchen LANTUS SOLOSTAR 100 UNIT/ML Solostar Pen Inject 36 Units into the skin every morning. (Patient taking differently: Inject 30 Units into the skin every morning. ) 15 mL 11  . metFORMIN (GLUCOPHAGE) 1000 MG tablet Take 1,000 mg by mouth 2 (two) times daily with a meal.    . olmesartan (BENICAR) 20 MG tablet Take 1 tablet by mouth daily.    . pantoprazole (PROTONIX) 40 MG tablet Take 40 mg by mouth daily.     . pioglitazone (ACTOS) 15 MG tablet Take 15 mg by mouth daily.    . potassium chloride SA (K-DUR,KLOR-CON) 20 MEQ tablet Take 1 tablet (20 mEq total) by mouth daily. 30 tablet 12  . rosuvastatin (CRESTOR) 20 MG tablet Take 1 tablet (20 mg total) by mouth daily. Needs appointment for further refills. 2nd notice 30 tablet 0  . verapamil (CALAN-SR) 180 MG CR tablet Take 180 mg by mouth 2 (two) times daily.     No current facility-administered medications for this visit.     Allergies as of 08/17/2017 - Review Complete 08/17/2017  Allergen Reaction  Noted  . Protamine  06/08/2016    Family History  Problem Relation Age of Onset  . Stroke Mother   . Heart disease Mother        before age 28  . Colon cancer Brother        Age 41    Social History   Socioeconomic History  . Marital status: Married    Spouse name: Not on file  . Number of children: Not on file  . Years of education: Not on file  . Highest education level: Not on file  Occupational History  . Not on file  Social Needs  . Financial resource strain: Not on file  . Food insecurity:    Worry: Not on file    Inability: Not on file  . Transportation needs:    Medical: Not on file    Non-medical: Not on file  Tobacco Use  .  Smoking status: Former Smoker    Packs/day: 2.00    Years: 30.00    Pack years: 60.00    Types: Cigarettes    Last attempt to quit: 12/17/1991    Years since quitting: 25.6  . Smokeless tobacco: Never Used  Substance and Sexual Activity  . Alcohol use: No    Alcohol/week: 0.0 oz  . Drug use: No  . Sexual activity: Not on file  Lifestyle  . Physical activity:    Days per week: Not on file    Minutes per session: Not on file  . Stress: Not on file  Relationships  . Social connections:    Talks on phone: Not on file    Gets together: Not on file    Attends religious service: Not on file    Active member of club or organization: Not on file    Attends meetings of clubs or organizations: Not on file    Relationship status: Not on file  Other Topics Concern  . Not on file  Social History Narrative  . Not on file    Review of Systems: General: Negative for anorexia, weight loss, fever, chills, fatigue, weakness. ENT: Negative for hoarseness, difficulty swallowing. Admits nasal congestion and "cold symptoms". CV: Negative for chest pain, angina, palpitations, peripheral edema.  Respiratory: Negative for dyspnea at rest. Admits cough, sputum, wheezing.  GI: See history of present illness. Endo: Negative for unusual weight  change.  Heme: Negative for bruising or bleeding.   Physical Exam: BP 130/66   Pulse 80   Temp (!) 97 F (36.1 C) (Oral)   Ht 5\' 11"  (1.803 m)   Wt 180 lb 3.2 oz (81.7 kg)   BMI 25.13 kg/m  General:   Alert and oriented. Pleasant and cooperative. Well-nourished and well-developed.  Eyes:  Without icterus, sclera clear and conjunctiva pink.  Ears:  Normal auditory acuity. Cardiovascular:  S1, S2 present without murmurs appreciated. Extremities without clubbing or edema. Respiratory:  Bilateral mild end expiratory wheezes. No rales, or rhonchi. No distress.  Gastrointestinal:  +BS, soft, non-tender and non-distended. No HSM noted. No guarding or rebound. No masses appreciated.  Rectal:  Deferred  Musculoskalatal:  Symmetrical without gross deformities. Neurologic:  Alert and oriented x4;  grossly normal neurologically. Psych:  Alert and cooperative. Normal mood and affect. Heme/Lymph/Immune: No excessive bruising noted.    08/17/2017 3:09 PM   Disclaimer: This note was dictated with voice recognition software. Similar sounding words can inadvertently be transcribed and may not be corrected upon review.

## 2017-08-17 NOTE — Assessment & Plan Note (Signed)
It appears the bowel habit changes that he is concerned about is that he previously would leave a fecal streak on toilet tissue when cleaning himself after a bowel movement, and currently he does not.  He is also having softer stools compared to his hard stools but not overt diarrhea.  However, he did have new onset diarrhea this morning in the setting of recent viral illness.  Further diarrhea management as per above.  At this point I feel it is premature to pull the trigger on an early interval colonoscopy.  I will have him call back on Friday with a progress report we can make further decisions at that time.  He does have a family history of colon cancer.  Colonoscopy is up-to-date.

## 2017-08-17 NOTE — Patient Instructions (Signed)
1. I am giving you samples of a probiotic.  Take this once a day for the next 1 to 2 weeks. 2. Call on Friday morning and let us know how you are feeling. 3. Follow-up in 2 months. 4. Call us if you have any questions or concerns.  At [facility] we value your feedback. You may receive a survey about your visit today. Please share your experience as we strive to create trusting relationships with our patients to provide genuine, compassionate, quality care.  It was great to meet you today!  I hope you have a wonderful summer!!

## 2017-08-17 NOTE — Progress Notes (Signed)
cc'ed to pcp °

## 2017-08-17 NOTE — Assessment & Plan Note (Signed)
The patient notes new onset diarrhea as of this morning.  He has had 2 or 3 episodes.  This is in the setting of an ongoing, recently started viral illness.  I feel his viral illness and associated sinus symptoms/drainage is likely contributing responsible for his diarrhea.  I will start him on probiotics.  I will have him call back on Friday with a progress report, given that it is only been 6 hours since onset of symptoms.  If he still having diarrhea at the time we can check stool studies.  If stool studies are normal and diarrhea/stool changes are persistent we can consider early interval colonoscopy in light of a family history of colon cancer.  Return for follow-up in 2 months.

## 2017-08-23 ENCOUNTER — Telehealth: Payer: Self-pay | Admitting: Internal Medicine

## 2017-08-23 NOTE — Telephone Encounter (Signed)
PATIENT CALLED AND SAID HE WAS CALLING WITH AN UPDATE ON HIS HEALTH SINCE IS LAST OFFICE VISIT AND HE SAID HE IS FEELING GREAT

## 2017-08-23 NOTE — Telephone Encounter (Signed)
Spoke with pt and he feels that he is completely back to normal. Pt will call if he needs anything.

## 2017-09-05 NOTE — Telephone Encounter (Signed)
Noted  

## 2017-09-30 ENCOUNTER — Encounter (INDEPENDENT_AMBULATORY_CARE_PROVIDER_SITE_OTHER): Payer: Medicare Other | Admitting: Ophthalmology

## 2017-09-30 DIAGNOSIS — I10 Essential (primary) hypertension: Secondary | ICD-10-CM | POA: Diagnosis not present

## 2017-09-30 DIAGNOSIS — E103593 Type 1 diabetes mellitus with proliferative diabetic retinopathy without macular edema, bilateral: Secondary | ICD-10-CM | POA: Diagnosis not present

## 2017-09-30 DIAGNOSIS — H43813 Vitreous degeneration, bilateral: Secondary | ICD-10-CM

## 2017-09-30 DIAGNOSIS — E10319 Type 1 diabetes mellitus with unspecified diabetic retinopathy without macular edema: Secondary | ICD-10-CM

## 2017-09-30 DIAGNOSIS — H35033 Hypertensive retinopathy, bilateral: Secondary | ICD-10-CM | POA: Diagnosis not present

## 2017-11-10 ENCOUNTER — Ambulatory Visit: Payer: Medicare Other | Admitting: Nurse Practitioner

## 2018-04-04 ENCOUNTER — Encounter (INDEPENDENT_AMBULATORY_CARE_PROVIDER_SITE_OTHER): Payer: Medicare Other | Admitting: Ophthalmology

## 2018-08-13 IMAGING — DX DG CHEST 2V
2 series · 2 of 2 positions shown · non-contrast
Comparison: Chest radiograph performed 11/03/2015

CLINICAL DATA: Subacute onset of shortness of breath on exertion
and cough. Initial encounter.

EXAM:
CHEST  2 VIEW

[chest pa]
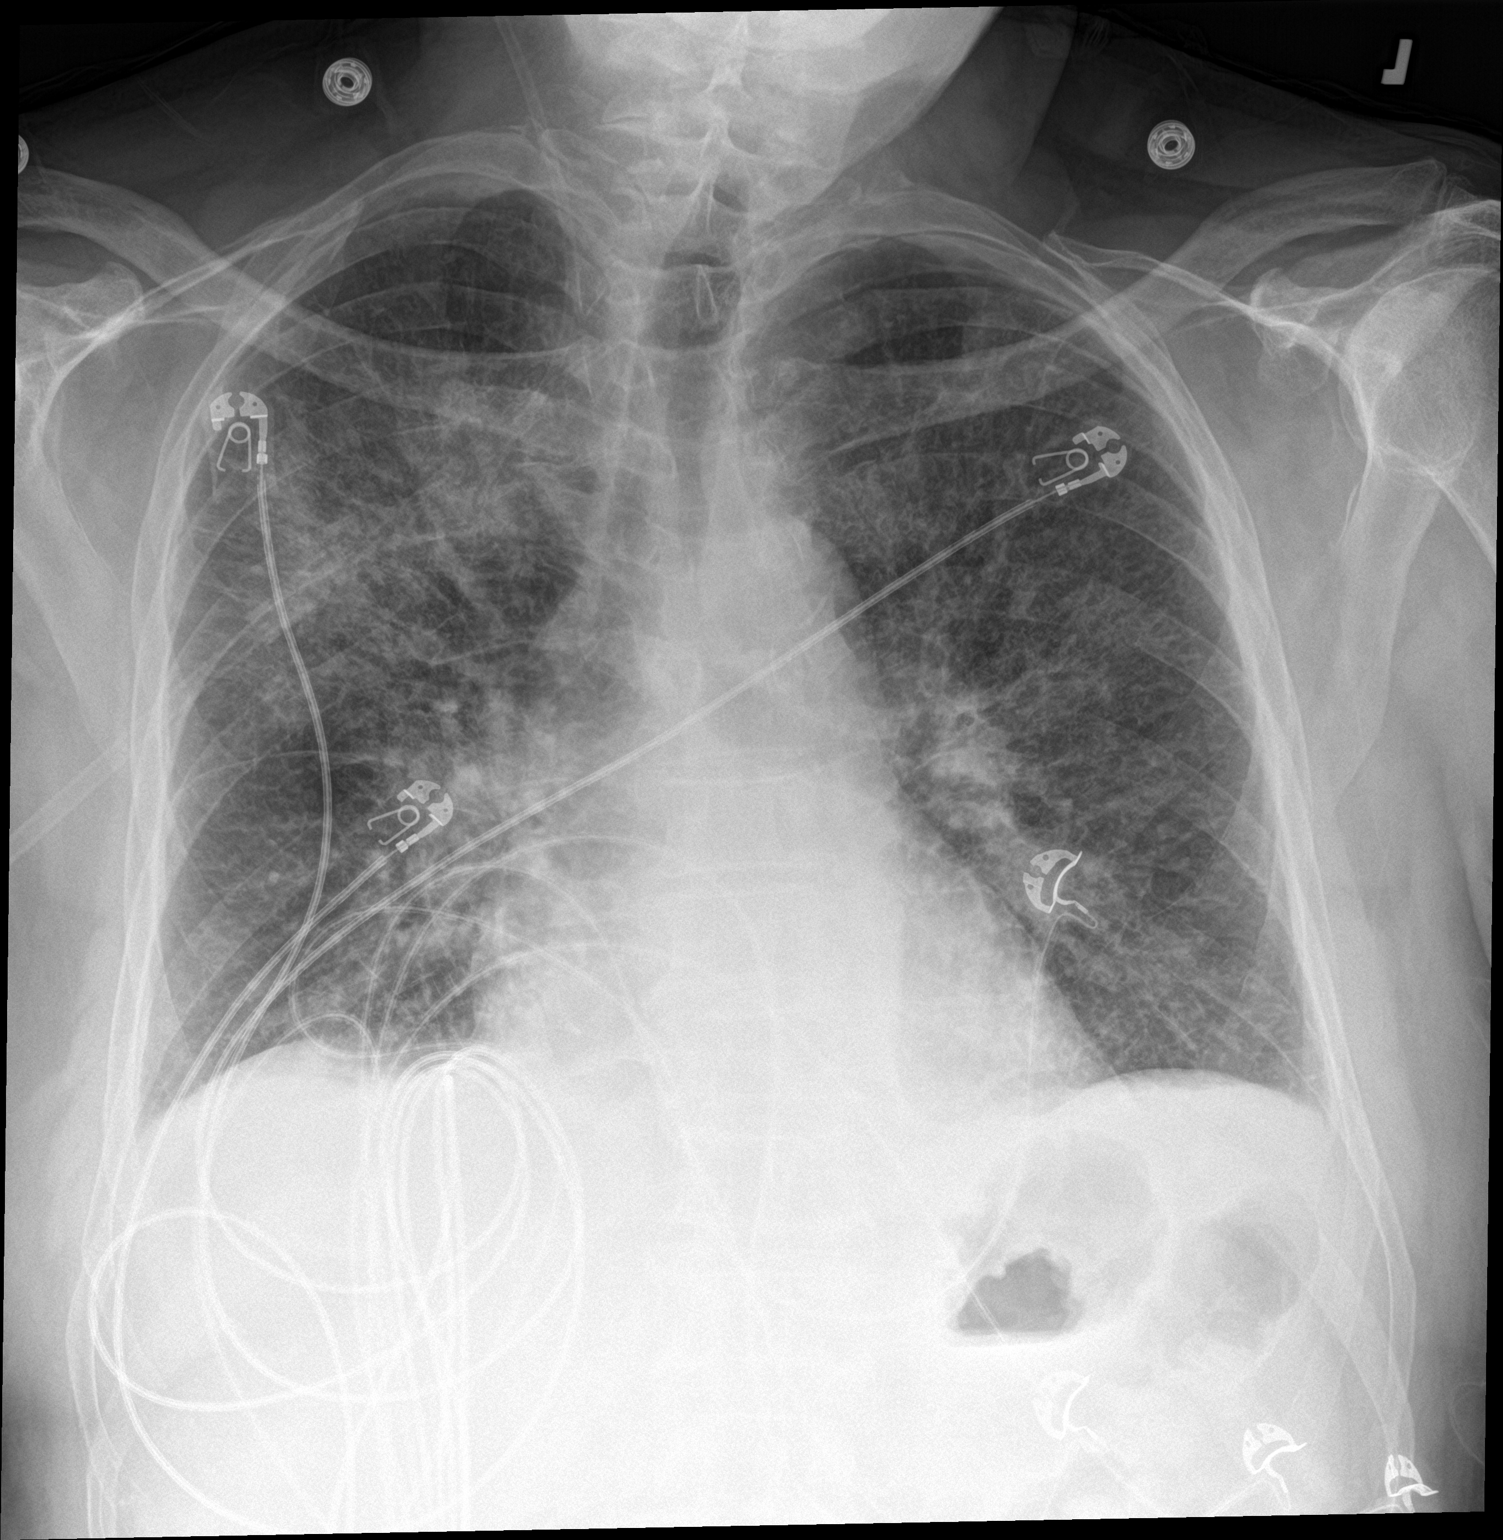

[chest lat]
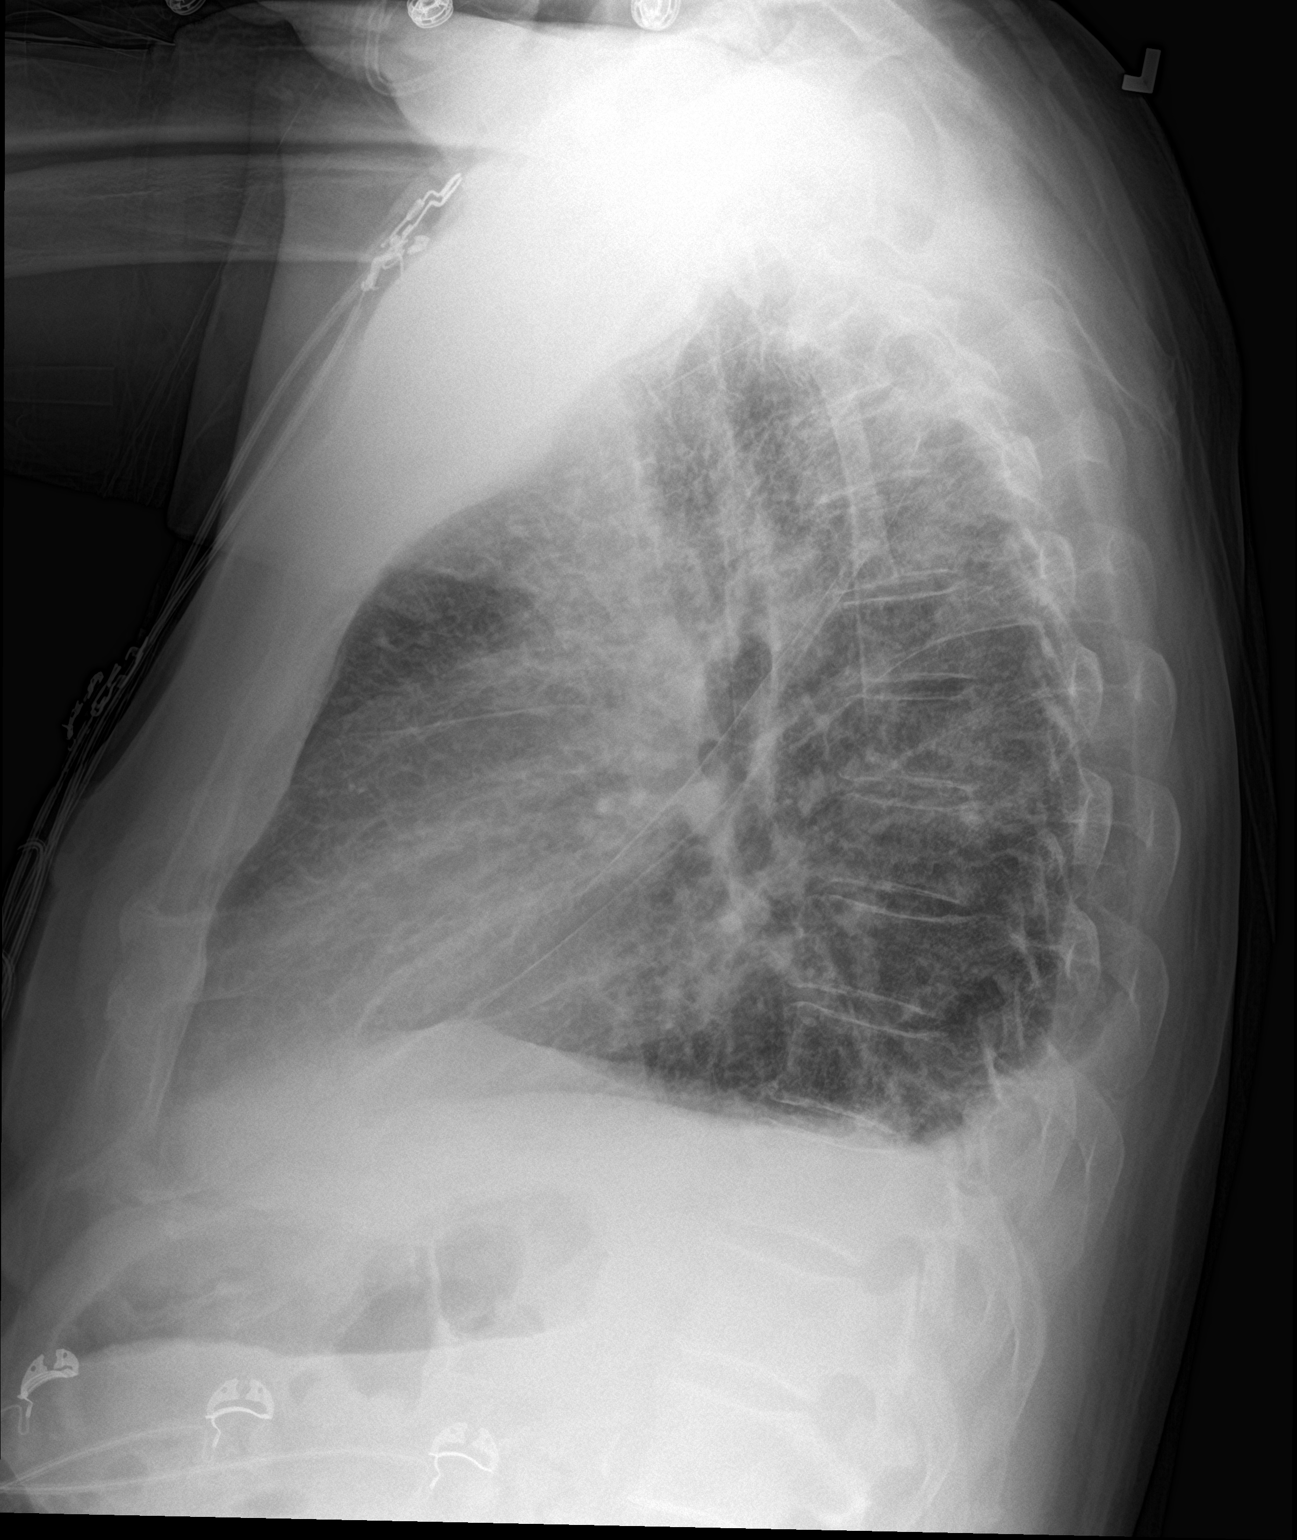

[2 of 2 positions shown; findings below may reference images not displayed]

FINDINGS: The lungs are well-aerated. Vascular congestion is noted. Patchy
right midlung airspace opacity raises concern for pneumonia.
Increased interstitial markings raise question for superimposed
interstitial edema. Small bilateral pleural effusions are suggested.
There is no evidence of pneumothorax.

The heart is normal in size; the mediastinal contour is within
normal limits. No acute osseous abnormalities are seen.
IMPRESSION: Vascular congestion. Patchy right midlung airspace opacity raises
concern for pneumonia. Increased interstitial markings raise
question for superimposed interstitial edema. Small bilateral
pleural effusions suggested.

## 2018-12-22 ENCOUNTER — Other Ambulatory Visit: Payer: Self-pay

## 2018-12-22 DIAGNOSIS — Z20822 Contact with and (suspected) exposure to covid-19: Secondary | ICD-10-CM

## 2018-12-23 LAB — NOVEL CORONAVIRUS, NAA: SARS-CoV-2, NAA: NOT DETECTED

## 2018-12-24 ENCOUNTER — Telehealth: Payer: Self-pay

## 2018-12-24 NOTE — Telephone Encounter (Signed)
Received call from patient checking Covid results.  Advised negative.

## 2019-03-13 DIAGNOSIS — E1139 Type 2 diabetes mellitus with other diabetic ophthalmic complication: Secondary | ICD-10-CM | POA: Diagnosis not present

## 2019-03-13 DIAGNOSIS — Z79899 Other long term (current) drug therapy: Secondary | ICD-10-CM | POA: Diagnosis not present

## 2019-03-13 DIAGNOSIS — E785 Hyperlipidemia, unspecified: Secondary | ICD-10-CM | POA: Diagnosis not present

## 2019-03-13 DIAGNOSIS — I5032 Chronic diastolic (congestive) heart failure: Secondary | ICD-10-CM | POA: Diagnosis not present

## 2019-03-16 DIAGNOSIS — I5032 Chronic diastolic (congestive) heart failure: Secondary | ICD-10-CM | POA: Diagnosis not present

## 2019-03-16 DIAGNOSIS — E1139 Type 2 diabetes mellitus with other diabetic ophthalmic complication: Secondary | ICD-10-CM | POA: Diagnosis not present

## 2019-03-28 NOTE — Progress Notes (Deleted)
Cardiology Office Note    Date:  03/28/2019   ID:  Jeremy Patton, DOB 1940/10/24, MRN [id number]  PCP:  Asencion Noble, MD  Cardiologist: Previously Dr. Claiborne Billings but wished to switch to [facility] (evaluated by Dr. Harl Bowie during prior admission but no outpatient follow-up)  No chief complaint on file.   History of Present Illness:    Jeremy Patton is a 79 y.o. male with past medical history of chronic diastolic CHF, HTN, HLD, GERD, Type 2 DM, mild to moderate MR and carotid artery stenosis (s/p R CEA in 04/2015 and L CEA in 06/2015) who presents to the office today for overdue follow-up.  He was last examined by myself in 06/2016 for hospital follow-up in regards to acute hypoxic respiratory failure in the setting of CHF and PNA. Was overall doing well at the time of his visit and denied any recurrent dyspnea. Reported intermittent episodes of dizziness and BP was soft at 104/58 during his visit, therefore Lisinopril was reduced to 10mg  daily and he was encouraged to monitor BP in the ambulatory setting. This was eventually discontinued given persistent hypotension.   Past Medical History:  Diagnosis Date  . Carotid artery disease ([place])    a. s/p R CEA in 04/2015 b. s/p L CEA in 06/2015  . CHF (congestive heart failure) ([place])   . Diabetes mellitus without complication ([place]) [id number]  . GERD (gastroesophageal reflux disease)   . Hyperlipidemia 11/29/2011  . Hypertension 11/29/2011  . Low back pain 11/29/2011  . Lumbar radiculopathy 11/29/2011   Left  . Pain of left lower leg   . Wears glasses     Past Surgical History:  Procedure Laterality Date  . CATARACT EXTRACTION W/ INTRAOCULAR LENS  IMPLANT, BILATERAL    . CIRCUMCISION     Age 2  . COLONOSCOPY  08/22/2005 and  07/31/2009   minimal internal hemorrhoids, left-sided diverticulosis  . COLONOSCOPY N/A 11/25/2014   Procedure: COLONOSCOPY;  Surgeon: Daneil Dolin, MD;  [facility];  Service: Endoscopy;   Laterality: N/A;  1030  . ENDARTERECTOMY Right 05/06/2015   Procedure: Right Carotid ENDARTERECTOMY ;  Surgeon: Conrad [place], MD;  Location: [place];  Service: Vascular;  Laterality: Right;  . ENDARTERECTOMY Left 06/25/2015   Procedure: LEFT CAROTID ENDARTERECTOMY;  Surgeon: Conrad Gilliam, MD;  Location: [place];  Service: Vascular;  Laterality: Left;  . ESOPHAGEAL DILATION N/A 11/25/2014   Procedure: ESOPHAGEAL DILATION;  Surgeon: Daneil Dolin, MD;  [facility];  Service: Endoscopy;  Laterality: N/A;  . ESOPHAGOGASTRODUODENOSCOPY N/A 11/25/2014   Procedure: ESOPHAGOGASTRODUODENOSCOPY (EGD);  Surgeon: Daneil Dolin, MD;  [facility];  Service: Endoscopy;  Laterality: N/A;  . EYE SURGERY    . MULTIPLE TOOTH EXTRACTIONS    . PATCH ANGIOPLASTY Right 05/06/2015   Procedure: PATCH ANGIOPLASTY;  Surgeon: Conrad Hanover, MD;  Location: [place];  Service: Vascular;  Laterality: Right;  . PATCH ANGIOPLASTY Left 06/25/2015   Procedure: PATCH ANGIOPLASTY USING XENOSURE BIOLOGIC PATCH 1cm X 6cm ;  Surgeon: Conrad , MD;  Location: [place];  Service: Vascular;  Laterality: Left;  . TONSILLECTOMY      Current Medications: Outpatient Medications Prior to Visit  Medication Sig Dispense Refill  . acetaminophen (TYLENOL) 500 MG tablet Take 1,000 mg by mouth 2 (two) times daily.     Marland Kitchen aspirin 81 MG tablet Take 81 mg by mouth daily.    . furosemide (LASIX) 40 MG  tablet Take 1 tablet (40 mg total) by mouth daily. (Patient taking differently: Take 20-40 mg by mouth 2 (two) times daily. Takes 1/2 tablet in morning and 1 in the afternoon) 30 tablet 12  . insulin lispro (HUMALOG KWIKPEN) 100 UNIT/ML KiwkPen Inject 6 Units into the skin.    Marland Kitchen LANTUS SOLOSTAR 100 UNIT/ML Solostar Pen Inject 36 Units into the skin every morning. (Patient taking differently: Inject 30 Units into the skin every morning. ) 15 mL 11  . metFORMIN (GLUCOPHAGE) 1000 MG tablet Take 1,000 mg by mouth 2 (two) times daily with a  meal.    . olmesartan (BENICAR) 20 MG tablet Take 1 tablet by mouth daily.    . pantoprazole (PROTONIX) 40 MG tablet Take 40 mg by mouth daily.     . pioglitazone (ACTOS) 15 MG tablet Take 15 mg by mouth daily.    . potassium chloride SA (K-DUR,KLOR-CON) 20 MEQ tablet Take 1 tablet (20 mEq total) by mouth daily. 30 tablet 12  . rosuvastatin (CRESTOR) 20 MG tablet Take 1 tablet (20 mg total) by mouth daily. Needs appointment for further refills. 2nd notice 30 tablet 0  . verapamil (CALAN-SR) 180 MG CR tablet Take 180 mg by mouth 2 (two) times daily.     No facility-administered medications prior to visit.     Allergies:   Protamine   Social History   Socioeconomic History  . Marital status: Married    Spouse name: Not on file  . Number of children: Not on file  . Years of education: Not on file  . Highest education level: Not on file  Occupational History  . Not on file  Tobacco Use  . Smoking status: Former Smoker    Packs/day: 2.00    Years: 30.00    Pack years: 60.00    Types: Cigarettes    Quit date: 12/17/1991    Years since quitting: 27.2  . Smokeless tobacco: Never Used  Substance and Sexual Activity  . Alcohol use: No    Alcohol/week: 0.0 standard drinks  . Drug use: No  . Sexual activity: Not on file  Other Topics Concern  . Not on file  Social History Narrative  . Not on file   Social Determinants of Health   Financial Resource Strain:   . Difficulty of Paying Living Expenses: Not on file  Food Insecurity:   . Worried About Charity fundraiser in the Last Year: Not on file  . Ran Out of Food in the Last Year: Not on file  Transportation Needs:   . Lack of Transportation (Medical): Not on file  . Lack of Transportation (Non-Medical): Not on file  Physical Activity:   . Days of Exercise per Week: Not on file  . Minutes of Exercise per Session: Not on file  Stress:   . Feeling of Stress : Not on file  Social Connections:   . Frequency of Communication  with Friends and Family: Not on file  . Frequency of Social Gatherings with Friends and Family: Not on file  . Attends Religious Services: Not on file  . Active Member of Clubs or Organizations: Not on file  . Attends Archivist Meetings: Not on file  . Marital Status: Not on file     Family History:  The patient's ***family history includes Colon cancer in his brother; Heart disease in his mother; Stroke in his mother.   Review of Systems:   Please see the history of present illness.  General:  No chills, fever, night sweats or weight changes.  Cardiovascular:  No chest pain, dyspnea on exertion, edema, orthopnea, palpitations, paroxysmal nocturnal dyspnea. Dermatological: No rash, lesions/masses Respiratory: No cough, dyspnea Urologic: No hematuria, dysuria Abdominal:   No nausea, vomiting, diarrhea, bright red blood per rectum, melena, or hematemesis Neurologic:  No visual changes, wkns, changes in mental status. All other systems reviewed and are otherwise negative except as noted above.   Physical Exam:    VS:  There were no vitals taken for this visit.   General: Well developed, well nourished,male appearing in no acute distress. Head: Normocephalic, atraumatic, sclera non-icteric, no xanthomas, nares are without discharge.  Neck: No carotid bruits. JVD not elevated.  Lungs: Respirations regular and unlabored, without wheezes or rales.  Heart: ***Regular rate and rhythm. No S3 or S4.  No murmur, no rubs, or gallops appreciated. Abdomen: Soft, non-tender, non-distended with normoactive bowel sounds. No hepatomegaly. No rebound/guarding. No obvious abdominal masses. Msk:  Strength and tone appear normal for age. No joint deformities or effusions. Extremities: No clubbing or cyanosis. No edema.  Distal pedal pulses are 2+ bilaterally. Neuro: Alert and oriented X 3. Moves all extremities spontaneously. No focal deficits noted. Psych:  Responds to questions  appropriately with a normal affect. Skin: No rashes or lesions noted  Wt Readings from Last 3 Encounters:  08/17/17 180 lb 3.2 oz (81.7 kg)  06/22/17 183 lb (83 kg)  06/25/16 192 lb (87.1 kg)        Studies/Labs Reviewed:   EKG:  EKG is*** ordered today.  The ekg ordered today demonstrates ***  Recent Labs: No results found for requested labs within last 8760 hours.   Lipid Panel    Component Value Date/Time   CHOL 163 04/30/2015 0813   TRIG 55 04/30/2015 0813   HDL 63 04/30/2015 0813   LDLCALC 89 04/30/2015 0813    Additional studies/ records that were reviewed today include:   NST: 04/2015  The left ventricular ejection fraction is normal (55-65%).  Nuclear stress EF: 56%.  There was no ST segment deviation noted during stress.  No T wave inversion was noted during stress.  Defect 1: There is a large defect of moderate severity present in the basal inferior, basal inferolateral, mid inferior, mid inferolateral, apical inferior and apical lateral location. This is likely diaphragmatic attenuation given the normal wall motion. However, cannot rule out prior infarct. There is no ischemia.  This is a low risk study.  Echocardiogram: 05/2016 Study Conclusions   - Left ventricle: The cavity size was normal. Wall thickness was  normal. Systolic function was normal. The estimated ejection  fraction was in the range of 55% to 60%. Possible inferior  hypokinesis. Diastolic function is abnormal, indeterminate grade.  There is evidence of elevated LA pressure.  - Aortic valve: Mildly calcified annulus. Trileaflet; moderately  thickened leaflets. Valve area (VTI): 2.1 cm^2. Valve area  (Vmax): 2.04 cm^2. Valve area (Vmean): 2.1 cm^2.  - Mitral valve: Mildly to moderately calcified annulus. Moderately  thickened leaflets . There was mild to moderate regurgitation. MR  is eccentric and posterior, may be underestimated.  - Left atrium: The atrium was mildly  to moderately dilated.  - Pulmonary veins: There is systolic blunting consistent with  elevated LA pressure.  - Atrial septum: No defect or patent foramen ovale was identified.  - Inferior vena cava: The vessel was dilated. The respirophasic  diameter changes were blunted (< 50%), consistent with elevated  central venous  pressure.   Carotid Dopplers: 06/2017 Final Interpretation:  Right Carotid: Patent right carotid endarterectomy with no evidence of         restenosis and moderate hyperplasia noted in the proximal  patch.         Diffuse, calcified plaque noted in the external carotid  artery         may cause velocities to be underestimated due to acoustic         shadowing.   Left Carotid: Patent left carotid endarterectomy with no evidence of  restenosis        and mild hyperplasia noted in the proximal patch.   Assessment:    No diagnosis found.   Plan:   In order of problems listed above:  1. ***    Medication Adjustments/Labs and Tests Ordered: Current medicines are reviewed at length with the patient today.  Concerns regarding medicines are outlined above.  Medication changes, Labs and Tests ordered today are listed in the Patient Instructions below. There are no Patient Instructions on file for this visit.   Signed, Erma Heritage, PA-C  03/28/2019 11:31 AM    [facility] [address] [place], [place] [address] Phone: [phone] Fax: [phone]

## 2019-03-29 ENCOUNTER — Ambulatory Visit: Payer: Medicare Other | Admitting: Student

## 2019-06-13 DIAGNOSIS — I5032 Chronic diastolic (congestive) heart failure: Secondary | ICD-10-CM | POA: Diagnosis not present

## 2019-06-13 DIAGNOSIS — E1139 Type 2 diabetes mellitus with other diabetic ophthalmic complication: Secondary | ICD-10-CM | POA: Diagnosis not present

## 2019-06-13 DIAGNOSIS — Z79899 Other long term (current) drug therapy: Secondary | ICD-10-CM | POA: Diagnosis not present

## 2019-06-18 DIAGNOSIS — I110 Hypertensive heart disease with heart failure: Secondary | ICD-10-CM | POA: Diagnosis not present

## 2019-06-18 DIAGNOSIS — Z6823 Body mass index (BMI) 23.0-23.9, adult: Secondary | ICD-10-CM | POA: Diagnosis not present

## 2019-06-18 DIAGNOSIS — I10 Essential (primary) hypertension: Secondary | ICD-10-CM | POA: Diagnosis not present

## 2019-06-18 DIAGNOSIS — I5032 Chronic diastolic (congestive) heart failure: Secondary | ICD-10-CM | POA: Diagnosis not present

## 2019-06-18 DIAGNOSIS — E1129 Type 2 diabetes mellitus with other diabetic kidney complication: Secondary | ICD-10-CM | POA: Diagnosis not present

## 2019-06-22 DIAGNOSIS — M5416 Radiculopathy, lumbar region: Secondary | ICD-10-CM | POA: Diagnosis not present

## 2019-07-03 ENCOUNTER — Encounter (INDEPENDENT_AMBULATORY_CARE_PROVIDER_SITE_OTHER): Payer: Medicare Other | Admitting: Ophthalmology

## 2019-07-03 ENCOUNTER — Other Ambulatory Visit: Payer: Self-pay

## 2019-07-03 DIAGNOSIS — E11311 Type 2 diabetes mellitus with unspecified diabetic retinopathy with macular edema: Secondary | ICD-10-CM

## 2019-07-03 DIAGNOSIS — E113511 Type 2 diabetes mellitus with proliferative diabetic retinopathy with macular edema, right eye: Secondary | ICD-10-CM

## 2019-07-03 DIAGNOSIS — E113592 Type 2 diabetes mellitus with proliferative diabetic retinopathy without macular edema, left eye: Secondary | ICD-10-CM

## 2019-07-03 DIAGNOSIS — H35033 Hypertensive retinopathy, bilateral: Secondary | ICD-10-CM | POA: Diagnosis not present

## 2019-07-03 DIAGNOSIS — H43813 Vitreous degeneration, bilateral: Secondary | ICD-10-CM | POA: Diagnosis not present

## 2019-07-03 DIAGNOSIS — I10 Essential (primary) hypertension: Secondary | ICD-10-CM

## 2019-07-03 DIAGNOSIS — H4311 Vitreous hemorrhage, right eye: Secondary | ICD-10-CM

## 2019-07-12 DIAGNOSIS — M5136 Other intervertebral disc degeneration, lumbar region: Secondary | ICD-10-CM | POA: Diagnosis not present

## 2019-07-25 ENCOUNTER — Encounter (INDEPENDENT_AMBULATORY_CARE_PROVIDER_SITE_OTHER): Payer: Medicare Other | Admitting: Ophthalmology

## 2019-08-02 ENCOUNTER — Encounter (INDEPENDENT_AMBULATORY_CARE_PROVIDER_SITE_OTHER): Payer: Medicare HMO | Admitting: Ophthalmology

## 2019-08-02 ENCOUNTER — Other Ambulatory Visit: Payer: Self-pay

## 2019-08-02 DIAGNOSIS — E113592 Type 2 diabetes mellitus with proliferative diabetic retinopathy without macular edema, left eye: Secondary | ICD-10-CM | POA: Diagnosis not present

## 2019-08-02 DIAGNOSIS — E113511 Type 2 diabetes mellitus with proliferative diabetic retinopathy with macular edema, right eye: Secondary | ICD-10-CM | POA: Diagnosis not present

## 2019-08-02 DIAGNOSIS — H4311 Vitreous hemorrhage, right eye: Secondary | ICD-10-CM

## 2019-08-02 DIAGNOSIS — H43813 Vitreous degeneration, bilateral: Secondary | ICD-10-CM

## 2019-08-02 DIAGNOSIS — H35033 Hypertensive retinopathy, bilateral: Secondary | ICD-10-CM | POA: Diagnosis not present

## 2019-08-02 DIAGNOSIS — E11311 Type 2 diabetes mellitus with unspecified diabetic retinopathy with macular edema: Secondary | ICD-10-CM

## 2019-08-02 DIAGNOSIS — I10 Essential (primary) hypertension: Secondary | ICD-10-CM

## 2019-08-03 DIAGNOSIS — M5136 Other intervertebral disc degeneration, lumbar region: Secondary | ICD-10-CM | POA: Diagnosis not present

## 2019-08-30 ENCOUNTER — Encounter (INDEPENDENT_AMBULATORY_CARE_PROVIDER_SITE_OTHER): Payer: Medicare HMO | Admitting: Ophthalmology

## 2019-09-03 ENCOUNTER — Other Ambulatory Visit: Payer: Self-pay

## 2019-09-03 ENCOUNTER — Encounter (INDEPENDENT_AMBULATORY_CARE_PROVIDER_SITE_OTHER): Payer: Medicare HMO | Admitting: Ophthalmology

## 2019-09-03 DIAGNOSIS — E11311 Type 2 diabetes mellitus with unspecified diabetic retinopathy with macular edema: Secondary | ICD-10-CM | POA: Diagnosis not present

## 2019-09-03 DIAGNOSIS — E113511 Type 2 diabetes mellitus with proliferative diabetic retinopathy with macular edema, right eye: Secondary | ICD-10-CM | POA: Diagnosis not present

## 2019-09-03 DIAGNOSIS — H43813 Vitreous degeneration, bilateral: Secondary | ICD-10-CM

## 2019-09-03 DIAGNOSIS — E113592 Type 2 diabetes mellitus with proliferative diabetic retinopathy without macular edema, left eye: Secondary | ICD-10-CM

## 2019-09-03 DIAGNOSIS — H4311 Vitreous hemorrhage, right eye: Secondary | ICD-10-CM | POA: Diagnosis not present

## 2019-09-03 DIAGNOSIS — I10 Essential (primary) hypertension: Secondary | ICD-10-CM | POA: Diagnosis not present

## 2019-09-03 DIAGNOSIS — H35033 Hypertensive retinopathy, bilateral: Secondary | ICD-10-CM

## 2019-09-12 DIAGNOSIS — I5032 Chronic diastolic (congestive) heart failure: Secondary | ICD-10-CM | POA: Diagnosis not present

## 2019-09-12 DIAGNOSIS — I10 Essential (primary) hypertension: Secondary | ICD-10-CM | POA: Diagnosis not present

## 2019-09-12 DIAGNOSIS — E1129 Type 2 diabetes mellitus with other diabetic kidney complication: Secondary | ICD-10-CM | POA: Diagnosis not present

## 2019-09-12 DIAGNOSIS — Z79899 Other long term (current) drug therapy: Secondary | ICD-10-CM | POA: Diagnosis not present

## 2019-09-18 DIAGNOSIS — I5032 Chronic diastolic (congestive) heart failure: Secondary | ICD-10-CM | POA: Diagnosis not present

## 2019-09-18 DIAGNOSIS — I10 Essential (primary) hypertension: Secondary | ICD-10-CM | POA: Diagnosis not present

## 2019-09-18 DIAGNOSIS — E1129 Type 2 diabetes mellitus with other diabetic kidney complication: Secondary | ICD-10-CM | POA: Diagnosis not present

## 2019-09-18 DIAGNOSIS — Z6824 Body mass index (BMI) 24.0-24.9, adult: Secondary | ICD-10-CM | POA: Diagnosis not present

## 2019-10-03 ENCOUNTER — Other Ambulatory Visit: Payer: Self-pay

## 2019-10-03 ENCOUNTER — Encounter (INDEPENDENT_AMBULATORY_CARE_PROVIDER_SITE_OTHER): Payer: Medicare HMO | Admitting: Ophthalmology

## 2019-10-03 DIAGNOSIS — E11311 Type 2 diabetes mellitus with unspecified diabetic retinopathy with macular edema: Secondary | ICD-10-CM

## 2019-10-03 DIAGNOSIS — H4311 Vitreous hemorrhage, right eye: Secondary | ICD-10-CM

## 2019-10-03 DIAGNOSIS — I10 Essential (primary) hypertension: Secondary | ICD-10-CM

## 2019-10-03 DIAGNOSIS — H35033 Hypertensive retinopathy, bilateral: Secondary | ICD-10-CM | POA: Diagnosis not present

## 2019-10-03 DIAGNOSIS — E113513 Type 2 diabetes mellitus with proliferative diabetic retinopathy with macular edema, bilateral: Secondary | ICD-10-CM | POA: Diagnosis not present

## 2019-10-15 ENCOUNTER — Ambulatory Visit: Payer: Medicare HMO | Admitting: "Endocrinology

## 2019-10-31 ENCOUNTER — Encounter (INDEPENDENT_AMBULATORY_CARE_PROVIDER_SITE_OTHER): Payer: Medicare HMO | Admitting: Ophthalmology

## 2019-10-31 ENCOUNTER — Other Ambulatory Visit: Payer: Self-pay

## 2019-10-31 DIAGNOSIS — H35033 Hypertensive retinopathy, bilateral: Secondary | ICD-10-CM | POA: Diagnosis not present

## 2019-10-31 DIAGNOSIS — E113592 Type 2 diabetes mellitus with proliferative diabetic retinopathy without macular edema, left eye: Secondary | ICD-10-CM | POA: Diagnosis not present

## 2019-10-31 DIAGNOSIS — E11311 Type 2 diabetes mellitus with unspecified diabetic retinopathy with macular edema: Secondary | ICD-10-CM | POA: Diagnosis not present

## 2019-10-31 DIAGNOSIS — E113511 Type 2 diabetes mellitus with proliferative diabetic retinopathy with macular edema, right eye: Secondary | ICD-10-CM | POA: Diagnosis not present

## 2019-10-31 DIAGNOSIS — H4311 Vitreous hemorrhage, right eye: Secondary | ICD-10-CM

## 2019-10-31 DIAGNOSIS — H43813 Vitreous degeneration, bilateral: Secondary | ICD-10-CM | POA: Diagnosis not present

## 2019-10-31 DIAGNOSIS — I10 Essential (primary) hypertension: Secondary | ICD-10-CM | POA: Diagnosis not present

## 2019-12-19 ENCOUNTER — Encounter: Payer: Self-pay | Admitting: Internal Medicine

## 2019-12-19 ENCOUNTER — Encounter (INDEPENDENT_AMBULATORY_CARE_PROVIDER_SITE_OTHER): Payer: Medicare HMO | Admitting: Ophthalmology

## 2019-12-19 DIAGNOSIS — I10 Essential (primary) hypertension: Secondary | ICD-10-CM | POA: Diagnosis not present

## 2019-12-19 DIAGNOSIS — I5032 Chronic diastolic (congestive) heart failure: Secondary | ICD-10-CM | POA: Diagnosis not present

## 2019-12-19 DIAGNOSIS — E1139 Type 2 diabetes mellitus with other diabetic ophthalmic complication: Secondary | ICD-10-CM | POA: Diagnosis not present

## 2019-12-19 DIAGNOSIS — E1129 Type 2 diabetes mellitus with other diabetic kidney complication: Secondary | ICD-10-CM | POA: Diagnosis not present

## 2019-12-19 DIAGNOSIS — Z79899 Other long term (current) drug therapy: Secondary | ICD-10-CM | POA: Diagnosis not present

## 2019-12-24 DIAGNOSIS — E1122 Type 2 diabetes mellitus with diabetic chronic kidney disease: Secondary | ICD-10-CM | POA: Diagnosis not present

## 2019-12-24 DIAGNOSIS — I10 Essential (primary) hypertension: Secondary | ICD-10-CM | POA: Diagnosis not present

## 2019-12-24 DIAGNOSIS — E785 Hyperlipidemia, unspecified: Secondary | ICD-10-CM | POA: Diagnosis not present

## 2019-12-24 DIAGNOSIS — I5032 Chronic diastolic (congestive) heart failure: Secondary | ICD-10-CM | POA: Diagnosis not present

## 2019-12-24 DIAGNOSIS — I110 Hypertensive heart disease with heart failure: Secondary | ICD-10-CM | POA: Diagnosis not present

## 2019-12-28 DIAGNOSIS — N3000 Acute cystitis without hematuria: Secondary | ICD-10-CM | POA: Diagnosis not present

## 2019-12-31 NOTE — Progress Notes (Signed)
Subjective: 1. Difficulty urinating   2. BPH with urinary obstruction   3. Bilateral low back pain, unspecified chronicity, unspecified whether sciatica present   4. Weak urinary stream   5. Urinary frequency   6. 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 Jeremy Patton is a former patient of Dr. Gaynelle Arabian with a history of BPH with LUT and UTI's. He is sent in consultation by Dr. Willey Blade with the complaint of burning with urination with a negative UA on 12/28/19.   He has a 1-2 week history of pain with voiding.   He has to strain to void and that causes pain in his upper back and legs.  The pain resolved when he finishes voiding and he can have some pain with an awkward step.  He has no dysuria.  He has frequency 3-6x daily.  He has small voids with intermittency and he doesn't feel empty.  He has nocturia x 1.  He has no issues with urgency.   He has had no hematuria.  He has had no recent UTI's. He has had no GU surgery.   He was given Cipro by Dr. Willey Blade but his UCx was negative and he stopped the med.   He has not been on BPH meds.  He is a diabetic and has neuropathy in his feet.     ROS:  ROS  Allergies  Allergen Reactions  . Protamine     hypotension    Past Medical History:  Diagnosis Date  . Arthritis   . Carotid artery disease ([place])    a. s/p R CEA in 04/2015 b. s/p L CEA in 06/2015  . CHF (congestive heart failure) ([place])   . Diabetes mellitus without complication ([place]) 86/08/6193  . GERD (gastroesophageal reflux disease)   . Heart murmur   . High cholesterol   . Hyperlipidemia 11/29/2011  . Hypertension 11/29/2011  . Low back pain 11/29/2011  . Lumbar radiculopathy 11/29/2011   Left  . Pain of left lower leg   . Wears glasses     Past Surgical History:  Procedure Laterality Date  . CATARACT EXTRACTION W/ INTRAOCULAR LENS  IMPLANT, BILATERAL    . CIRCUMCISION     Age 79  . COLONOSCOPY  08/22/2005 and  07/31/2009   minimal internal  hemorrhoids, left-sided diverticulosis  . COLONOSCOPY N/A 11/25/2014   Procedure: COLONOSCOPY;  Surgeon: Daneil Dolin, MD;  [facility];  Service: Endoscopy;  Laterality: N/A;  1030  . ENDARTERECTOMY Right 05/06/2015   Procedure: Right Carotid ENDARTERECTOMY ;  Surgeon: Conrad Aguila, MD;  Location: [place];  Service: Vascular;  Laterality: Right;  . ENDARTERECTOMY Left 06/25/2015   Procedure: LEFT CAROTID ENDARTERECTOMY;  Surgeon: Conrad [place], MD;  Location: [place];  Service: Vascular;  Laterality: Left;  . ESOPHAGEAL DILATION N/A 11/25/2014   Procedure: ESOPHAGEAL DILATION;  Surgeon: Daneil Dolin, MD;  [facility];  Service: Endoscopy;  Laterality: N/A;  . ESOPHAGOGASTRODUODENOSCOPY N/A 11/25/2014   Procedure: ESOPHAGOGASTRODUODENOSCOPY (EGD);  Surgeon: Daneil Dolin, MD;  [facility];  Service: Endoscopy;  Laterality: N/A;  . EYE SURGERY    . MULTIPLE TOOTH EXTRACTIONS    . PATCH ANGIOPLASTY Right 05/06/2015   Procedure: PATCH ANGIOPLASTY;  Surgeon: Conrad , MD;  Location: [place];  Service: Vascular;  Laterality: Right;  . PATCH ANGIOPLASTY Left 06/25/2015   Procedure: PATCH ANGIOPLASTY USING XENOSURE BIOLOGIC PATCH 1cm X 6cm ;  Surgeon: Conrad , MD;  Location: [place];  Service: Vascular;  Laterality: Left;  . TONSILLECTOMY      Social History   Socioeconomic History  . Marital status: Married    Spouse name: Not on file  . Number of children: 4  . Years of education: Not on file  . Highest education level: Not on file  Occupational History  . Occupation: retired  Tobacco Use  . Smoking status: Former Smoker    Packs/day: 2.00    Years: 30.00    Pack years: 60.00    Types: Cigarettes    Quit date: 12/17/1991    Years since quitting: 28.0  . Smokeless tobacco: Never Used  Vaping Use  . Vaping Use: Never used  Substance and Sexual Activity  . Alcohol use: No    Alcohol/week: 0.0 standard drinks  . Drug use: No  . Sexual activity: Not  on file  Other Topics Concern  . Not on file  Social History Narrative  . Not on file   Social Determinants of Health   Financial Resource Strain:   . Difficulty of Paying Living Expenses: Not on file  Food Insecurity:   . Worried About Charity fundraiser in the Last Year: Not on file  . Ran Out of Food in the Last Year: Not on file  Transportation Needs:   . Lack of Transportation (Medical): Not on file  . Lack of Transportation (Non-Medical): Not on file  Physical Activity:   . Days of Exercise per Week: Not on file  . Minutes of Exercise per Session: Not on file  Stress:   . Feeling of Stress : Not on file  Social Connections:   . Frequency of Communication with Friends and Family: Not on file  . Frequency of Social Gatherings with Friends and Family: Not on file  . Attends Religious Services: Not on file  . Active Member of Clubs or Organizations: Not on file  . Attends Archivist Meetings: Not on file  . Marital Status: Not on file  Intimate Partner Violence:   . Fear of Current or Ex-Partner: Not on file  . Emotionally Abused: Not on file  . Physically Abused: Not on file  . Sexually Abused: Not on file    Family History  Problem Relation Age of Onset  . Stroke Mother   . Heart disease Mother        before age 14  . Colon cancer Brother        Age 90    Anti-infectives: Anti-infectives (From admission, onward)   None      Current Outpatient Medications  Medication Sig Dispense Refill  . acetaminophen (TYLENOL) 500 MG tablet Take 1,000 mg by mouth 2 (two) times daily.     Marland Kitchen ALPRAZolam (XANAX) 0.25 MG tablet Take 0.25 mg by mouth daily as needed.    Marland Kitchen aspirin 81 MG tablet Take 81 mg by mouth daily.    . furosemide (LASIX) 40 MG tablet Take 1 tablet (40 mg total) by mouth daily. (Patient taking differently: Take 20-40 mg by mouth 2 (two) times daily. Takes 1/2 tablet in morning and 1 in the afternoon) 30 tablet 12  . insulin lispro (HUMALOG  KWIKPEN) 100 UNIT/ML KiwkPen Inject 6 Units into the skin.    Marland Kitchen LANTUS SOLOSTAR 100 UNIT/ML Solostar Pen Inject 36 Units into the skin every morning. (Patient taking differently: Inject 30 Units into the skin every morning. ) 15 mL 11  .  metFORMIN (GLUCOPHAGE) 1000 MG tablet Take 1,000 mg by mouth 2 (two) times daily with a meal.    . olmesartan (BENICAR) 20 MG tablet Take 1 tablet by mouth daily.    . pantoprazole (PROTONIX) 40 MG tablet Take 40 mg by mouth daily.     . pioglitazone (ACTOS) 15 MG tablet Take 15 mg by mouth daily.    . potassium chloride SA (K-DUR,KLOR-CON) 20 MEQ tablet Take 1 tablet (20 mEq total) by mouth daily. 30 tablet 12  . rosuvastatin (CRESTOR) 20 MG tablet Take 1 tablet (20 mg total) by mouth daily. Needs appointment for further refills. 2nd notice 30 tablet 0  . verapamil (CALAN-SR) 180 MG CR tablet Take 180 mg by mouth 2 (two) times daily.    . tamsulosin (FLOMAX) 0.4 MG CAPS capsule Take 1 capsule (0.4 mg total) by mouth daily. 30 capsule 11   No current facility-administered medications for this visit.     Objective: Vital signs in last 24 hours: BP 140/69   Pulse 88   Temp 98.2 F (36.8 C)   Ht 5\' 11"  (1.803 m)   Wt 155 lb (70.3 kg)   BMI 21.62 kg/m   Intake/Output from previous day: No intake/output data recorded. Intake/Output this shift: @IOTHISSHIFT @   Physical Exam Vitals reviewed.  Constitutional:      Appearance: Normal appearance.  HENT:     Head: Normocephalic and atraumatic.  Cardiovascular:     Rate and Rhythm: Normal rate and regular rhythm.     Heart sounds: Normal heart sounds.  Pulmonary:     Effort: Pulmonary effort is normal.     Breath sounds: Normal breath sounds.  Abdominal:     General: Abdomen is flat.     Palpations: Abdomen is soft. There is no mass.     Tenderness: There is no abdominal tenderness.     Hernia: No hernia is present.  Genitourinary:    Comments: Normal penis with adequate meatus. Scrotum, testes  and epididymis normal. AP without lesions. NST without mass. Prostate 2+ benign.  SV non-palpable.  Musculoskeletal:     Cervical back: Normal range of motion and neck supple.  Neurological:     Mental Status: He is alert.     Lab Results:  UA with Dr. Willey Blade was negative and his Cr was 1.13.   UA is clear.  Studies/Results: PVR is 124ml.   Lumbar series in 2018 showed lumbar DDD.  Assessment/Plan: BPH with BOO and back pain from straining to void with underlying lumbar DDD.   I am going to start him on tamsulosin and reviewed the side effects.   He will return in 4-6 week for a flowrate and PVR and possible cystoscsopy.    Meds ordered this encounter  Medications  . tamsulosin (FLOMAX) 0.4 MG CAPS capsule    Sig: Take 1 capsule (0.4 mg total) by mouth daily.    Dispense:  30 capsule    Refill:  11     Orders Placed This Encounter  Procedures  . Urinalysis, Routine w reflex microscopic  . Bladder scan     Return for 4-6 for a flow rate and PVR.   Possible cystoscopy. .    CC: Dr. Sinda Du 01/01/2020 [phone]

## 2020-01-01 ENCOUNTER — Other Ambulatory Visit: Payer: Self-pay

## 2020-01-01 ENCOUNTER — Ambulatory Visit (INDEPENDENT_AMBULATORY_CARE_PROVIDER_SITE_OTHER): Payer: Medicare HMO | Admitting: Urology

## 2020-01-01 ENCOUNTER — Encounter: Payer: Self-pay | Admitting: Urology

## 2020-01-01 VITALS — BP 140/69 | HR 88 | Temp 98.2°F | Ht 71.0 in | Wt 155.0 lb

## 2020-01-01 DIAGNOSIS — N138 Other obstructive and reflux uropathy: Secondary | ICD-10-CM

## 2020-01-01 DIAGNOSIS — M545 Low back pain, unspecified: Secondary | ICD-10-CM | POA: Diagnosis not present

## 2020-01-01 DIAGNOSIS — R3912 Poor urinary stream: Secondary | ICD-10-CM | POA: Diagnosis not present

## 2020-01-01 DIAGNOSIS — R350 Frequency of micturition: Secondary | ICD-10-CM

## 2020-01-01 DIAGNOSIS — R39198 Other difficulties with micturition: Secondary | ICD-10-CM | POA: Diagnosis not present

## 2020-01-01 DIAGNOSIS — E0940 Drug or chemical induced diabetes mellitus with neurological complications with diabetic neuropathy, unspecified: Secondary | ICD-10-CM | POA: Diagnosis not present

## 2020-01-01 DIAGNOSIS — N401 Enlarged prostate with lower urinary tract symptoms: Secondary | ICD-10-CM | POA: Diagnosis not present

## 2020-01-01 LAB — URINALYSIS, ROUTINE W REFLEX MICROSCOPIC
Bilirubin, UA: NEGATIVE
Ketones, UA: NEGATIVE
Leukocytes,UA: NEGATIVE
Nitrite, UA: NEGATIVE
Protein,UA: NEGATIVE
RBC, UA: NEGATIVE
Specific Gravity, UA: 1.015 (ref 1.005–1.030)
Urobilinogen, Ur: 0.2 mg/dL (ref 0.2–1.0)
pH, UA: 5.5 (ref 5.0–7.5)

## 2020-01-01 LAB — BLADDER SCAN: Scan Result: 113

## 2020-01-01 MED ORDER — TAMSULOSIN HCL 0.4 MG PO CAPS: 0.4000 mg | ORAL_CAPSULE | Freq: Every day | ORAL | 11 refills | Status: DC

## 2020-01-01 NOTE — Progress Notes (Signed)
Bladder Scan Patient can void: 113 ml Performed By: Laqueena Hinchey,LPN   Urological Symptom Review  Patient is experiencing the following symptoms: Frequent urination Hard to postpone urination Burning/pain with urination Get up at night to urinate Stream starts and stops Trouble starting stream Weak stream   Review of Systems  Gastrointestinal (upper)  : Nausea  Gastrointestinal (lower) : Negative for lower GI symptoms  Constitutional : Negative for symptoms  Skin: Itching  Eyes: Blurred vision Double vision  Ear/Nose/Throat : Negative for Ear/Nose/Throat symptoms  Hematologic/Lymphatic: Negative for Hematologic/Lymphatic symptoms  Cardiovascular : Negative for cardiovascular symptoms  Respiratory : Negative for respiratory symptoms  Endocrine: Negative for endocrine symptoms  Musculoskeletal: Back pain Joint pain  Neurological: Headaches Dizziness  Psychologic: Negative for psychiatric symptoms

## 2020-01-15 ENCOUNTER — Other Ambulatory Visit: Payer: Self-pay

## 2020-01-15 ENCOUNTER — Ambulatory Visit
Admission: EM | Admit: 2020-01-15 | Discharge: 2020-01-15 | Disposition: A | Discharge status: Home / Self Care | Payer: Medicare HMO | Attending: Emergency Medicine | Admitting: Emergency Medicine

## 2020-01-15 DIAGNOSIS — Z23 Encounter for immunization: Secondary | ICD-10-CM

## 2020-01-15 DIAGNOSIS — S61211A Laceration without foreign body of left index finger without damage to nail, initial encounter: Secondary | ICD-10-CM | POA: Diagnosis not present

## 2020-01-15 MED ORDER — TETANUS-DIPHTH-ACELL PERTUSSIS 5-2.5-18.5 LF-MCG/0.5 IM SUSY
0.5000 mL | PREFILLED_SYRINGE | Freq: Once | INTRAMUSCULAR | Status: AC
  Administered 2020-01-15: 0.5 mL via INTRAMUSCULAR

## 2020-01-15 MED ORDER — CEPHALEXIN 500 MG PO CAPS: 500.0000 mg | ORAL_CAPSULE | Freq: Three times a day (TID) | ORAL | 0 refills | Status: AC

## 2020-01-15 NOTE — ED Triage Notes (Signed)
Pt presents with laceration to left index finger from box cutter this morning , bleeding controlled

## 2020-01-15 NOTE — Discharge Instructions (Signed)
Bandage applied Keep covered for next and dry for next 24-48 hours.  After then you may gently clean with warm water and mild soap.  Avoid submerging wound in water. Change dressing daily and apply a thin layer of neosporin.  Return in 10-12 days to have sutures removed.   Take OTC ibuprofen or tylenol as needed for pain relief Keflex prescribed for possible infection.   Return sooner or go to the ED if you have any new or worsening symptoms such as increased pain, redness, swelling, drainage, discharge, decreased range of motion of extremity, etc..

## 2020-01-15 NOTE — ED Provider Notes (Signed)
[place]   [id number] 01/15/20 Arrival Time: 1223  CC: LACERATION  SUBJECTIVE:  Jeremy Patton is a 79 y.o. male who presents with a laceration that occurred this morning.  Symptoms began after using a box cutter.  Cut pointer finger of left hand.  Bleeding.  Currently on blood thinners.  Denies similar symptoms in the past.  Denies fever, chills, nausea, vomiting, redness, swelling, purulent drainage, decrease strength or sensation.   Td UTD: Unknown.  ROS: As per HPI.  All other pertinent ROS negative.     Past Medical History:  Diagnosis Date  . Arthritis   . Carotid artery disease ([place])    a. s/p R CEA in 04/2015 b. s/p L CEA in 06/2015  . CHF (congestive heart failure) ([place])   . Diabetes mellitus without complication ([place]) 47/0/9628  . GERD (gastroesophageal reflux disease)   . Heart murmur   . High cholesterol   . Hyperlipidemia 11/29/2011  . Hypertension 11/29/2011  . Low back pain 11/29/2011  . Lumbar radiculopathy 11/29/2011   Left  . Pain of left lower leg   . Wears glasses    Past Surgical History:  Procedure Laterality Date  . CATARACT EXTRACTION W/ INTRAOCULAR LENS  IMPLANT, BILATERAL    . CIRCUMCISION     Age 38  . COLONOSCOPY  08/22/2005 and  07/31/2009   minimal internal hemorrhoids, left-sided diverticulosis  . COLONOSCOPY N/A 11/25/2014   Procedure: COLONOSCOPY;  Surgeon: Daneil Dolin, MD;  [facility];  Service: Endoscopy;  Laterality: N/A;  1030  . ENDARTERECTOMY Right 05/06/2015   Procedure: Right Carotid ENDARTERECTOMY ;  Surgeon: Conrad [place], MD;  Location: [place];  Service: Vascular;  Laterality: Right;  . ENDARTERECTOMY Left 06/25/2015   Procedure: LEFT CAROTID ENDARTERECTOMY;  Surgeon: Conrad [place], MD;  Location: [place];  Service: Vascular;  Laterality: Left;  . ESOPHAGEAL DILATION N/A 11/25/2014   Procedure: ESOPHAGEAL DILATION;  Surgeon: Daneil Dolin, MD;  [facility];  Service: Endoscopy;  Laterality: N/A;   . ESOPHAGOGASTRODUODENOSCOPY N/A 11/25/2014   Procedure: ESOPHAGOGASTRODUODENOSCOPY (EGD);  Surgeon: Daneil Dolin, MD;  [facility];  Service: Endoscopy;  Laterality: N/A;  . EYE SURGERY    . MULTIPLE TOOTH EXTRACTIONS    . PATCH ANGIOPLASTY Right 05/06/2015   Procedure: PATCH ANGIOPLASTY;  Surgeon: Conrad , MD;  Location: [place];  Service: Vascular;  Laterality: Right;  . PATCH ANGIOPLASTY Left 06/25/2015   Procedure: PATCH ANGIOPLASTY USING XENOSURE BIOLOGIC PATCH 1cm X 6cm ;  Surgeon: Conrad , MD;  Location: [place];  Service: Vascular;  Laterality: Left;  . TONSILLECTOMY     Allergies  Allergen Reactions  . Protamine     hypotension   No current facility-administered medications on file prior to encounter.   Current Outpatient Medications on File Prior to Encounter  Medication Sig Dispense Refill  . acetaminophen (TYLENOL) 500 MG tablet Take 1,000 mg by mouth 2 (two) times daily.     Marland Kitchen ALPRAZolam (XANAX) 0.25 MG tablet Take 0.25 mg by mouth daily as needed.    Marland Kitchen aspirin 81 MG tablet Take 81 mg by mouth daily.    . furosemide (LASIX) 40 MG tablet Take 1 tablet (40 mg total) by mouth daily. (Patient taking differently: Take 20-40 mg by mouth 2 (two) times daily. Takes 1/2 tablet in morning and 1 in the afternoon) 30 tablet 12  . insulin lispro (HUMALOG KWIKPEN) 100 UNIT/ML KiwkPen Inject  6 Units into the skin.    Marland Kitchen LANTUS SOLOSTAR 100 UNIT/ML Solostar Pen Inject 36 Units into the skin every morning. (Patient taking differently: Inject 30 Units into the skin every morning. ) 15 mL 11  . metFORMIN (GLUCOPHAGE) 1000 MG tablet Take 1,000 mg by mouth 2 (two) times daily with a meal.    . olmesartan (BENICAR) 20 MG tablet Take 1 tablet by mouth daily.    . pantoprazole (PROTONIX) 40 MG tablet Take 40 mg by mouth daily.     . pioglitazone (ACTOS) 15 MG tablet Take 15 mg by mouth daily.    . potassium chloride SA (K-DUR,KLOR-CON) 20 MEQ tablet Take 1 tablet (20 mEq total) by  mouth daily. 30 tablet 12  . rosuvastatin (CRESTOR) 20 MG tablet Take 1 tablet (20 mg total) by mouth daily. Needs appointment for further refills. 2nd notice 30 tablet 0  . tamsulosin (FLOMAX) 0.4 MG CAPS capsule Take 1 capsule (0.4 mg total) by mouth daily. 30 capsule 11  . verapamil (CALAN-SR) 180 MG CR tablet Take 180 mg by mouth 2 (two) times daily.     Social History   Socioeconomic History  . Marital status: Married    Spouse name: Not on file  . Number of children: 4  . Years of education: Not on file  . Highest education level: Not on file  Occupational History  . Occupation: retired  Tobacco Use  . Smoking status: Former Smoker    Packs/day: 2.00    Years: 30.00    Pack years: 60.00    Types: Cigarettes    Quit date: 12/17/1991    Years since quitting: 28.0  . Smokeless tobacco: Never Used  Vaping Use  . Vaping Use: Never used  Substance and Sexual Activity  . Alcohol use: No    Alcohol/week: 0.0 standard drinks  . Drug use: No  . Sexual activity: Not on file  Other Topics Concern  . Not on file  Social History Narrative  . Not on file   Social Determinants of Health   Financial Resource Strain:   . Difficulty of Paying Living Expenses: Not on file  Food Insecurity:   . Worried About Charity fundraiser in the Last Year: Not on file  . Ran Out of Food in the Last Year: Not on file  Transportation Needs:   . Lack of Transportation (Medical): Not on file  . Lack of Transportation (Non-Medical): Not on file  Physical Activity:   . Days of Exercise per Week: Not on file  . Minutes of Exercise per Session: Not on file  Stress:   . Feeling of Stress : Not on file  Social Connections:   . Frequency of Communication with Friends and Family: Not on file  . Frequency of Social Gatherings with Friends and Family: Not on file  . Attends Religious Services: Not on file  . Active Member of Clubs or Organizations: Not on file  . Attends Archivist  Meetings: Not on file  . Marital Status: Not on file  Intimate Partner Violence:   . Fear of Current or Ex-Partner: Not on file  . Emotionally Abused: Not on file  . Physically Abused: Not on file  . Sexually Abused: Not on file   Family History  Problem Relation Age of Onset  . Stroke Mother   . Heart disease Mother        before age 35  . Colon cancer Brother  Age 105     OBJECTIVE:  Vitals:   01/15/20 1240  BP: 132/72  Pulse: 88  Resp: 18  Temp: 98.5 F (36.9 C)  SpO2: 98%     General appearance: alert; no distress CV: radial pulse 2+ Skin: laceration of distal second digit of plantar aspect; size: approx 2 cm Psychological: alert and cooperative; normal mood and affect  Procedure: Verbal consent obtained. Patient provided with risks and alternatives to the procedure. Wound copiously irrigated with NS then cleansed with betadine. Anesthetized with 4 mL of lidocaine without epinephrine. Wound carefully explored. No foreign body, tendon injury, or nonviable tissue were noted. Using sterile technique 1 horizontal and 2 interrupted 5-0 Prolene sutures were placed to reapproximate the wound. Patient tolerated procedure well. No complications. Minimal bleeding. Patient advised to look for and return for any signs of infection such as redness, swelling, discharge, or worsening pain. Return for suture removal in 10-12 days.  ASSESSMENT & PLAN:  1. Laceration of left index finger without foreign body without damage to nail, initial encounter     Meds ordered this encounter  Medications  . cephALEXin (KEFLEX) 500 MG capsule    Sig: Take 1 capsule (500 mg total) by mouth 3 (three) times daily for 10 days.    Dispense:  30 capsule    Refill:  0    Order Specific Question:   Supervising Provider    Answer:   Raylene Everts [[id number]]    Bandage applied Keep covered for next and dry for next 24-48 hours.  After then you may gently clean with warm water and mild  soap.  Avoid submerging wound in water. Change dressing daily and apply a thin layer of neosporin.  Return in 10-12 days to have sutures removed.   Take OTC ibuprofen or tylenol as needed for pain relief Keflex prescribed for possible infection.   Return sooner or go to the ED if you have any new or worsening symptoms such as increased pain, redness, swelling, drainage, discharge, decreased range of motion of extremity, etc..     Reviewed expectations re: course of current medical issues. Questions answered. Outlined signs and symptoms indicating need for more acute intervention. Patient verbalized understanding. After Visit Summary given.   Lestine Box, PA-C 01/15/20 1347

## 2020-01-26 ENCOUNTER — Encounter: Payer: Self-pay | Admitting: Emergency Medicine

## 2020-01-26 ENCOUNTER — Ambulatory Visit
Admission: EM | Admit: 2020-01-26 | Discharge: 2020-01-26 | Disposition: A | Discharge status: Home / Self Care | Payer: Medicare HMO

## 2020-01-26 DIAGNOSIS — Z4802 Encounter for removal of sutures: Secondary | ICD-10-CM

## 2020-01-26 NOTE — ED Triage Notes (Signed)
Suture removal to LT index finger

## 2020-02-12 ENCOUNTER — Ambulatory Visit: Payer: Medicare PPO | Admitting: Nurse Practitioner

## 2020-03-06 ENCOUNTER — Ambulatory Visit (INDEPENDENT_AMBULATORY_CARE_PROVIDER_SITE_OTHER): Payer: Medicare HMO | Admitting: Urology

## 2020-03-06 ENCOUNTER — Encounter: Payer: Self-pay | Admitting: Urology

## 2020-03-06 ENCOUNTER — Other Ambulatory Visit: Payer: Self-pay

## 2020-03-06 VITALS — BP 167/72 | HR 92 | Temp 98.1°F | Ht 70.0 in | Wt 157.0 lb

## 2020-03-06 DIAGNOSIS — N138 Other obstructive and reflux uropathy: Secondary | ICD-10-CM | POA: Diagnosis not present

## 2020-03-06 DIAGNOSIS — N401 Enlarged prostate with lower urinary tract symptoms: Secondary | ICD-10-CM | POA: Diagnosis not present

## 2020-03-06 DIAGNOSIS — R3912 Poor urinary stream: Secondary | ICD-10-CM

## 2020-03-06 DIAGNOSIS — M545 Low back pain, unspecified: Secondary | ICD-10-CM

## 2020-03-06 LAB — URINALYSIS, ROUTINE W REFLEX MICROSCOPIC
Bilirubin, UA: NEGATIVE
Ketones, UA: NEGATIVE
Leukocytes,UA: NEGATIVE
Nitrite, UA: NEGATIVE
Protein,UA: NEGATIVE
RBC, UA: NEGATIVE
Specific Gravity, UA: 1.01 (ref 1.005–1.030)
Urobilinogen, Ur: 0.2 mg/dL (ref 0.2–1.0)
pH, UA: 5 (ref 5.0–7.5)

## 2020-03-06 LAB — BLADDER SCAN AMB NON-IMAGING
Scan Result: 134
Scan Result: 163

## 2020-03-06 MED ORDER — CIPROFLOXACIN HCL 500 MG PO TABS
500.0000 mg | ORAL_TABLET | Freq: Once | ORAL | Status: AC
  Administered 2020-03-06: 500 mg via ORAL

## 2020-03-06 MED ORDER — TAMSULOSIN HCL 0.4 MG PO CAPS: 0.4000 mg | ORAL_CAPSULE | Freq: Every day | ORAL | 3 refills | Status: DC

## 2020-03-06 NOTE — Progress Notes (Signed)
Urological Symptom Review  Patient is experiencing the following symptoms: Frequent urination Hard to postpone urination Burning/pain with urination Stream starts and stops Trouble starting stream Have to strain to urinate Weak stream   Review of Systems  Gastrointestinal (upper)  : Negative for upper GI symptoms  Gastrointestinal (lower) : Negative for lower GI symptoms  Constitutional : Negative for symptoms  Skin: Itching  Eyes: Negative for eye symptoms  Ear/Nose/Throat : Negative for Ear/Nose/Throat symptoms  Hematologic/Lymphatic: Negative for Hematologic/Lymphatic symptoms  Cardiovascular : Negative for cardiovascular symptoms  Respiratory : Negative for respiratory symptoms  Endocrine: Negative for endocrine symptoms  Musculoskeletal: Negative for musculoskeletal symptoms  Neurological: Headaches Dizziness  Psychologic: Negative for psychiatric symptoms

## 2020-03-06 NOTE — Progress Notes (Signed)
Subjective: 1. BPH with urinary obstruction   2. Weak urinary stream   3. Bilateral low back pain, unspecified chronicity, unspecified whether sciatica present      Jeremy Patton returns today in f/u for voiding studies.  He thinks the tamsulosin that we resumed helps some.  He had a PF of 65ml/sec today with an interrupted curve and his PVR is 165ml.  He continues to have back pain with right sciatica.  UA is clear.   GU Hx: Jeremy Patton is a former patient of Dr. Gaynelle Arabian with a history of BPH with LUT and UTI's. He is sent in consultation by Dr. Willey Blade with the complaint of burning with urination with a negative UA on 12/28/19.   He has a 1-2 week history of pain with voiding.   He has to strain to void and that causes pain in his upper back and legs.  The pain resolved when he finishes voiding and he can have some pain with an awkward step.  He has no dysuria.  He has frequency 3-6x daily.  He has small voids with intermittency and he doesn't feel empty.  He has nocturia x 1.  He has no issues with urgency.   He has had no hematuria.  He has had no recent UTI's. He has had no GU surgery.   He was given Cipro by Dr. Willey Blade but his UCx was negative and he stopped the med.   He has not been on BPH meds.  He is a diabetic and has neuropathy in his feet.     ROS:  ROS  Allergies  Allergen Reactions  . Protamine     hypotension    Past Medical History:  Diagnosis Date  . Arthritis   . Carotid artery disease ([place])    a. s/p R CEA in 04/2015 b. s/p L CEA in 06/2015  . CHF (congestive heart failure) ([place])   . Diabetes mellitus without complication ([place]) [id number]  . GERD (gastroesophageal reflux disease)   . Heart murmur   . High cholesterol   . Hyperlipidemia 11/29/2011  . Hypertension 11/29/2011  . Low back pain 11/29/2011  . Lumbar radiculopathy 11/29/2011   Left  . Pain of left lower leg   . Wears glasses     Past Surgical History:  Procedure Laterality Date  . CATARACT  EXTRACTION W/ INTRAOCULAR LENS  IMPLANT, BILATERAL    . CIRCUMCISION     Age 80  . COLONOSCOPY  08/22/2005 and  07/31/2009   minimal internal hemorrhoids, left-sided diverticulosis  . COLONOSCOPY N/A 11/25/2014   Procedure: COLONOSCOPY;  Surgeon: Daneil Dolin, MD;  [facility];  Service: Endoscopy;  Laterality: N/A;  1030  . ENDARTERECTOMY Right 05/06/2015   Procedure: Right Carotid ENDARTERECTOMY ;  Surgeon: Conrad Belvidere, MD;  Location: [place];  Service: Vascular;  Laterality: Right;  . ENDARTERECTOMY Left 06/25/2015   Procedure: LEFT CAROTID ENDARTERECTOMY;  Surgeon: Conrad Sebring, MD;  Location: [place];  Service: Vascular;  Laterality: Left;  . ESOPHAGEAL DILATION N/A 11/25/2014   Procedure: ESOPHAGEAL DILATION;  Surgeon: Daneil Dolin, MD;  [facility];  Service: Endoscopy;  Laterality: N/A;  . ESOPHAGOGASTRODUODENOSCOPY N/A 11/25/2014   Procedure: ESOPHAGOGASTRODUODENOSCOPY (EGD);  Surgeon: Daneil Dolin, MD;  [facility];  Service: Endoscopy;  Laterality: N/A;  . EYE SURGERY    . MULTIPLE TOOTH EXTRACTIONS    . PATCH ANGIOPLASTY Right 05/06/2015   Procedure: PATCH ANGIOPLASTY;  Surgeon: Aaron Edelman  Starlyn Skeans, MD;  Location: [place];  Service: Vascular;  Laterality: Right;  . PATCH ANGIOPLASTY Left 06/25/2015   Procedure: PATCH ANGIOPLASTY USING XENOSURE BIOLOGIC PATCH 1cm X 6cm ;  Surgeon: Conrad Villa Park, MD;  Location: [place];  Service: Vascular;  Laterality: Left;  . TONSILLECTOMY      Social History   Socioeconomic History  . Marital status: Married    Spouse name: Not on file  . Number of children: 4  . Years of education: Not on file  . Highest education level: Not on file  Occupational History  . Occupation: retired  Tobacco Use  . Smoking status: Former Smoker    Packs/day: 2.00    Years: 30.00    Pack years: 60.00    Types: Cigarettes    Quit date: 12/17/1991    Years since quitting: 28.2  . Smokeless tobacco: Never Used  Vaping Use  . Vaping Use:  Never used  Substance and Sexual Activity  . Alcohol use: No    Alcohol/week: 0.0 standard drinks  . Drug use: No  . Sexual activity: Not on file  Other Topics Concern  . Not on file  Social History Narrative  . Not on file   Social Determinants of Health   Financial Resource Strain: Not on file  Food Insecurity: Not on file  Transportation Needs: Not on file  Physical Activity: Not on file  Stress: Not on file  Social Connections: Not on file  Intimate Partner Violence: Not on file    Family History  Problem Relation Age of Onset  . Stroke Mother   . Heart disease Mother        before age 23  . Colon cancer Brother        Age 59    Anti-infectives: Anti-infectives (From admission, onward)   None      Current Outpatient Medications  Medication Sig Dispense Refill  . acetaminophen (TYLENOL) 500 MG tablet Take 1,000 mg by mouth 2 (two) times daily.    Marland Kitchen ALPRAZolam (XANAX) 0.25 MG tablet Take 0.25 mg by mouth daily as needed.    Marland Kitchen aspirin 81 MG tablet Take 81 mg by mouth daily.    . furosemide (LASIX) 40 MG tablet Take 1 tablet (40 mg total) by mouth daily. (Patient taking differently: Take 20-40 mg by mouth 2 (two) times daily. Takes 1/2 tablet in morning and 1 in the afternoon) 30 tablet 12  . insulin lispro (HUMALOG) 100 UNIT/ML KiwkPen Inject 6 Units into the skin.    Marland Kitchen LANTUS SOLOSTAR 100 UNIT/ML Solostar Pen Inject 36 Units into the skin every morning. (Patient taking differently: Inject 30 Units into the skin every morning.) 15 mL 11  . metFORMIN (GLUCOPHAGE) 1000 MG tablet Take 1,000 mg by mouth 2 (two) times daily with a meal.    . olmesartan (BENICAR) 20 MG tablet Take 1 tablet by mouth daily.    . pantoprazole (PROTONIX) 40 MG tablet Take 40 mg by mouth daily.     . pioglitazone (ACTOS) 15 MG tablet Take 15 mg by mouth daily.    . potassium chloride SA (K-DUR,KLOR-CON) 20 MEQ tablet Take 1 tablet (20 mEq total) by mouth daily. 30 tablet 12  . rosuvastatin  (CRESTOR) 20 MG tablet Take 1 tablet (20 mg total) by mouth daily. Needs appointment for further refills. 2nd notice 30 tablet 0  . verapamil (CALAN-SR) 180 MG CR tablet Take 180 mg by mouth 2 (two) times daily.    Marland Kitchen  aspirin 120 MG suppository aspirin    . tamsulosin (FLOMAX) 0.4 MG CAPS capsule Take 1 capsule (0.4 mg total) by mouth daily. 90 capsule 3   No current facility-administered medications for this visit.     Objective: Vital signs in last 24 hours: BP (!) 167/72   Pulse 92   Temp 98.1 F (36.7 C)   Ht 5\' 10"  (1.778 m)   Wt 157 lb (71.2 kg)   BMI 22.53 kg/m   Intake/Output from previous day: No intake/output data recorded. Intake/Output this shift: @IOTHISSHIFT @   Physical Exam  Lab Results:    UA is clear.  Studies/Results: Cystoscopy:  Prepped with betadine and lidocaine Jelly.  Po Cipro given.  Urethra: Normal. External Sphincter: intact. Prostate 1.5-2cm with bilobar hyperplasia with coaptation. Bladder: No mucosal lesions. Mild trabeculation. UO's: Normal location and appearance.  Complications: none.  Assessment/Plan: BPH with BOO and back pain from straining to void with underlying lumbar DDD.    He has visual obstruction on cystoscopy with a reduced but intermittent stream and he could have a hypotonic bladder with a mildly elevated PVR.    I discussed TURP, Urolift and continued monitoring on tamsulosin.  He is going to see about getting a shot for his back and will continue the tamsulosin which I refilled.   He will return in 3 months.   Meds ordered this encounter  Medications  . tamsulosin (FLOMAX) 0.4 MG CAPS capsule    Sig: Take 1 capsule (0.4 mg total) by mouth daily.    Dispense:  90 capsule    Refill:  3     Orders Placed This Encounter  Procedures  . Urinalysis, Routine w reflex microscopic  . Bladder Scan (Post Void Residual) in office     Return in about 3 months (around 06/04/2020).    CC: Dr. Sinda Du 03/06/2020 [phone]

## 2020-03-06 NOTE — Addendum Note (Signed)
Addended by: Valentina Lucks on: 03/06/2020 02:01 PM   Modules accepted: Orders

## 2020-03-06 NOTE — Progress Notes (Signed)
Uroflow  Peak Flow: 39ml Average Flow: 9ml Voided Volume: 14ml Voiding Time: 41sec Flow Time: 20sec Time to Peak Flow: 35sec  PVR Volume: 179ml

## 2020-03-06 NOTE — Progress Notes (Signed)
Urological Symptom Review  Patient is experiencing the following symptoms: Frequent urination Hard to postpone urination Burning/pain with urination Stream starts and stops Trouble starting stream Have to strain to urinate Weak stream   Review of Systems  Gastrointestinal (upper)  : Negative for upper GI symptoms  Gastrointestinal (lower) : Negative  Constitutional : Weight loss  Skin: Itching  Eyes: Negative for eye symptoms  Ear/Nose/Throat : Negative for Ear/Nose/Throat symptoms  Hematologic/Lymphatic: Negative for Hematologic/Lymphatic symptoms  Cardiovascular : Negative for cardiovascular symptoms  Respiratory : Negative for respiratory symptoms  Endocrine: Negative for endocrine symptoms  Musculoskeletal: Negative for musculoskeletal symptoms  Neurological: Headaches Dizziness  Psychologic: Negative for psychiatric symptoms

## 2020-03-18 DIAGNOSIS — M5416 Radiculopathy, lumbar region: Secondary | ICD-10-CM | POA: Diagnosis not present

## 2020-03-25 DIAGNOSIS — M5416 Radiculopathy, lumbar region: Secondary | ICD-10-CM | POA: Diagnosis not present

## 2020-04-09 DIAGNOSIS — E1129 Type 2 diabetes mellitus with other diabetic kidney complication: Secondary | ICD-10-CM | POA: Diagnosis not present

## 2020-04-15 DIAGNOSIS — R413 Other amnesia: Secondary | ICD-10-CM | POA: Diagnosis not present

## 2020-04-15 DIAGNOSIS — I5032 Chronic diastolic (congestive) heart failure: Secondary | ICD-10-CM | POA: Diagnosis not present

## 2020-04-15 DIAGNOSIS — E1122 Type 2 diabetes mellitus with diabetic chronic kidney disease: Secondary | ICD-10-CM | POA: Diagnosis not present

## 2020-04-15 DIAGNOSIS — R700 Elevated erythrocyte sedimentation rate: Secondary | ICD-10-CM | POA: Diagnosis not present

## 2020-04-15 DIAGNOSIS — E785 Hyperlipidemia, unspecified: Secondary | ICD-10-CM | POA: Diagnosis not present

## 2020-04-15 DIAGNOSIS — I5031 Acute diastolic (congestive) heart failure: Secondary | ICD-10-CM | POA: Diagnosis not present

## 2020-04-15 DIAGNOSIS — E119 Type 2 diabetes mellitus without complications: Secondary | ICD-10-CM | POA: Diagnosis not present

## 2020-04-17 ENCOUNTER — Other Ambulatory Visit: Payer: Self-pay | Admitting: Internal Medicine

## 2020-04-17 ENCOUNTER — Other Ambulatory Visit (HOSPITAL_COMMUNITY): Payer: Self-pay | Admitting: Internal Medicine

## 2020-04-17 DIAGNOSIS — R413 Other amnesia: Secondary | ICD-10-CM

## 2020-04-21 DIAGNOSIS — I5031 Acute diastolic (congestive) heart failure: Secondary | ICD-10-CM | POA: Diagnosis not present

## 2020-04-21 DIAGNOSIS — E785 Hyperlipidemia, unspecified: Secondary | ICD-10-CM | POA: Diagnosis not present

## 2020-04-21 DIAGNOSIS — I10 Essential (primary) hypertension: Secondary | ICD-10-CM | POA: Diagnosis not present

## 2020-04-21 DIAGNOSIS — E119 Type 2 diabetes mellitus without complications: Secondary | ICD-10-CM | POA: Diagnosis not present

## 2020-04-30 ENCOUNTER — Ambulatory Visit (HOSPITAL_COMMUNITY)
Admission: RE | Admit: 2020-04-30 | Discharge: 2020-04-30 | Disposition: A | Discharge status: Home / Self Care | Payer: Medicare HMO | Source: Ambulatory Visit | Attending: Internal Medicine | Admitting: Internal Medicine

## 2020-04-30 ENCOUNTER — Other Ambulatory Visit: Payer: Self-pay

## 2020-04-30 DIAGNOSIS — I739 Peripheral vascular disease, unspecified: Secondary | ICD-10-CM | POA: Diagnosis not present

## 2020-04-30 DIAGNOSIS — R413 Other amnesia: Secondary | ICD-10-CM | POA: Insufficient documentation

## 2020-05-05 DIAGNOSIS — F419 Anxiety disorder, unspecified: Secondary | ICD-10-CM | POA: Diagnosis not present

## 2020-05-05 DIAGNOSIS — D519 Vitamin B12 deficiency anemia, unspecified: Secondary | ICD-10-CM | POA: Diagnosis not present

## 2020-05-05 DIAGNOSIS — R413 Other amnesia: Secondary | ICD-10-CM | POA: Diagnosis not present

## 2020-06-12 ENCOUNTER — Ambulatory Visit: Payer: Medicare HMO | Admitting: Urology

## 2020-06-12 DIAGNOSIS — N138 Other obstructive and reflux uropathy: Secondary | ICD-10-CM

## 2020-06-21 DIAGNOSIS — E119 Type 2 diabetes mellitus without complications: Secondary | ICD-10-CM | POA: Diagnosis not present

## 2020-06-21 DIAGNOSIS — I5031 Acute diastolic (congestive) heart failure: Secondary | ICD-10-CM | POA: Diagnosis not present

## 2020-06-21 DIAGNOSIS — I10 Essential (primary) hypertension: Secondary | ICD-10-CM | POA: Diagnosis not present

## 2020-06-21 DIAGNOSIS — E785 Hyperlipidemia, unspecified: Secondary | ICD-10-CM | POA: Diagnosis not present

## 2020-07-22 DIAGNOSIS — I5032 Chronic diastolic (congestive) heart failure: Secondary | ICD-10-CM | POA: Diagnosis not present

## 2020-07-22 DIAGNOSIS — Z79899 Other long term (current) drug therapy: Secondary | ICD-10-CM | POA: Diagnosis not present

## 2020-07-22 DIAGNOSIS — E1129 Type 2 diabetes mellitus with other diabetic kidney complication: Secondary | ICD-10-CM | POA: Diagnosis not present

## 2020-07-22 DIAGNOSIS — R413 Other amnesia: Secondary | ICD-10-CM | POA: Diagnosis not present

## 2020-07-22 DIAGNOSIS — I10 Essential (primary) hypertension: Secondary | ICD-10-CM | POA: Diagnosis not present

## 2020-07-24 DIAGNOSIS — I5032 Chronic diastolic (congestive) heart failure: Secondary | ICD-10-CM | POA: Diagnosis not present

## 2020-07-24 DIAGNOSIS — D519 Vitamin B12 deficiency anemia, unspecified: Secondary | ICD-10-CM | POA: Diagnosis not present

## 2020-07-24 DIAGNOSIS — R7309 Other abnormal glucose: Secondary | ICD-10-CM | POA: Diagnosis not present

## 2020-07-24 DIAGNOSIS — E1122 Type 2 diabetes mellitus with diabetic chronic kidney disease: Secondary | ICD-10-CM | POA: Diagnosis not present

## 2020-07-24 DIAGNOSIS — I10 Essential (primary) hypertension: Secondary | ICD-10-CM | POA: Diagnosis not present

## 2020-09-21 DIAGNOSIS — E119 Type 2 diabetes mellitus without complications: Secondary | ICD-10-CM | POA: Diagnosis not present

## 2020-09-21 DIAGNOSIS — I10 Essential (primary) hypertension: Secondary | ICD-10-CM | POA: Diagnosis not present

## 2020-09-21 DIAGNOSIS — I5031 Acute diastolic (congestive) heart failure: Secondary | ICD-10-CM | POA: Diagnosis not present

## 2020-09-21 DIAGNOSIS — E785 Hyperlipidemia, unspecified: Secondary | ICD-10-CM | POA: Diagnosis not present

## 2020-10-21 DIAGNOSIS — Z79899 Other long term (current) drug therapy: Secondary | ICD-10-CM | POA: Diagnosis not present

## 2020-10-21 DIAGNOSIS — E1129 Type 2 diabetes mellitus with other diabetic kidney complication: Secondary | ICD-10-CM | POA: Diagnosis not present

## 2020-10-21 DIAGNOSIS — I5033 Acute on chronic diastolic (congestive) heart failure: Secondary | ICD-10-CM | POA: Diagnosis not present

## 2020-10-21 DIAGNOSIS — I10 Essential (primary) hypertension: Secondary | ICD-10-CM | POA: Diagnosis not present

## 2020-10-21 DIAGNOSIS — E539 Vitamin B deficiency, unspecified: Secondary | ICD-10-CM | POA: Diagnosis not present

## 2020-10-24 DIAGNOSIS — E1129 Type 2 diabetes mellitus with other diabetic kidney complication: Secondary | ICD-10-CM | POA: Diagnosis not present

## 2020-10-24 DIAGNOSIS — I5031 Acute diastolic (congestive) heart failure: Secondary | ICD-10-CM | POA: Diagnosis not present

## 2020-10-24 DIAGNOSIS — D519 Vitamin B12 deficiency anemia, unspecified: Secondary | ICD-10-CM | POA: Diagnosis not present

## 2020-10-24 DIAGNOSIS — R7309 Other abnormal glucose: Secondary | ICD-10-CM | POA: Diagnosis not present

## 2020-10-28 ENCOUNTER — Encounter: Payer: Self-pay | Admitting: *Deleted

## 2020-11-13 ENCOUNTER — Ambulatory Visit: Payer: Medicare HMO

## 2020-11-24 ENCOUNTER — Ambulatory Visit: Payer: Medicare HMO

## 2021-01-29 DIAGNOSIS — I10 Essential (primary) hypertension: Secondary | ICD-10-CM | POA: Diagnosis not present

## 2021-01-29 DIAGNOSIS — E1129 Type 2 diabetes mellitus with other diabetic kidney complication: Secondary | ICD-10-CM | POA: Diagnosis not present

## 2021-01-29 DIAGNOSIS — D519 Vitamin B12 deficiency anemia, unspecified: Secondary | ICD-10-CM | POA: Diagnosis not present

## 2021-01-29 DIAGNOSIS — Z79899 Other long term (current) drug therapy: Secondary | ICD-10-CM | POA: Diagnosis not present

## 2021-01-29 DIAGNOSIS — I5033 Acute on chronic diastolic (congestive) heart failure: Secondary | ICD-10-CM | POA: Diagnosis not present

## 2021-02-04 DIAGNOSIS — E1122 Type 2 diabetes mellitus with diabetic chronic kidney disease: Secondary | ICD-10-CM | POA: Diagnosis not present

## 2021-02-04 DIAGNOSIS — E785 Hyperlipidemia, unspecified: Secondary | ICD-10-CM | POA: Diagnosis not present

## 2021-02-04 DIAGNOSIS — R7309 Other abnormal glucose: Secondary | ICD-10-CM | POA: Diagnosis not present

## 2021-02-04 DIAGNOSIS — I5032 Chronic diastolic (congestive) heart failure: Secondary | ICD-10-CM | POA: Diagnosis not present

## 2021-02-20 DIAGNOSIS — E119 Type 2 diabetes mellitus without complications: Secondary | ICD-10-CM | POA: Diagnosis not present

## 2021-02-20 DIAGNOSIS — I10 Essential (primary) hypertension: Secondary | ICD-10-CM | POA: Diagnosis not present

## 2021-02-20 DIAGNOSIS — I501 Left ventricular failure: Secondary | ICD-10-CM | POA: Diagnosis not present

## 2021-02-20 DIAGNOSIS — E785 Hyperlipidemia, unspecified: Secondary | ICD-10-CM | POA: Diagnosis not present

## 2021-03-23 ENCOUNTER — Other Ambulatory Visit: Payer: Self-pay | Admitting: Urology

## 2021-04-02 ENCOUNTER — Ambulatory Visit: Payer: Medicare HMO | Admitting: Urology

## 2021-04-27 DIAGNOSIS — I10 Essential (primary) hypertension: Secondary | ICD-10-CM | POA: Diagnosis not present

## 2021-04-27 DIAGNOSIS — E119 Type 2 diabetes mellitus without complications: Secondary | ICD-10-CM | POA: Diagnosis not present

## 2021-05-06 DIAGNOSIS — E785 Hyperlipidemia, unspecified: Secondary | ICD-10-CM | POA: Diagnosis not present

## 2021-05-06 DIAGNOSIS — Z79899 Other long term (current) drug therapy: Secondary | ICD-10-CM | POA: Diagnosis not present

## 2021-05-06 DIAGNOSIS — E1129 Type 2 diabetes mellitus with other diabetic kidney complication: Secondary | ICD-10-CM | POA: Diagnosis not present

## 2021-05-06 DIAGNOSIS — I5032 Chronic diastolic (congestive) heart failure: Secondary | ICD-10-CM | POA: Diagnosis not present

## 2021-06-22 ENCOUNTER — Encounter (HOSPITAL_COMMUNITY): Payer: Self-pay | Admitting: *Deleted

## 2021-06-22 ENCOUNTER — Encounter: Payer: Self-pay | Admitting: Emergency Medicine

## 2021-06-22 ENCOUNTER — Other Ambulatory Visit: Payer: Self-pay

## 2021-06-22 ENCOUNTER — Emergency Department (HOSPITAL_COMMUNITY)
Admission: EM | Admit: 2021-06-22 | Discharge: 2021-06-22 | Disposition: A | Discharge status: Home / Self Care | Payer: Medicare PPO | Attending: Emergency Medicine | Admitting: Emergency Medicine

## 2021-06-22 ENCOUNTER — Emergency Department (HOSPITAL_COMMUNITY): Payer: Medicare PPO

## 2021-06-22 ENCOUNTER — Ambulatory Visit
Admission: EM | Admit: 2021-06-22 | Discharge: 2021-06-22 | Disposition: A | Discharge status: Nursing Home (Includes ICF) | Payer: Medicare PPO | Attending: Family Medicine | Admitting: Family Medicine

## 2021-06-22 DIAGNOSIS — E119 Type 2 diabetes mellitus without complications: Secondary | ICD-10-CM | POA: Insufficient documentation

## 2021-06-22 DIAGNOSIS — I509 Heart failure, unspecified: Secondary | ICD-10-CM | POA: Diagnosis not present

## 2021-06-22 DIAGNOSIS — I110 Hypertensive heart disease with heart failure: Secondary | ICD-10-CM | POA: Diagnosis not present

## 2021-06-22 DIAGNOSIS — R5383 Other fatigue: Secondary | ICD-10-CM | POA: Diagnosis not present

## 2021-06-22 DIAGNOSIS — Z794 Long term (current) use of insulin: Secondary | ICD-10-CM | POA: Insufficient documentation

## 2021-06-22 DIAGNOSIS — R8281 Pyuria: Secondary | ICD-10-CM | POA: Diagnosis not present

## 2021-06-22 DIAGNOSIS — Z79899 Other long term (current) drug therapy: Secondary | ICD-10-CM | POA: Insufficient documentation

## 2021-06-22 DIAGNOSIS — R531 Weakness: Secondary | ICD-10-CM | POA: Diagnosis not present

## 2021-06-22 DIAGNOSIS — R42 Dizziness and giddiness: Secondary | ICD-10-CM | POA: Diagnosis not present

## 2021-06-22 DIAGNOSIS — Z7982 Long term (current) use of aspirin: Secondary | ICD-10-CM | POA: Diagnosis not present

## 2021-06-22 DIAGNOSIS — Z7984 Long term (current) use of oral hypoglycemic drugs: Secondary | ICD-10-CM | POA: Diagnosis not present

## 2021-06-22 DIAGNOSIS — R300 Dysuria: Secondary | ICD-10-CM | POA: Diagnosis not present

## 2021-06-22 DIAGNOSIS — I672 Cerebral atherosclerosis: Secondary | ICD-10-CM | POA: Diagnosis not present

## 2021-06-22 DIAGNOSIS — R634 Abnormal weight loss: Secondary | ICD-10-CM

## 2021-06-22 DIAGNOSIS — I2510 Atherosclerotic heart disease of native coronary artery without angina pectoris: Secondary | ICD-10-CM | POA: Diagnosis not present

## 2021-06-22 LAB — URINALYSIS, ROUTINE W REFLEX MICROSCOPIC
Bacteria, UA: NONE SEEN
Bilirubin Urine: NEGATIVE
Glucose, UA: >=500 mg/dL — AB
Hgb urine dipstick: NEGATIVE
Ketones, ur: NEGATIVE mg/dL
Nitrite: NEGATIVE
Protein, ur: NEGATIVE mg/dL
Specific Gravity, Urine: 1.011 (ref 1.005–1.030)
pH: 5 (ref 5.0–8.0)

## 2021-06-22 LAB — CBC
HCT: 41.4 % (ref 39.0–52.0)
Hemoglobin: 13 g/dL (ref 13.0–17.0)
MCH: 27.3 pg (ref 26.0–34.0)
MCHC: 31.4 g/dL (ref 30.0–36.0)
MCV: 86.8 fL (ref 80.0–100.0)
Platelets: 145 10*3/uL — ABNORMAL LOW (ref 150–400)
RBC: 4.77 MIL/uL (ref 4.22–5.81)
RDW: 15.3 % (ref 11.5–15.5)
WBC: 5.6 10*3/uL (ref 4.0–10.5)
nRBC: 0 % (ref 0.0–0.2)

## 2021-06-22 LAB — BASIC METABOLIC PANEL
Anion gap: 10 (ref 5–15)
BUN: 27 mg/dL — ABNORMAL HIGH (ref 8–23)
CO2: 23 mmol/L (ref 22–32)
Calcium: 9.5 mg/dL (ref 8.9–10.3)
Chloride: 109 mmol/L (ref 98–111)
Creatinine, Ser: 1.14 mg/dL (ref 0.61–1.24)
GFR, Estimated: >60 mL/min (ref >60)
Glucose, Bld: 117 mg/dL — ABNORMAL HIGH (ref 70–99)
Potassium: 4.3 mmol/L (ref 3.5–5.1)
Sodium: 142 mmol/L (ref 135–145)

## 2021-06-22 LAB — HEPATIC FUNCTION PANEL
ALT: 16 U/L (ref 0–44)
AST: 18 U/L (ref 15–41)
Albumin: 3.8 g/dL (ref 3.5–5.0)
Alkaline Phosphatase: 51 U/L (ref 38–126)
Bilirubin, Direct: 0.1 mg/dL (ref 0.0–0.2)
Indirect Bilirubin: 0.4 mg/dL (ref 0.3–0.9)
Total Bilirubin: 0.5 mg/dL (ref 0.3–1.2)
Total Protein: 7.3 g/dL (ref 6.5–8.1)

## 2021-06-22 LAB — MAGNESIUM: Magnesium: 2 mg/dL (ref 1.7–2.4)

## 2021-06-22 LAB — POCT FASTING CBG KUC MANUAL ENTRY: POCT Glucose (KUC): 101 mg/dL — AB (ref 70–99)

## 2021-06-22 LAB — BRAIN NATRIURETIC PEPTIDE: B Natriuretic Peptide: 229 pg/mL — ABNORMAL HIGH (ref 0.0–100.0)

## 2021-06-22 LAB — TSH: TSH: 1.467 u[IU]/mL (ref 0.350–4.500)

## 2021-06-22 LAB — TROPONIN I (HIGH SENSITIVITY)
Troponin I (High Sensitivity): 10 ng/L (ref <18)
Troponin I (High Sensitivity): 10 ng/L (ref <18)

## 2021-06-22 MED ORDER — LACTATED RINGERS IV BOLUS
500.0000 mL | Freq: Once | INTRAVENOUS | Status: AC
  Administered 2021-06-22: 500 mL via INTRAVENOUS

## 2021-06-22 MED ORDER — CEPHALEXIN 500 MG PO CAPS: 500.0000 mg | ORAL_CAPSULE | Freq: Three times a day (TID) | ORAL | 0 refills | Status: AC

## 2021-06-22 NOTE — ED Provider Notes (Signed)
?[facility] ?Provider Note ? ? ?CSN: [id number] ?Arrival date & time: 06/22/21  1424 ? ?  ? ?History ? ?Chief Complaint  ?Patient presents with  ? Weakness  ? ? ?Jeremy Patton is a 81 y.o. male. ? ?HPI ?Patient presents for fatigue.  This has been ongoing for several months.  He was seen in urgent care but advised to come to the ED.  His medical history includes CAD, GERD, diverticulosis, HTN, HLD, T2DM, CHF, carotid artery stenosis, and arthritis.  Patient's daughter reports that he has seemed off balance at times.  She also reports that, at urgent care, they did check orthostatic vital signs and he did have a 15 point drop in his SBP with standing.  They also performed EKG.  No other testing was performed to urgent care.  Patient does endorse recent weight loss despite good appetite.  He has had some recent dysuria but denies any other areas of pain or discomfort.  He has not had any trouble breathing. ? ?Prior to Admission medications   ?Medication Sig Start Date End Date Taking? Authorizing Provider  ?cephALEXin (KEFLEX) 500 MG capsule Take 1 capsule (500 mg total) by mouth 3 (three) times daily for 5 days. 06/22/21 06/27/21 Yes Godfrey Pick, MD  ?acetaminophen (TYLENOL) 500 MG tablet Take 1,000 mg by mouth 2 (two) times daily.    [provider]  ?ALPRAZolam Duanne Moron) 0.25 MG tablet Take 0.25 mg by mouth daily as needed. 12/24/19   [provider]  ?aspirin 120 MG suppository aspirin    [provider]  ?aspirin 81 MG tablet Take 81 mg by mouth daily.    [provider]  ?furosemide (LASIX) 40 MG tablet Take 1 tablet (40 mg total) by mouth daily. ?Patient taking differently: Take 20-40 mg by mouth 2 (two) times daily. Takes 1/2 tablet in morning and 1 in the afternoon 06/10/16   Asencion Noble, MD  ?insulin lispro (HUMALOG) 100 UNIT/ML KiwkPen Inject 6 Units into the skin.    [provider]  ?JARDIANCE 10 MG TABS tablet Take 10 mg by mouth daily. 05/20/21    [provider]  ?LANTUS SOLOSTAR 100 UNIT/ML Solostar Pen Inject 36 Units into the skin every morning. ?Patient taking differently: Inject 30 Units into the skin every morning. 06/14/16   Dhungel, Flonnie Overman, MD  ?metFORMIN (GLUCOPHAGE) 1000 MG tablet Take 1,000 mg by mouth 2 (two) times daily with a meal.    [provider]  ?olmesartan (BENICAR) 20 MG tablet Take 1 tablet by mouth daily. 08/13/17   [provider]  ?pantoprazole (PROTONIX) 40 MG tablet Take 40 mg by mouth daily.  04/18/15   [provider]  ?pioglitazone (ACTOS) 15 MG tablet Take 15 mg by mouth daily.    [provider]  ?potassium chloride SA (K-DUR,KLOR-CON) 20 MEQ tablet Take 1 tablet (20 mEq total) by mouth daily. 06/10/16   Asencion Noble, MD  ?rosuvastatin (CRESTOR) 20 MG tablet Take 1 tablet (20 mg total) by mouth daily. Needs appointment for further refills. 2nd notice 06/28/16   Troy Sine, MD  ?sertraline (ZOLOFT) 25 MG tablet Take 25 mg by mouth daily. 05/20/21   [provider]  ?tamsulosin (FLOMAX) 0.4 MG CAPS capsule TAKE ONE CAPSULE BY MOUTH ONCE DAILY. 03/26/21   Irine Seal, MD  ?verapamil (CALAN-SR) 180 MG CR tablet Take 180 mg by mouth 2 (two) times daily.    [provider]  ?   ? ?Allergies    ?  Protamine   ? ?Review of Systems   ?Review of Systems  ?Constitutional:  Positive for fatigue and unexpected weight change.  ?Genitourinary:  Positive for dysuria.  ?All other systems reviewed and are negative. ? ?Physical Exam ?Updated Vital Signs ?BP (!) 127/58   Pulse 80   Temp 97.8 ?F (36.6 ?C) (Oral)   Resp 18   Ht '5\' 10"'$  (1.778 m)   Wt 63 kg   SpO2 99%   BMI 19.93 kg/m?  ?Physical Exam ?Vitals and nursing note reviewed.  ?Constitutional:   ?   General: He is not in acute distress. ?   Appearance: Normal appearance. He is well-developed. He is not ill-appearing, toxic-appearing or diaphoretic.  ?HENT:  ?   Head: Normocephalic and atraumatic.  ?   Right Ear: External ear  normal.  ?   Left Ear: External ear normal.  ?   Nose: Nose normal.  ?   Mouth/Throat:  ?   Mouth: Mucous membranes are moist.  ?   Pharynx: Oropharynx is clear.  ?Eyes:  ?   General: No scleral icterus. ?   Extraocular Movements: Extraocular movements intact.  ?   Conjunctiva/sclera: Conjunctivae normal.  ?Cardiovascular:  ?   Rate and Rhythm: Normal rate and regular rhythm.  ?   Heart sounds: No murmur heard. ?Pulmonary:  ?   Effort: Pulmonary effort is normal. No respiratory distress.  ?   Breath sounds: Normal breath sounds. No wheezing or rales.  ?Abdominal:  ?   Palpations: Abdomen is soft.  ?   Tenderness: There is no abdominal tenderness.  ?Musculoskeletal:     ?   General: No swelling. Normal range of motion.  ?   Cervical back: Normal range of motion and neck supple. No rigidity.  ?   Right lower leg: No edema.  ?   Left lower leg: No edema.  ?Skin: ?   General: Skin is warm and dry.  ?   Capillary Refill: Capillary refill takes less than 2 seconds.  ?   Coloration: Skin is not jaundiced or pale.  ?Neurological:  ?   General: No focal deficit present.  ?   Mental Status: He is alert and oriented to person, place, and time.  ?   Cranial Nerves: No cranial nerve deficit.  ?   Sensory: No sensory deficit.  ?   Motor: No weakness.  ?   Coordination: Coordination normal.  ?Psychiatric:     ?   Mood and Affect: Mood normal.     ?   Behavior: Behavior normal.     ?   Thought Content: Thought content normal.     ?   Judgment: Judgment normal.  ? ? ?ED Results / Procedures / Treatments   ?Labs ?(all labs ordered are listed, but only abnormal results are displayed) ?Labs Reviewed  ?BASIC METABOLIC PANEL - Abnormal; Notable for the following components:  ?    Result Value  ? Glucose, Bld 117 (*)   ? BUN 27 (*)   ? All other components within normal limits  ?CBC - Abnormal; Notable for the following components:  ? Platelets 145 (*)   ? All other components within normal limits  ?URINALYSIS, ROUTINE W REFLEX  MICROSCOPIC - Abnormal; Notable for the following components:  ? Color, Urine STRAW (*)   ? Glucose, UA >=500 (*)   ? Leukocytes,Ua TRACE (*)   ? All other components within normal limits  ?BRAIN NATRIURETIC PEPTIDE - Abnormal; Notable for the following  components:  ? B Natriuretic Peptide 229.0 (*)   ? All other components within normal limits  ?URINE CULTURE  ?HEPATIC FUNCTION PANEL  ?MAGNESIUM  ?TSH  ?TROPONIN I (HIGH SENSITIVITY)  ?TROPONIN I (HIGH SENSITIVITY)  ? ? ?EKG ?EKG Interpretation ? ?Date/Time:  Monday Jun 22 2021 18:23:03 EDT ?Ventricular Rate:  79 ?PR Interval:  138 ?QRS Duration: 110 ?QT Interval:  398 ?QTC Calculation: 456 ?R Axis:   -8 ?Text Interpretation: Sinus rhythm with occasional Premature ventricular complexes Abnormal ECG Confirmed by Godfrey Pick [phone]) on 06/22/2021 6:59:36 PM ? ?Radiology ?DG Chest 1 View ? ?Result Date: 06/22/2021 ?CLINICAL DATA:  Generalized weakness EXAM: CHEST  1 VIEW COMPARISON:  None. FINDINGS: The heart size and mediastinal contours are within normal limits. Aortic atherosclerosis. Both lungs are clear. The visualized skeletal structures are unremarkable. IMPRESSION: No active disease. Electronically Signed   By: Donavan Foil M.D.   On: 06/22/2021 17:58  ? ?CT Head Wo Contrast ? ?Result Date: 06/22/2021 ?CLINICAL DATA:  Dizziness, persistent/recurrent, cardiac or vascular cause suspected. EXAM: CT HEAD WITHOUT CONTRAST TECHNIQUE: Contiguous axial images were obtained from the base of the skull through the vertex without intravenous contrast. RADIATION DOSE REDUCTION: This exam was performed according to the departmental dose-optimization program which includes automated exposure control, adjustment of the mA and/or kV according to patient size and/or use of iterative reconstruction technique. COMPARISON:  Head MRI 04/30/2020 FINDINGS: Brain: There is no evidence of an acute infarct, intracranial hemorrhage, mass, midline shift, or extra-axial fluid collection. Mild to  moderate cerebral atrophy is unchanged. Hypodensities in the cerebral white matter are nonspecific but compatible with minimal chronic small vessel ischemic disease. Vascular: Calcified atherosclerosis at the skull ba

## 2021-06-22 NOTE — ED Notes (Signed)
Pt given water and sandwhich bag ?

## 2021-06-22 NOTE — ED Triage Notes (Signed)
Pt reports generalized weakness and intermittent dizziness with position changes x3 weeks.  ? ?Pt denies any pain, shortness of breath, gi/gu, numbness/tingling.  ?

## 2021-06-22 NOTE — Discharge Instructions (Signed)
Commend that you go to the emergency department for further evaluation.  Establish care with a primary care provider soon as possible for good outpatient follow-up. ?

## 2021-06-22 NOTE — ED Triage Notes (Signed)
Pt c/o generalized weakness and dizziness x one year but states it has gotten worse in the last 3 months; pt was seen at urgent care and told to come to ED ?

## 2021-06-22 NOTE — Discharge Instructions (Signed)
A prescription for an antibiotic was sent to your pharmacy.  This is for treatment of urinary tract infection.  A urine culture was sent.  This is a lab test that will grow bacteria if a true urine infection is present.  If bacteria grow, they can test antibiotics against the bacteria to determine best medicine.  You should follow-up with your primary care doctor regarding results of urine culture.  When you follow-up with your primary care doctor, also discussed these ongoing symptoms that you have been having and any further testing that might be of benefit to you.  Return to the ED anytime if you experience any new or worsening symptoms. ?

## 2021-06-22 NOTE — ED Notes (Addendum)
Patient is being discharged from the Urgent Care and sent to the Emergency Department via POV . Per PA, patient is in need of higher level of care due to Generalized Weakness. Patient is aware and verbalizes understanding of plan of care.  ?Vitals:  ? 06/22/21 1337  ?BP: 110/61  ?Pulse: 98  ?Resp: 18  ?Temp: 98.7 ?F (37.1 ?C)  ?SpO2: 96%  ? ? ?

## 2021-06-25 LAB — URINE CULTURE: Culture: 30000 — AB

## 2021-06-26 ENCOUNTER — Telehealth: Payer: Self-pay | Admitting: *Deleted

## 2021-06-26 NOTE — Progress Notes (Signed)
ED Antimicrobial Stewardship Positive Culture Follow Up  ? ?Jeremy Patton is an 81 y.o. male who presented to [facility] on 06/22/2021 with a chief complaint of  ?Chief Complaint  ?Patient presents with  ? Weakness  ? ? ?Recent Results (from the past 720 hour(s))  ?Urine Culture     Status: Abnormal  ? Collection Time: 06/22/21  5:56 PM  ? Specimen: Urine, Clean Catch  ?Result Value Ref Range Status  ? Specimen Description   Final  ?  URINE, CLEAN CATCH ?Performed at [facility], [address] [place], [place] [address] ?  ? Special Requests   Final  ?  NONE ?Performed at [facility], [address], [place], [place] [address] ?  ? Culture 30,000 COLONIES/mL STAPHYLOCOCCUS EPIDERMIDIS (A)  Final  ? Report Status 06/25/2021 FINAL  Final  ? Organism ID, Bacteria STAPHYLOCOCCUS EPIDERMIDIS (A)  Final  ?    Susceptibility  ? Staphylococcus epidermidis - MIC*  ?  CIPROFLOXACIN <=0.5 SENSITIVE Sensitive   ?  GENTAMICIN <=0.5 SENSITIVE Sensitive   ?  NITROFURANTOIN 128 RESISTANT Resistant   ?  OXACILLIN >=4 RESISTANT Resistant   ?  TETRACYCLINE 4 SENSITIVE Sensitive   ?  TRIMETH/SULFA <=10 SENSITIVE Sensitive   ?  CLINDAMYCIN >=8 RESISTANT Resistant   ?  RIFAMPIN >=32 RESISTANT Resistant   ?  Inducible Clindamycin NEGATIVE Sensitive   ?  * 30,000 COLONIES/mL STAPHYLOCOCCUS EPIDERMIDIS  ? ? ?Plan: Call for sx check, if + urinary sx and not doing any better d/c keflex and give doxycycline  ? ?ED Provider: Myna Bright, PA-C ? ? ?Bertis Ruddy, PharmD ?Clinical Pharmacist ?ED Pharmacist Phone # [phone] ?06/26/2021 8:00 AM ? ? ?

## 2021-06-26 NOTE — Telephone Encounter (Signed)
Post ED Visit - Positive Culture Follow-up: Unsuccessful Patient Follow-up ? ?Culture assessed and recommendations reviewed by: ? ?'[]'$  Elenor Quinones, Pharm.D. ?'[]'$  Heide Guile, Pharm.D., BCPS AQ-ID ?'[]'$  Parks Neptune, Pharm.D., BCPS ?'[]'$  Alycia Rossetti, Pharm.D., BCPS ?'[]'$  [place], Pharm.D., BCPS, AAHIVP ?'[]'$  Legrand Como, Pharm.D., BCPS, AAHIVP ?'[]'$  Wynell Balloon, PharmD ?'[]'$  Vincenza Hews, PharmD, BCPS ? ?Positive urine culture ? ?'[]'$  Patient discharged without antimicrobial prescription and treatment is now indicated ?'[x]'$  Organism is resistant to prescribed ED discharge antimicrobial ?'[]'$  Patient with positive blood cultures ? ?Plan:  If (+) urinary sx, d/c Keflex and give Doxycycline '100mg'$  PO BID x 7 days, Myna Bright, PA-C ?Unable to contact patient after 3 attempts, letter will be sent to address on file ? ?Ardeen Fillers ?06/26/2021, 10:35 AM  ?

## 2021-06-26 NOTE — ED Provider Notes (Signed)
?[facility] ? ? ? ?CSN: [id number] ?Arrival date & time: 06/22/21  1301 ? ? ?  ? ?History   ?Chief Complaint ?Chief Complaint  ?Patient presents with  ? Weakness  ? ? ?HPI ?Jeremy Patton is a 81 y.o. male.  ? ?Patient presenting today for 1-3 months of persistent generalized weakness, fatigue, dizziness worse with positional change, and 10 lb or more weight loss. He denies CP, SOB, edema, fever, chills, abdominal pain, N/V/D, mental status changes. No new medications, sick contacts, history of similar episodes. Has had a good appetite and eating at baseline. PMHx significant for HTN, CAD, DM2, CHF, HLD. Does not currently have a PCP.  ? ? ?Past Medical History:  ?Diagnosis Date  ? Arthritis   ? Carotid artery disease ([place])   ? a. s/p R CEA in 04/2015 b. s/p L CEA in 06/2015  ? CHF (congestive heart failure) ([place])   ? Diabetes mellitus without complication ([place]) 94/09/5460  ? GERD (gastroesophageal reflux disease)   ? Heart murmur   ? High cholesterol   ? Hyperlipidemia 11/29/2011  ? Hypertension 11/29/2011  ? Low back pain 11/29/2011  ? Lumbar radiculopathy 11/29/2011  ? Left  ? Pain of left lower leg   ? Wears glasses   ? ? ?Patient Active Problem List  ? Diagnosis Date Noted  ? Bowel habit changes 08/17/2017  ? Diarrhea 08/17/2017  ? AKI (acute kidney injury) ([place])   ? Acute on chronic diastolic CHF (congestive heart failure) ([place]) 06/12/2016  ? HCAP (healthcare-associated pneumonia) 06/12/2016  ? CHF exacerbation ([place]) 06/08/2016  ? Acute respiratory failure with hypoxia ([place]) 06/08/2016  ? Left carotid artery stenosis 06/25/2015  ? Preoperative clearance 05/05/2015  ? Carotid artery disease ([place]) 05/01/2015  ? Dizziness 05/01/2015  ? Essential hypertension 05/01/2015  ? Hyperlipidemia LDL goal <70 05/01/2015  ? Type 2 diabetes mellitus with circulatory disorder, with long-term current use of insulin ([place]) 05/01/2015  ? History of colonic polyps   ? Diverticulosis of colon without hemorrhage   ?  Reflux esophagitis   ? Mucosal abnormality of stomach   ? GERD (gastroesophageal reflux disease) 11/01/2014  ? Esophageal dysphagia 11/01/2014  ? FH: colon cancer in first degree relative <71 years old 11/01/2014  ? ? ?Past Surgical History:  ?Procedure Laterality Date  ? CATARACT EXTRACTION W/ INTRAOCULAR LENS  IMPLANT, BILATERAL    ? CIRCUMCISION    ? Age 28  ? COLONOSCOPY  08/22/2005 and  07/31/2009  ? minimal internal hemorrhoids, left-sided diverticulosis  ? COLONOSCOPY N/A 11/25/2014  ? Procedure: COLONOSCOPY;  Surgeon: Daneil Dolin, MD;  [facility];  Service: Endoscopy;  Laterality: N/A;  1030  ? ENDARTERECTOMY Right 05/06/2015  ? Procedure: Right Carotid ENDARTERECTOMY ;  Surgeon: Conrad Palmhurst, MD;  Location: [facility];  Service: Vascular;  Laterality: Right;  ? ENDARTERECTOMY Left 06/25/2015  ? Procedure: LEFT CAROTID ENDARTERECTOMY;  Surgeon: Conrad Eagle, MD;  Location: [facility];  Service: Vascular;  Laterality: Left;  ? ESOPHAGEAL DILATION N/A 11/25/2014  ? Procedure: ESOPHAGEAL DILATION;  Surgeon: Daneil Dolin, MD;  [facility];  Service: Endoscopy;  Laterality: N/A;  ? ESOPHAGOGASTRODUODENOSCOPY N/A 11/25/2014  ? Procedure: ESOPHAGOGASTRODUODENOSCOPY (EGD);  Surgeon: Daneil Dolin, MD;  [facility];  Service: Endoscopy;  Laterality: N/A;  ? EYE SURGERY    ? MULTIPLE TOOTH EXTRACTIONS    ? PATCH ANGIOPLASTY Right 05/06/2015  ? Procedure: PATCH ANGIOPLASTY;  Surgeon: Conrad Pun[place], MD;  Location[facility];  Service: Vascular;  Laterality: Right;  ? PATCH ANGIOPLASTY Left 06/25/2015  ? Procedure: PATCH ANGIOPLASTY USING XENOSURE BIOLOGIC PATCH 1cm X 6cm ;  Surgeon: Conrad Ravenden, MD;  Location: [facility];  Service: Vascular;  Laterality: Left;  ? TONSILLECTOMY    ? ? ? ? ? ?Home Medications   ? ?Prior to Admission medications   ?Medication Sig Start Date End Date Taking? Authorizing Provider  ?acetaminophen (TYLENOL) 500 MG tablet Take 1,000 mg by mouth 2 (two) times daily.    [provider]  ?ALPRAZolam Duanne Moron) 0.25 MG tablet Take 0.25 mg by mouth daily as needed. 12/24/19   [provider]  ?aspirin 120 MG suppository aspirin    [provider]  ?aspirin 81 MG tablet Take 81 mg by mouth daily.    [provider]  ?cephALEXin (KEFLEX) 500 MG capsule Take 1 capsule (500 mg total) by mouth 3 (three) times daily for 5 days. 06/22/21 06/27/21  Godfrey Pick, MD  ?furosemide (LASIX) 40 MG tablet Take 1 tablet (40 mg total) by mouth daily. ?Patient taking differently: Take 20-40 mg by mouth 2 (two) times daily. Takes 1/2 tablet in morning and 1 in the afternoon 06/10/16   Asencion Noble, MD  ?insulin lispro (HUMALOG) 100 UNIT/ML KiwkPen Inject 6 Units into the skin.    [provider]  ?JARDIANCE 10 MG TABS tablet Take 10 mg by mouth daily. 05/20/21   [provider]  ?LANTUS SOLOSTAR 100 UNIT/ML Solostar Pen Inject 36 Units into the skin every morning. ?Patient taking differently: Inject 30 Units into the skin every morning. 06/14/16   Dhungel, Flonnie Overman, MD  ?metFORMIN (GLUCOPHAGE) 1000 MG tablet Take 1,000 mg by mouth 2 (two) times daily with a meal.    [provider]  ?olmesartan (BENICAR) 20 MG tablet Take 1 tablet by mouth daily. 08/13/17   [provider]  ?pantoprazole (PROTONIX) 40 MG tablet Take 40 mg by mouth daily.  04/18/15   [provider]  ?pioglitazone (ACTOS) 15 MG tablet Take 15 mg by mouth daily.    [provider]  ?potassium chloride SA (K-DUR,KLOR-CON) 20 MEQ tablet Take 1 tablet (20 mEq total) by mouth daily. 06/10/16   Asencion Noble, MD  ?rosuvastatin (CRESTOR) 20 MG tablet Take 1 tablet (20 mg total) by mouth daily. Needs appointment for further refills. 2nd notice 06/28/16   Troy Sine, MD  ?sertraline (ZOLOFT) 25 MG tablet Take 25 mg by mouth daily. 05/20/21   [provider]  ?tamsulosin (FLOMAX) 0.4 MG CAPS capsule TAKE ONE CAPSULE BY MOUTH ONCE DAILY. 03/26/21   Irine Seal, MD  ?verapamil  (CALAN-SR) 180 MG CR tablet Take 180 mg by mouth 2 (two) times daily.    [provider]  ? ? ?Family History ?Family History  ?Problem Relation Age of Onset  ? Stroke Mother   ? Heart disease Mother   ?     before age 72  ? Colon cancer Brother   ?     Age 69  ? ? ?Social History ?Social History  ? ?Tobacco Use  ? Smoking status: Former  ?  Packs/day: 2.00  ?  Years: 30.00  ?  Pack years: 60.00  ?  Types: Cigarettes  ?  Quit date: 12/17/1991  ?  Years since quitting: 29.5  ? Smokeless tobacco: Never  ?Vaping Use  ? Vaping Use: Never used  ?Substance Use Topics  ? Alcohol use: No  ?  Alcohol/week: 0.0 standard drinks  ? Drug use: No  ? ? ? ?Allergies   ?Protamine ? ? ?Review of Systems ?Review of Systems ?PER HPI ? ?Physical Exam ?Triage Vital Signs ?ED Triage Vitals [06/22/21 1337]  ?Enc Vitals Group  ?   BP 110/61  ?   Pulse Rate 98  ?   Resp 18  ?   Temp 98.7 ?F (37.1 ?C)  ?   Temp Source Oral  ?   SpO2 96 %  ?   Weight 139 lb (63 kg)  ?   Height '5\' 10"'$  (1.778 m)  ?   Head Circumference   ?   Peak Flow   ?   Pain Score 0  ?   Pain Loc   ?   Pain Edu?   ?   Excl. in [place]?   ? ?No data found. ? ?Updated Vital Signs ?BP 110/61 (BP Location: Right Arm)   Pulse 98   Temp 98.7 ?F (37.1 ?C) (Oral)   Resp 18   Ht '5\' 10"'$  (1.778 m)   Wt 139 lb (63 kg)   SpO2 96%   BMI 19.94 kg/m?  ? ?Visual Acuity ?Right Eye Distance:   ?Left Eye Distance:   ?Bilateral Distance:   ? ?Right Eye Near:   ?Left Eye Near:    ?Bilateral Near:    ? ?Physical Exam ?Vitals and nursing note reviewed.  ?Constitutional:   ?   Appearance: Normal appearance.  ?HENT:  ?   Head: Atraumatic.  ?   Mouth/Throat:  ?   Mouth: Mucous membranes are moist.  ?Eyes:  ?   Extraocular Movements: Extraocular movements intact.  ?   Conjunctiva/sclera: Conjunctivae normal.  ?Cardiovascular:  ?   Rate and Rhythm: Normal rate and regular rhythm.  ?   Heart sounds: Normal heart sounds.  ?Pulmonary:  ?   Effort: Pulmonary effort is normal.  ?   Breath sounds:  Normal breath sounds.  ?Abdominal:  ?   General: Bowel sounds are normal. There is no distension.  ?   Palpations: Abdomen is soft.  ?   Tenderness: There is no abdominal tenderness. There is no guarding.  ?Musculoskeletal

## 2021-07-01 ENCOUNTER — Encounter: Payer: Self-pay | Admitting: Family Medicine

## 2021-07-01 ENCOUNTER — Ambulatory Visit (HOSPITAL_COMMUNITY)
Admission: RE | Admit: 2021-07-01 | Discharge: 2021-07-01 | Disposition: A | Discharge status: Home / Self Care | Payer: Medicare PPO | Source: Ambulatory Visit | Attending: Family Medicine | Admitting: Family Medicine

## 2021-07-01 ENCOUNTER — Ambulatory Visit (INDEPENDENT_AMBULATORY_CARE_PROVIDER_SITE_OTHER): Payer: Medicare PPO | Admitting: Family Medicine

## 2021-07-01 VITALS — BP 98/57 | HR 91 | Temp 97.5°F | Ht 68.0 in | Wt 146.8 lb

## 2021-07-01 DIAGNOSIS — R634 Abnormal weight loss: Secondary | ICD-10-CM | POA: Insufficient documentation

## 2021-07-01 DIAGNOSIS — E1159 Type 2 diabetes mellitus with other circulatory complications: Secondary | ICD-10-CM

## 2021-07-01 DIAGNOSIS — Z8679 Personal history of other diseases of the circulatory system: Secondary | ICD-10-CM | POA: Insufficient documentation

## 2021-07-01 DIAGNOSIS — G8929 Other chronic pain: Secondary | ICD-10-CM | POA: Insufficient documentation

## 2021-07-01 DIAGNOSIS — Z794 Long term (current) use of insulin: Secondary | ICD-10-CM | POA: Diagnosis not present

## 2021-07-01 DIAGNOSIS — E785 Hyperlipidemia, unspecified: Secondary | ICD-10-CM

## 2021-07-01 DIAGNOSIS — M545 Low back pain, unspecified: Secondary | ICD-10-CM | POA: Insufficient documentation

## 2021-07-01 DIAGNOSIS — R399 Unspecified symptoms and signs involving the genitourinary system: Secondary | ICD-10-CM

## 2021-07-01 DIAGNOSIS — I10 Essential (primary) hypertension: Secondary | ICD-10-CM

## 2021-07-01 DIAGNOSIS — R300 Dysuria: Secondary | ICD-10-CM

## 2021-07-01 DIAGNOSIS — M5136 Other intervertebral disc degeneration, lumbar region: Secondary | ICD-10-CM | POA: Diagnosis not present

## 2021-07-01 HISTORY — DX: Personal history of other diseases of the circulatory system: Z86.79

## 2021-07-01 LAB — POCT URINALYSIS DIPSTICK
Glucose, UA: POSITIVE — AB
Spec Grav, UA: 1.01 (ref 1.010–1.025)
pH, UA: 6 (ref 5.0–8.0)

## 2021-07-01 NOTE — Progress Notes (Signed)
? ?Subjective:  ?Patient ID: Jeremy Patton, male    DOB: 1940-09-26  Age: 81 y.o. MRN: [id number] ? ?CC: ?Chief Complaint  ?Patient presents with  ? Establish Care  ?  Went to Urgent Care on 06/22/21 and they sent him to [facility]; after fluids pt felt back. Lower back pain also; pt had kidney infection. Sometimes having painful urination.   ? ? ?HPI: ? ?81 year old male with carotid artery disease status post bilateral endarterectomy, hypertension, type 2 diabetes, GERD, history of congestive heart failure with no recent echo, hyperlipidemia presents to establish care with me. ? ?Recently seen at urgent care and subsequently in the ER for fatigue and generalized weakness.  Had a reassuring work-up in the ER although his BNP was slightly elevated.  He was treated with IV fluids and was discharged home.  Additionally, he was placed on Keflex for suspected UTI.  Culture grew staph epidermidis, 30,000 colony-forming units. ? ?Patient is accompanied by his wife today.  Patient reports chronic low back pain for years.  He states that he is unsure if anybody has taken a look at his back or did any kind of work-up. ? ?Patient reports some intermittent dysuria.  He reports a good stream and good flow.  No current urinary symptoms.  Previously seen by urology for BPH.  He is currently on Flomax. ? ?Had hospital admission for congestive heart failure in 2018.  Patient has not been seen by cardiology in quite some time.  No recent echo.  I am unsure why he remains on Actos despite his history of congestive heart failure.  Stopping today. ? ?Patient reports ongoing dizziness.  Patient's BP 98/57 today.  He is currently on Lasix, Benicar, and verapamil.  Is also on Jardiance which may be contributing to volume loss. ? ?Lastly, patient and his wife report that he has had ongoing weight loss.  Patient states that this has been over the past 6 to 8 months.  He states that he noticed increasing weight loss since  starting Jardiance.  Has good appetite.  Denies any hematochezia or melena.  Does have a family of colon cancer.  No recent PSA. ? ?Patient Active Problem List  ? Diagnosis Date Noted  ? History of CHF (congestive heart failure) 07/01/2021  ? Chronic low back pain without sciatica 07/01/2021  ? Unintentional weight loss 07/01/2021  ? Carotid artery disease ([place]) 05/01/2015  ? Essential hypertension 05/01/2015  ? Hyperlipidemia LDL goal <70 05/01/2015  ? Type 2 diabetes mellitus with circulatory disorder, with long-term current use of insulin ([place]) 05/01/2015  ? GERD (gastroesophageal reflux disease) 11/01/2014  ? FH: colon cancer in first degree relative <21 years old 11/01/2014  ? ? ?Social Hx   ?Social History  ? ?Socioeconomic History  ? Marital status: Married  ?  Spouse name: Not on file  ? Number of children: 4  ? Years of education: Not on file  ? Highest education level: Not on file  ?Occupational History  ? Occupation: retired  ?Tobacco Use  ? Smoking status: Former  ?  Packs/day: 2.00  ?  Years: 30.00  ?  Pack years: 60.00  ?  Types: Cigarettes  ?  Quit date: 12/17/1991  ?  Years since quitting: 29.5  ? Smokeless tobacco: Never  ?Vaping Use  ? Vaping Use: Never used  ?Substance and Sexual Activity  ? Alcohol use: No  ?  Alcohol/week: 0.0 standard drinks  ? Drug use: No  ? Sexual activity:  Not on file  ?Other Topics Concern  ? Not on file  ?Social History Narrative  ? Not on file  ? ?Social Determinants of Health  ? ?Financial Resource Strain: Not on file  ?Food Insecurity: Not on file  ?Transportation Needs: Not on file  ?Physical Activity: Not on file  ?Stress: Not on file  ?Social Connections: Not on file  ? ? ?Review of Systems ?Per HPI ? ?Objective:  ?BP (!) 98/57   Pulse 91   Temp (!) 97.5 ?F (36.4 ?C)   Ht '5\' 8"'$  (1.727 m)   Wt 146 lb 12.8 oz (66.6 kg)   SpO2 99%   BMI 22.32 kg/m?  ? ? ?  07/01/2021  ?  2:52 PM 06/22/2021  ? 11:00 PM 06/22/2021  ? 10:00 PM  ?BP/Weight  ?Systolic BP 98 696 295   ?Diastolic BP 57 58 65  ?Wt. (Lbs) 146.8    ?BMI 22.32 kg/m2    ? ? ?Physical Exam ?Vitals and nursing note reviewed.  ?Constitutional:   ?   Comments: Thin elderly male in no acute distress.  ?HENT:  ?   Head: Normocephalic and atraumatic.  ?   Mouth/Throat:  ?   Pharynx: Oropharynx is clear.  ?Eyes:  ?   General:     ?   Right eye: No discharge.     ?   Left eye: No discharge.  ?   Conjunctiva/sclera: Conjunctivae normal.  ?Cardiovascular:  ?   Rate and Rhythm: Normal rate and regular rhythm.  ?Pulmonary:  ?   Effort: Pulmonary effort is normal.  ?   Breath sounds: Normal breath sounds. No wheezing, rhonchi or rales.  ?Abdominal:  ?   General: There is no distension.  ?   Palpations: Abdomen is soft.  ?   Tenderness: There is no abdominal tenderness.  ?Neurological:  ?   Mental Status: He is alert.  ?Psychiatric:     ?   Mood and Affect: Mood normal.     ?   Behavior: Behavior normal.  ? ? ?Lab Results  ?Component Value Date  ? WBC 5.6 06/22/2021  ? HGB 13.0 06/22/2021  ? HCT 41.4 06/22/2021  ? PLT 145 (L) 06/22/2021  ? GLUCOSE 117 (H) 06/22/2021  ? CHOL 163 04/30/2015  ? TRIG 55 04/30/2015  ? HDL 63 04/30/2015  ? [place] 89 04/30/2015  ? ALT 16 06/22/2021  ? AST 18 06/22/2021  ? NA 142 06/22/2021  ? K 4.3 06/22/2021  ? CL 109 06/22/2021  ? CREATININE 1.14 06/22/2021  ? BUN 27 (H) 06/22/2021  ? CO2 23 06/22/2021  ? TSH 1.467 06/22/2021  ? INR 0.98 06/08/2016  ? HGBA1C 9.1 (H) 05/05/2015  ? ? ? ?Assessment & Plan:  ? ?Problem List Items Addressed This Visit   ? ?  ? Cardiovascular and Mediastinum  ? Essential hypertension - Primary  ?  BP low here today.  Dizziness is secondary to his blood pressure.  Stopping verapamil.  Patient can continue Lasix and Benicar.  Awaiting labs. ? ?  ?  ? Type 2 diabetes mellitus with circulatory disorder, with long-term current use of insulin ([place])  ?  Unsure of current control.  He is on Jardiance, metformin, Lantus.  I am stopping his Actos due to history of congestive heart  failure.  A1c today. ? ?  ?  ? Relevant Orders  ? Hemoglobin A1c  ? Microalbumin / creatinine urine ratio  ?  ? Other  ? Chronic low  back pain without sciatica  ? Relevant Orders  ? DG Lumbar Spine Complete  ? History of CHF (congestive heart failure)  ?  Euvolemic today.  Continue Lasix.  Will need echo in the future. ? ?  ?  ? Hyperlipidemia LDL goal <70  ?  Unsure of status.  Awaiting labs.  He is currently on Crestor. ? ?  ?  ? Relevant Orders  ? Lipid panel  ? Unintentional weight loss  ?  Awaiting labs.  Unclear etiology at this time. ? ?  ?  ? Relevant Orders  ? PSA  ? ?Other Visit Diagnoses   ? ? Lower urinary tract symptoms (LUTS)      ? Dysuria      ? Relevant Orders  ? POCT Urinalysis Dipstick (Completed)  ? ?  ? ?Follow-up:  Return in about 2 weeks (around 07/15/2021). ? ?Thersa Salt DO ?[place] ? ?

## 2021-07-01 NOTE — Assessment & Plan Note (Signed)
Awaiting labs.  Unclear etiology at this time. ?

## 2021-07-01 NOTE — Assessment & Plan Note (Addendum)
Unsure of current control.  He is on Jardiance, metformin, Lantus.  I am stopping his Actos due to history of congestive heart failure.  A1c today. ?

## 2021-07-01 NOTE — Assessment & Plan Note (Addendum)
BP low here today.  Dizziness is secondary to his blood pressure.  Stopping verapamil.  Patient can continue Lasix and Benicar.  Awaiting labs. ?

## 2021-07-01 NOTE — Assessment & Plan Note (Signed)
Euvolemic today.  Continue Lasix.  Will need echo in the future. ?

## 2021-07-01 NOTE — Assessment & Plan Note (Signed)
Unsure of status.  Awaiting labs.  He is currently on Crestor. ?

## 2021-07-01 NOTE — Patient Instructions (Addendum)
Labs and xray today. ? ?Stop Verapamil. ? ?Stop the Actos. ? ?Follow up in 2 weeks. ?

## 2021-07-02 ENCOUNTER — Other Ambulatory Visit: Payer: Self-pay | Admitting: *Deleted

## 2021-07-02 ENCOUNTER — Telehealth: Payer: Self-pay

## 2021-07-02 DIAGNOSIS — R972 Elevated prostate specific antigen [PSA]: Secondary | ICD-10-CM

## 2021-07-02 DIAGNOSIS — R7309 Other abnormal glucose: Secondary | ICD-10-CM

## 2021-07-02 LAB — PSA: Prostate Specific Ag, Serum: 4.9 ng/mL — ABNORMAL HIGH (ref 0.0–4.0)

## 2021-07-02 LAB — HEMOGLOBIN A1C
Est. average glucose Bld gHb Est-mCnc: 192 mg/dL
Hgb A1c MFr Bld: 8.3 % — ABNORMAL HIGH (ref 4.8–5.6)

## 2021-07-02 LAB — LIPID PANEL
Chol/HDL Ratio: 2.3 ratio (ref 0.0–5.0)
Cholesterol, Total: 132 mg/dL (ref 100–199)
HDL: 58 mg/dL (ref >39)
LDL Chol Calc (NIH): 54 mg/dL (ref 0–99)
Triglycerides: 114 mg/dL (ref 0–149)
VLDL Cholesterol Cal: 20 mg/dL (ref 5–40)

## 2021-07-02 NOTE — Telephone Encounter (Signed)
Post ED Visit - Positive Culture Follow-up: Successful Patient Follow-Up ? ?Culture assessed and recommendations reviewed by: ? ?'[x]'$  Bertis Ruddy, Pharm.D. ?'[]'$  Heide Guile, Pharm.D., BCPS AQ-ID ?'[]'$  Parks Neptune, Pharm.D., BCPS ?'[]'$  Alycia Rossetti, Pharm.D., BCPS ?'[]'$  [place], Pharm.D., BCPS, AAHIVP ?'[]'$  Legrand Como, Pharm.D., BCPS, AAHIVP ?'[]'$  Salome Arnt, PharmD, BCPS ?'[]'$  Johnnette Gourd, PharmD, BCPS ?'[]'$  Hughes Better, PharmD, BCPS ?'[]'$  Leeroy Cha, PharmD ? ?Positive urine culture ? ?'[]'$  Patient discharged without antimicrobial prescription and treatment is now indicated ?'[x]'$  Organism is resistant to prescribed ED discharge antimicrobial ?'[]'$  Patient with positive blood cultures ? ?Changes discussed with ED provider: Ranell Patrick, PA-C ? ?Plan: Contact pt for sx check if positive urinary sx d/c Keflex and give doxycycline '100mg'$  po BID x 7 days ? ?Spoke with pt, he denies any urinary sx. No new abx called in.  ? ?Contacted patient, date 07/02/2021, time 5:10pm ? ? ?Jeremy Patton ?07/02/2021, 5:13 PM ? ?  ?

## 2021-07-08 ENCOUNTER — Telehealth: Payer: Self-pay | Admitting: *Deleted

## 2021-07-14 ENCOUNTER — Ambulatory Visit: Payer: Medicare HMO | Admitting: Family Medicine

## 2021-07-16 ENCOUNTER — Ambulatory Visit (INDEPENDENT_AMBULATORY_CARE_PROVIDER_SITE_OTHER): Payer: Medicare PPO | Admitting: Family Medicine

## 2021-07-16 DIAGNOSIS — I10 Essential (primary) hypertension: Secondary | ICD-10-CM

## 2021-07-16 DIAGNOSIS — R972 Elevated prostate specific antigen [PSA]: Secondary | ICD-10-CM | POA: Insufficient documentation

## 2021-07-16 DIAGNOSIS — Z794 Long term (current) use of insulin: Secondary | ICD-10-CM | POA: Diagnosis not present

## 2021-07-16 DIAGNOSIS — E785 Hyperlipidemia, unspecified: Secondary | ICD-10-CM | POA: Diagnosis not present

## 2021-07-16 DIAGNOSIS — E1159 Type 2 diabetes mellitus with other circulatory complications: Secondary | ICD-10-CM

## 2021-07-16 DIAGNOSIS — R634 Abnormal weight loss: Secondary | ICD-10-CM | POA: Diagnosis not present

## 2021-07-16 MED ORDER — BLOOD GLUCOSE MONITOR KIT: PACK | 0 refills | Status: DC

## 2021-07-16 MED ORDER — TRIAMCINOLONE ACETONIDE 0.1 % EX CREA: 1.0000 "application " | TOPICAL_CREAM | Freq: Two times a day (BID) | CUTANEOUS | 0 refills | Status: DC

## 2021-07-16 NOTE — Assessment & Plan Note (Signed)
At goal. Continue statin.

## 2021-07-16 NOTE — Assessment & Plan Note (Signed)
Patient with reported recent weight loss.  Weight is up today and patient states that he has had improvement in appetite.  We will continue to follow closely.

## 2021-07-16 NOTE — Assessment & Plan Note (Signed)
Well-controlled.  Improved from prior.  Continue Benicar.  Lasix has been discontinued as well as verapamil.

## 2021-07-16 NOTE — Assessment & Plan Note (Signed)
Recommended goal of 8 given his age.  Continue metformin and Jardiance.  Continuing Lantus at current dosing.  Patient will check his blood sugar readings at least 3 times a week and bring them in so that his insulin regimen can be further adjusted.

## 2021-07-16 NOTE — Progress Notes (Signed)
Subjective:  Patient ID: Jeremy Patton, male    DOB: 01/22/1941  Age: 81 y.o. MRN: [id number]  CC: Chief Complaint  Patient presents with   Follow-up    Blood pressure low at last visit    HPI:  81 year old male with hypertension, type 2 diabetes, carotid artery disease status post endarterectomy, GERD, hyperlipidemia, history of CHF, chronic low back pain presents for follow-up.  At patient's last visit his blood pressure was low.  Verapamil was stopped.  Patient informs me today that he has not taken Lasix in many years.  This will be removed from his medication list.  He is on Benicar and doing well.  Blood pressure is improved and he is symptomatically better.  He was having frequent dizziness.  Patient's A1c returned at 8.3 after his last visit.  He is currently taking metformin 1000 mg twice daily and Lantus 16 units in the AM.  He is also taking Jardiance 10 mg daily.  He does not check his blood sugar regularly.  Hyperlipidemia is at goal.  Most recent labs revealed an LDL of 54.  Compliant with Crestor.  Tolerating well.  Previously endorsed unintentional weight loss.  His weight is up from last visit.  He states that he is having good appetite.  His PSA was elevated.  He has an upcoming appointment with urology.  Lastly, patient endorses dryness of the skin of the feet and ankles.  Patient Active Problem List   Diagnosis Date Noted   Elevated PSA 07/16/2021   History of CHF (congestive heart failure) 07/01/2021   Chronic low back pain without sciatica 07/01/2021   Unintentional weight loss 07/01/2021   Carotid artery disease ([place]) 05/01/2015   Essential hypertension 05/01/2015   Hyperlipidemia LDL goal <70 05/01/2015   Type 2 diabetes mellitus with circulatory disorder, with long-term current use of insulin ([place]) 05/01/2015   GERD (gastroesophageal reflux disease) 11/01/2014   FH: colon cancer in first degree relative <79 years old 11/01/2014    Social Hx    Social History   Socioeconomic History   Marital status: Married    Spouse name: Not on file   Number of children: 4   Years of education: Not on file   Highest education level: Not on file  Occupational History   Occupation: retired  Tobacco Use   Smoking status: Former    Packs/day: 2.00    Years: 30.00    Pack years: 60.00    Types: Cigarettes    Quit date: 12/17/1991    Years since quitting: 29.6   Smokeless tobacco: Never  Vaping Use   Vaping Use: Never used  Substance and Sexual Activity   Alcohol use: No    Alcohol/week: 0.0 standard drinks   Drug use: No   Sexual activity: Not on file  Other Topics Concern   Not on file  Social History Narrative   Not on file   Social Determinants of Health   Financial Resource Strain: Not on file  Food Insecurity: Not on file  Transportation Needs: Not on file  Physical Activity: Not on file  Stress: Not on file  Social Connections: Not on file    Review of Systems Per HPI  Objective:  BP 130/62   Pulse 71   Temp 97.6 F (36.4 C) (Oral)   Ht _0  (1.727 m)   Wt 149 lb 12.8 oz (67.9 kg)   PF 96 L/min   BMI 22.78 kg/m      07/16/2021  1:06 PM 07/01/2021    2:52 PM 06/22/2021   11:00 PM  BP/Weight  Systolic BP 078 98 675  Diastolic BP 62 57 58  Wt. (Lbs) 149.8 146.8   BMI 22.78 kg/m2 22.32 kg/m2     Physical Exam Constitutional:      General: He is not in acute distress.    Appearance: Normal appearance.  HENT:     Head: Normocephalic and atraumatic.  Eyes:     General:        Right eye: No discharge.        Left eye: No discharge.     Conjunctiva/sclera: Conjunctivae normal.  Cardiovascular:     Rate and Rhythm: Normal rate and regular rhythm.  Pulmonary:     Effort: Pulmonary effort is normal.     Breath sounds: Normal breath sounds. No wheezing, rhonchi or rales.  Abdominal:     General: There is no distension.     Palpations: Abdomen is soft.     Tenderness: There is no abdominal  tenderness.  Skin:    Comments: Dry skin which is peeling on the feet.  Neurological:     Mental Status: He is alert.  Psychiatric:        Mood and Affect: Mood normal.        Behavior: Behavior normal.    Lab Results  Component Value Date   WBC 5.6 06/22/2021   HGB 13.0 06/22/2021   HCT 41.4 06/22/2021   PLT 145 (L) 06/22/2021   GLUCOSE 117 (H) 06/22/2021   CHOL 132 07/01/2021   TRIG 114 07/01/2021   HDL 58 07/01/2021   LDLCALC 54 07/01/2021   ALT 16 06/22/2021   AST 18 06/22/2021   NA 142 06/22/2021   K 4.3 06/22/2021   CL 109 06/22/2021   CREATININE 1.14 06/22/2021   BUN 27 (H) 06/22/2021   CO2 23 06/22/2021   TSH 1.467 06/22/2021   INR 0.98 06/08/2016   HGBA1C 8.3 (H) 07/01/2021     Assessment & Plan:   Problem List Items Addressed This Visit       Cardiovascular and Mediastinum   Type 2 diabetes mellitus with circulatory disorder, with long-term current use of insulin ([place])    Recommended goal of 8 given his age.  Continue metformin and Jardiance.  Continuing Lantus at current dosing.  Patient will check his blood sugar readings at least 3 times a week and bring them in so that his insulin regimen can be further adjusted.       Essential hypertension    Well-controlled.  Improved from prior.  Continue Benicar.  Lasix has been discontinued as well as verapamil.         Other   Unintentional weight loss    Patient with reported recent weight loss.  Weight is up today and patient states that he has had improvement in appetite.  We will continue to follow closely.       Hyperlipidemia LDL goal <70    At goal.  Continue statin.       Elevated PSA    Patient has an upcoming appointment with urology.        Meds ordered this encounter  Medications   blood glucose meter kit and supplies KIT    Sig: Dispense based on patient and insurance preference. Use up to four times daily as directed.    Dispense:  1 each    Refill:  0    Order Specific  Question:  Number of strips    Answer:   100    Order Specific Question:   Number of lancets    Answer:   100   triamcinolone cream (KENALOG) 0.1 %    Sig: Apply 1 application. topically 2 (two) times daily. To the feet.    Dispense:  80 g    Refill:  0    Follow-up:  Return in about 3 months (around 10/16/2021).  [place]

## 2021-07-16 NOTE — Assessment & Plan Note (Signed)
Patient has an upcoming appointment with urology.

## 2021-07-16 NOTE — Patient Instructions (Addendum)
Check fasting blood sugar at least 3 times a week. Bring by the readings so I can adjust your insulin.   Continue your current medications.  Follow up in 3 months.  Take care  Dr. Lacinda Axon

## 2021-07-23 ENCOUNTER — Ambulatory Visit: Payer: Medicare PPO | Admitting: Urology

## 2021-07-23 VITALS — BP 124/61 | HR 102

## 2021-07-23 DIAGNOSIS — R300 Dysuria: Secondary | ICD-10-CM | POA: Diagnosis not present

## 2021-07-23 DIAGNOSIS — N401 Enlarged prostate with lower urinary tract symptoms: Secondary | ICD-10-CM

## 2021-07-23 DIAGNOSIS — N138 Other obstructive and reflux uropathy: Secondary | ICD-10-CM

## 2021-07-23 DIAGNOSIS — R972 Elevated prostate specific antigen [PSA]: Secondary | ICD-10-CM | POA: Diagnosis not present

## 2021-07-23 DIAGNOSIS — R3912 Poor urinary stream: Secondary | ICD-10-CM

## 2021-07-23 LAB — URINALYSIS, ROUTINE W REFLEX MICROSCOPIC
Bilirubin, UA: NEGATIVE
Ketones, UA: NEGATIVE
Leukocytes,UA: NEGATIVE
Nitrite, UA: NEGATIVE
Protein,UA: NEGATIVE
RBC, UA: NEGATIVE
Specific Gravity, UA: 1.01 (ref 1.005–1.030)
Urobilinogen, Ur: 0.2 mg/dL (ref 0.2–1.0)
pH, UA: 6 (ref 5.0–7.5)

## 2021-07-23 MED ORDER — TAMSULOSIN HCL 0.4 MG PO CAPS: 0.4000 mg | ORAL_CAPSULE | Freq: Every day | ORAL | 3 refills | Status: DC

## 2021-07-23 NOTE — Progress Notes (Unsigned)
Subjective: 1. Elevated PSA   2. BPH with urinary obstruction   3. Weak urinary stream   4. Dysuria     07/23/21: Jeremy Patton is sent back for a PSA of 4.9.  He last had an exam in 11/21.   The only other PSA is see was 16.5 in 2017 when he had a UTI.   He was in the ER on 06/22/21 with fatigue and the UA was suggestive of a UTI.  He had 30K staph epi on the culture.   He was treated with keflex.  He still has some dysuria and and pain in the penis.  The pain can radiate around into his back and legs but eases up with voiding.   He remains on tamsulosin.  His UA is clear today.  His IPSS is 4. He has no nocturia.  He had cystoscopy at his visit in 1/22 and had BPH with BOO.    03/06/20:  Jeremy Patton returns today in f/u for voiding studies.  He thinks the tamsulosin that we resumed helps some.  He had a PF of 63ml/sec today with an interrupted curve and his PVR is 129ml.  He continues to have back pain with right sciatica.  UA is clear.   GU Hx: Jeremy Patton is a former patient of Dr. Gaynelle Arabian with a history of BPH with LUT and UTI's. He is sent in consultation by Dr. Willey Blade with the complaint of burning with urination with a negative UA on 12/28/19.   He has a 1-2 week history of pain with voiding.   He has to strain to void and that causes pain in his upper back and legs.  The pain resolved when he finishes voiding and he can have some pain with an awkward step.  He has no dysuria.  He has frequency 3-6x daily.  He has small voids with intermittency and he doesn't feel empty.  He has nocturia x 1.  He has no issues with urgency.   He has had no hematuria.  He has had no recent UTI's. He has had no GU surgery.   He was given Cipro by Dr. Willey Blade but his UCx was negative and he stopped the med.   He has not been on BPH meds.  He is a diabetic and has neuropathy in his feet.     IPSS     Row Name 07/23/21 1100         International Prostate Symptom Score   How often have you had the sensation of not  emptying your bladder? Not at All     How often have you had to urinate less than every two hours? Not at All     How often have you found you stopped and started again several times when you urinated? Less than 1 in 5 times     How often have you found it difficult to postpone urination? Not at All     How often have you had a weak urinary stream? Less than 1 in 5 times     How often have you had to strain to start urination? Less than half the time     How many times did you typically get up at night to urinate? None     Total IPSS Score 4       Quality of Life due to urinary symptoms   If you were to spend the rest of your life with your urinary condition just the way it  is now how would you feel about that? Mixed               ROS:  ROS  Allergies  Allergen Reactions   Protamine     hypotension    Past Medical History:  Diagnosis Date   Arthritis    Carotid artery disease ([place])    a. s/p R CEA in 04/2015 b. s/p L CEA in 06/2015   CHF (congestive heart failure) ([place])    Diabetes mellitus without complication ([place]) 33/03/9516   GERD (gastroesophageal reflux disease)    Heart murmur    High cholesterol    Hyperlipidemia 11/29/2011   Hypertension 11/29/2011   Low back pain 11/29/2011   Lumbar radiculopathy 11/29/2011   Left   Pain of left lower leg    Wears glasses     Past Surgical History:  Procedure Laterality Date   CATARACT EXTRACTION W/ INTRAOCULAR LENS  IMPLANT, BILATERAL     CIRCUMCISION     Age 8   COLONOSCOPY  08/22/2005 and  07/31/2009   minimal internal hemorrhoids, left-sided diverticulosis   COLONOSCOPY N/A 11/25/2014   Procedure: COLONOSCOPY;  Surgeon: Daneil Dolin, MD;  [facility];  Service: Endoscopy;  Laterality: N/A;  1030   ENDARTERECTOMY Right 05/06/2015   Procedure: Right Carotid ENDARTERECTOMY ;  Surgeon: Conrad [place], MD;  Location: [place];  Service: Vascular;  Laterality: Right;   ENDARTERECTOMY Left 06/25/2015   Procedure: LEFT  CAROTID ENDARTERECTOMY;  Surgeon: Conrad [place], MD;  Location: [facility];  Service: Vascular;  Laterality: Left;   ESOPHAGEAL DILATION N/A 11/25/2014   Procedure: ESOPHAGEAL DILATION;  Surgeon: Daneil Dolin, MD;  [facility];  Service: Endoscopy;  Laterality: N/A;   ESOPHAGOGASTRODUODENOSCOPY N/A 11/25/2014   Procedure: ESOPHAGOGASTRODUODENOSCOPY (EGD);  Surgeon: Daneil Dolin, MD;  [facility];  Service: Endoscopy;  Laterality: N/A;   EYE SURGERY     MULTIPLE TOOTH EXTRACTIONS     PATCH ANGIOPLASTY Right 05/06/2015   Procedure: PATCH ANGIOPLASTY;  Surgeon: Conrad [place], MD;  Location: [place];  Service: Vascular;  Laterality: Right;   PATCH ANGIOPLASTY Left 06/25/2015   Procedure: PATCH ANGIOPLASTY USING XENOSURE BIOLOGIC PATCH 1cm X 6cm ;  Surgeon: Conrad [place], MD;  Location: [facility];  Service: Vascular;  Laterality: Left;   TONSILLECTOMY      Social History   Socioeconomic History   Marital status: Married    Spouse name: Not on file   Number of children: 4   Years of education: Not on file   Highest education level: Not on file  Occupational History   Occupation: retired  Tobacco Use   Smoking status: Former    Packs/day: 2.00    Years: 30.00    Pack years: 60.00    Types: Cigarettes    Quit date: 12/17/1991    Years since quitting: 29.6   Smokeless tobacco: Never  Vaping Use   Vaping Use: Never used  Substance and Sexual Activity   Alcohol use: No    Alcohol/week: 0.0 standard drinks   Drug use: No   Sexual activity: Not on file  Other Topics Concern   Not on file  Social History Narrative   Not on file   Social Determinants of Health   Financial Resource Strain: Not on file  Food Insecurity: Not on file  Transportation Needs: Not on file  Physical Activity: Not on file  Stress: Not on file  Social Connections:  Not on file  Intimate Partner Violence: Not on file    Family History  Problem Relation Age of Onset   Stroke Mother    Heart  disease Mother        before age 73   Colon cancer Brother        Age 54    Anti-infectives: Anti-infectives (From admission, onward)    None       Current Outpatient Medications  Medication Sig Dispense Refill   acetaminophen (TYLENOL) 500 MG tablet Take 1,000 mg by mouth 2 (two) times daily.     ALPRAZolam (XANAX) 0.25 MG tablet Take 0.25 mg by mouth daily as needed.     aspirin 81 MG tablet Take 81 mg by mouth daily.     blood glucose meter kit and supplies KIT Dispense based on patient and insurance preference. Use up to four times daily as directed. 1 each 0   JARDIANCE 10 MG TABS tablet Take 10 mg by mouth daily.     LANTUS SOLOSTAR 100 UNIT/ML Solostar Pen Inject 36 Units into the skin every morning. (Patient taking differently: Inject 16 Units into the skin every morning.) 15 mL 11   metFORMIN (GLUCOPHAGE) 1000 MG tablet Take 1,000 mg by mouth 2 (two) times daily with a meal.     olmesartan (BENICAR) 20 MG tablet Take 1 tablet by mouth daily.     pantoprazole (PROTONIX) 40 MG tablet Take 40 mg by mouth daily.      potassium chloride SA (K-DUR,KLOR-CON) 20 MEQ tablet Take 1 tablet (20 mEq total) by mouth daily. 30 tablet 12   rosuvastatin (CRESTOR) 20 MG tablet Take 1 tablet (20 mg total) by mouth daily. Needs appointment for further refills. 2nd notice 30 tablet 0   sertraline (ZOLOFT) 25 MG tablet Take 25 mg by mouth daily.     triamcinolone cream (KENALOG) 0.1 % Apply 1 application. topically 2 (two) times daily. To the feet. 80 g 0   tamsulosin (FLOMAX) 0.4 MG CAPS capsule Take 1 capsule (0.4 mg total) by mouth daily. 90 capsule 3   No current facility-administered medications for this visit.     Objective: Vital signs in last 24 hours: BP 124/61   Pulse (!) 102   Intake/Output from previous day: No intake/output data recorded. Intake/Output this shift: $RemoveBef'@IOTHISSHIFT'QSYxBENMqo$ @   Physical Exam Vitals reviewed.  Constitutional:      Appearance: Normal appearance.   Abdominal:     General: Abdomen is flat.     Hernia: No hernia is present.  Genitourinary:    Comments: Nl circ phallus with adequate meatus. Scrotum, testes and epididymis normal. AP without lesions. NST without mass. Prostate 1.5+ soft, smooth and non-tender. SV non-palpable.  Neurological:     Mental Status: He is alert.    Lab Results:  Lab Results  Component Value Date   PSA1 4.9 (H) 07/01/2021     UA is clear.  Studies/Results: PVR is 43ml.    Assessment/Plan: BPH with BOO.  He is voiding well on tamsulosin with mild symptoms.   He will continue current therapy.  Dysuria.  He has occasional burning and penile pain but his UA is clear.  He has been well evaluated and I recommended he cut out diet sodas.  Elevated PSA.  His PSA is 4.9 which is acceptable for his age and lower than the only prior level I can find.  His exam is benign.  He doesn't need further PSA's.   Meds ordered this  encounter  Medications   tamsulosin (FLOMAX) 0.4 MG CAPS capsule    Sig: Take 1 capsule (0.4 mg total) by mouth daily.    Dispense:  90 capsule    Refill:  3     Orders Placed This Encounter  Procedures   Urinalysis, Routine w reflex microscopic   Bladder Scan (Post Void Residual) in office     Return in about 1 year (around 07/24/2022).    CC: Dr. Thersa Salt.      Irine Seal 07/24/2021 [phone]

## 2021-07-24 ENCOUNTER — Encounter: Payer: Self-pay | Admitting: Urology

## 2021-07-31 ENCOUNTER — Encounter: Payer: Self-pay | Admitting: Nurse Practitioner

## 2021-07-31 ENCOUNTER — Ambulatory Visit: Payer: Medicare PPO | Admitting: Nurse Practitioner

## 2021-07-31 VITALS — BP 136/77 | HR 79 | Ht 68.0 in | Wt 151.0 lb

## 2021-07-31 DIAGNOSIS — Z794 Long term (current) use of insulin: Secondary | ICD-10-CM

## 2021-07-31 DIAGNOSIS — I10 Essential (primary) hypertension: Secondary | ICD-10-CM | POA: Diagnosis not present

## 2021-07-31 DIAGNOSIS — E1165 Type 2 diabetes mellitus with hyperglycemia: Secondary | ICD-10-CM

## 2021-07-31 LAB — POCT UA - MICROALBUMIN
Albumin/Creatinine Ratio, Urine, POC: <30
Creatinine, POC: 200 mg/dL
Microalbumin Ur, POC: 30 mg/L

## 2021-07-31 MED ORDER — LANTUS SOLOSTAR 100 UNIT/ML ~~LOC~~ SOPN
14.0000 [IU] | PEN_INJECTOR | Freq: Every day | SUBCUTANEOUS | 11 refills | Status: DC
Start: 2021-07-31 — End: 2021-08-18

## 2021-07-31 NOTE — Progress Notes (Signed)
Endocrinology Consult Note       07/31/2021, 10:26 AM   Subjective:    Patient ID: Jeremy Patton, male    DOB: 10-03-1940.  Jeremy Patton is being seen in consultation for management of currently uncontrolled symptomatic diabetes requested by  Coral Spikes, DO.   Past Medical History:  Diagnosis Date   Arthritis    Carotid artery disease ([place])    a. s/p R CEA in 04/2015 b. s/p L CEA in 06/2015   CHF (congestive heart failure) ([place])    Diabetes mellitus without complication ([place]) 17/08/9388   GERD (gastroesophageal reflux disease)    Heart murmur    High cholesterol    Hyperlipidemia 11/29/2011   Hypertension 11/29/2011   Low back pain 11/29/2011   Lumbar radiculopathy 11/29/2011   Left   Pain of left lower leg    Wears glasses     Past Surgical History:  Procedure Laterality Date   CATARACT EXTRACTION W/ INTRAOCULAR LENS  IMPLANT, BILATERAL     CIRCUMCISION     Age 46   COLONOSCOPY  08/22/2005 and  07/31/2009   minimal internal hemorrhoids, left-sided diverticulosis   COLONOSCOPY N/A 11/25/2014   Procedure: COLONOSCOPY;  Surgeon: Daneil Dolin, MD;  [facility];  Service: Endoscopy;  Laterality: N/A;  1030   ENDARTERECTOMY Right 05/06/2015   Procedure: Right Carotid ENDARTERECTOMY ;  Surgeon: Conrad Lynnville, MD;  Location: [place];  Service: Vascular;  Laterality: Right;   ENDARTERECTOMY Left 06/25/2015   Procedure: LEFT CAROTID ENDARTERECTOMY;  Surgeon: Conrad Los Ybanez, MD;  Location: [place];  Service: Vascular;  Laterality: Left;   ESOPHAGEAL DILATION N/A 11/25/2014   Procedure: ESOPHAGEAL DILATION;  Surgeon: Daneil Dolin, MD;  [facility];  Service: Endoscopy;  Laterality: N/A;   ESOPHAGOGASTRODUODENOSCOPY N/A 11/25/2014   Procedure: ESOPHAGOGASTRODUODENOSCOPY (EGD);  Surgeon: Daneil Dolin, MD;  [facility];  Service: Endoscopy;  Laterality: N/A;   EYE SURGERY      MULTIPLE TOOTH EXTRACTIONS     PATCH ANGIOPLASTY Right 05/06/2015   Procedure: PATCH ANGIOPLASTY;  Surgeon: Conrad Fleming, MD;  Location: [place];  Service: Vascular;  Laterality: Right;   PATCH ANGIOPLASTY Left 06/25/2015   Procedure: PATCH ANGIOPLASTY USING XENOSURE BIOLOGIC PATCH 1cm X 6cm ;  Surgeon: Conrad , MD;  Location[facility];  Service: Vascular;  Laterality: Left;   TONSILLECTOMY      Social History   Socioeconomic History   Marital status: Married    Spouse name: Not on file   Number of children: 4   Years of education: Not on file   Highest education level: Not on file  Occupational History   Occupation: retired  Tobacco Use   Smoking status: Former    Packs/day: 2.00    Years: 30.00    Total pack years: 60.00    Types: Cigarettes    Quit date: 12/17/1991    Years since quitting: 29.6   Smokeless tobacco: Never  Vaping Use   Vaping Use: Never used  Substance and Sexual Activity   Alcohol use: No    Alcohol/week: 0.0 standard drinks of alcohol  Drug use: No   Sexual activity: Not on file  Other Topics Concern   Not on file  Social History Narrative   Not on file   Social Determinants of Health   Financial Resource Strain: Not on file  Food Insecurity: Not on file  Transportation Needs: Not on file  Physical Activity: Not on file  Stress: Not on file  Social Connections: Not on file    Family History  Problem Relation Age of Onset   Stroke Mother    Heart disease Mother        before age 65   Colon cancer Brother        Age 44    Outpatient Encounter Medications as of 07/31/2021  Medication Sig   acetaminophen (TYLENOL) 500 MG tablet Take 1,000 mg by mouth 2 (two) times daily.   ALPRAZolam (XANAX) 0.25 MG tablet Take 0.25 mg by mouth daily as needed.   aspirin 81 MG tablet Take 81 mg by mouth daily.   blood glucose meter kit and supplies KIT Dispense based on patient and insurance preference. Use up to four times daily as directed.   JARDIANCE 10  MG TABS tablet Take 10 mg by mouth daily.   LANTUS SOLOSTAR 100 UNIT/ML Solostar Pen Inject 14 Units into the skin at bedtime.   metFORMIN (GLUCOPHAGE) 1000 MG tablet Take 1,000 mg by mouth 2 (two) times daily with a meal.   olmesartan (BENICAR) 20 MG tablet Take 1 tablet by mouth daily.   pantoprazole (PROTONIX) 40 MG tablet Take 40 mg by mouth daily.    potassium chloride SA (K-DUR,KLOR-CON) 20 MEQ tablet Take 1 tablet (20 mEq total) by mouth daily.   rosuvastatin (CRESTOR) 20 MG tablet Take 1 tablet (20 mg total) by mouth daily. Needs appointment for further refills. 2nd notice   sertraline (ZOLOFT) 25 MG tablet Take 25 mg by mouth daily.   tamsulosin (FLOMAX) 0.4 MG CAPS capsule Take 1 capsule (0.4 mg total) by mouth daily.   triamcinolone cream (KENALOG) 0.1 % Apply 1 application. topically 2 (two) times daily. To the feet.   [DISCONTINUED] LANTUS SOLOSTAR 100 UNIT/ML Solostar Pen Inject 36 Units into the skin every morning. (Patient taking differently: Inject 20 Units into the skin every morning.)   No facility-administered encounter medications on file as of 07/31/2021.    ALLERGIES: Allergies  Allergen Reactions   Protamine     hypotension    VACCINATION STATUS: Immunization History  Administered Date(s) Administered   Tdap 01/15/2020    Diabetes He presents for his initial diabetic visit. He has type 2 diabetes mellitus. Onset time: Diagnosed at approx age of 73. There are no hypoglycemic associated symptoms. Associated symptoms include blurred vision. There are no hypoglycemic complications. Symptoms are stable. Diabetic complications include retinopathy. Risk factors for coronary artery disease include diabetes mellitus, male sex and sedentary lifestyle. Current diabetic treatment includes insulin injections and oral agent (dual therapy). He is compliant with treatment most of the time. His weight is stable. He is following a generally healthy diet. When asked about meal  planning, he reported none. He has not had a previous visit with a dietitian. He rarely participates in exercise. (He presents today for his consultation, accompanied by his daughter, with no meter or logs to review.  His most recent A1c on 5/10 was 8.3%.  He does not routinely monitor glucose at home.  He drinks mostly water and diet pepsi, eats 3 meals per day plus snacks.  He does  not engage in much routine exercise due to his age and deconditioning.  He is UTD on eye exam, has not seen podiatry in very long time.) An ACE inhibitor/angiotensin II receptor blocker is being taken. He does not see a podiatrist.Eye exam is current.     Review of systems  Constitutional: + Minimally fluctuating body weight, current Body mass index is 22.96 kg/m., no fatigue, no subjective hyperthermia, no subjective hypothermia Eyes: + blurry vision, no xerophthalmia ENT: no sore throat, no nodules palpated in throat, no dysphagia/odynophagia, no hoarseness Cardiovascular: no chest pain, no shortness of breath, no palpitations, no leg swelling Respiratory: no cough, no shortness of breath Gastrointestinal: no nausea/vomiting/diarrhea Musculoskeletal: no muscle/joint aches Skin: no rashes, no hyperemia Neurological: no tremors, no numbness, no tingling, no dizziness Psychiatric: no depression, no anxiety  Objective:     BP 136/77   Pulse 79   Ht _0  (1.727 m)   Wt 151 lb (68.5 kg)   BMI 22.96 kg/m   Wt Readings from Last 3 Encounters:  07/31/21 151 lb (68.5 kg)  07/16/21 149 lb 12.8 oz (67.9 kg)  07/01/21 146 lb 12.8 oz (66.6 kg)     BP Readings from Last 3 Encounters:  07/31/21 136/77  07/23/21 124/61  07/16/21 130/62     Physical Exam- Limited  Constitutional:  Body mass index is 22.96 kg/m. , not in acute distress, normal state of mind Eyes:  EOMI, no exophthalmos Neck: Supple Cardiovascular: RRR, no murmurs, rubs, or gallops, no edema Respiratory: Adequate breathing efforts, no  crackles, rales, rhonchi, or wheezing Musculoskeletal: no gross deformities, strength intact in all four extremities, no gross restriction of joint movements Skin:  no rashes, no hyperemia Neurological: no tremor with outstretched hands    CMP ( most recent) CMP     Component Value Date/Time   NA 142 06/22/2021 1534   NA 139 04/30/2015 0813   K 4.3 06/22/2021 1534   CL 109 06/22/2021 1534   CO2 23 06/22/2021 1534   GLUCOSE 117 (H) 06/22/2021 1534   BUN 27 (H) 06/22/2021 1534   BUN 26 04/30/2015 0813   CREATININE 1.14 06/22/2021 1534   CREATININE 1.31 (H) 04/25/2015 1245   CALCIUM 9.5 06/22/2021 1534   PROT 7.3 06/22/2021 1534   PROT 6.9 04/30/2015 0813   ALBUMIN 3.8 06/22/2021 1534   ALBUMIN 4.2 04/30/2015 0813   AST 18 06/22/2021 1534   ALT 16 06/22/2021 1534   ALKPHOS 51 06/22/2021 1534   BILITOT 0.5 06/22/2021 1534   BILITOT 0.3 04/30/2015 0813   GFRNONAA >60 06/22/2021 1534   GFRAA 47 (L) 06/14/2016 0505     Diabetic Labs (most recent): Lab Results  Component Value Date   HGBA1C 8.3 (H) 07/01/2021   HGBA1C 9.1 (H) 05/05/2015   MICROALBUR 30 07/31/2021     Lipid Panel ( most recent) Lipid Panel     Component Value Date/Time   CHOL 132 07/01/2021 1548   TRIG 114 07/01/2021 1548   HDL 58 07/01/2021 1548   CHOLHDL 2.3 07/01/2021 1548   LDLCALC 54 07/01/2021 1548   LABVLDL 20 07/01/2021 1548      Lab Results  Component Value Date   TSH 1.467 06/22/2021   TSH 0.857 06/08/2016   TSH 1.250 04/30/2015           Assessment & Plan:   1) Type 2 diabetes mellitus with hyperglycemia, with long-term current use of insulin ([place])  He presents today for his consultation, accompanied by his  daughter, with no meter or logs to review.  His most recent A1c on 5/10 was 8.3%.  He does not routinely monitor glucose at home.  He drinks mostly water and diet pepsi, eats 3 meals per day plus snacks.  He does not engage in much routine exercise due to his age and  deconditioning.  He is UTD on eye exam, has not seen podiatry in very long time.  - DELLAS GUARD has currently uncontrolled symptomatic type 2 DM since 81 years of age, with most recent A1c of 8.3 %.   -Recent labs reviewed.  - I had a long discussion with him about the progressive nature of diabetes and the pathology behind its complications. -his diabetes is complicated by CHF and he remains at a high risk for more acute and chronic complications which include CAD, CVA, CKD, retinopathy, and neuropathy. These are all discussed in detail with him.  The following Lifestyle Medicine recommendations according to [facility]) were discussed and offered to patient and he agrees to start the journey:  A. Whole Foods, Plant-based plate comprising of fruits and vegetables, plant-based proteins, whole-grain carbohydrates was discussed in detail with the patient.   A list for source of those nutrients were also provided to the patient.  Patient will use only water or unsweetened tea for hydration. B.  The need to stay away from risky substances including alcohol, smoking; obtaining 7 to 9 hours of restorative sleep, at least 150 minutes of moderate intensity exercise weekly, the importance of healthy social connections,  and stress reduction techniques were discussed. C.  A full color page of  Calorie density of various food groups per pound showing examples of each food groups was provided to the patient.  - I have counseled him on diet and weight management by adopting a carbohydrate restricted/protein rich diet. Patient is encouraged to switch to unprocessed or minimally processed complex starch and increased protein intake (animal or plant source), fruits, and vegetables. -  he is advised to stick to a routine mealtimes to eat 3 meals a day and avoid unnecessary snacks (to snack only to correct hypoglycemia).   - he acknowledges that there is a room for improvement  in his food and drink choices. - Suggestion is made for him to avoid simple carbohydrates from his diet including Cakes, Sweet Desserts, Ice Cream, Soda (diet and regular), Sweet Tea, Candies, Chips, Cookies, Store Bought Juices, Alcohol in Excess of 1-2 drinks a day, Artificial Sweeteners, Coffee Creamer, and "Sugar-free" Products. This will help patient to have more stable blood glucose profile and potentially avoid unintended weight gain.  - I have approached him with the following individualized plan to manage his diabetes and patient agrees:   -He is advised to lower his Lantus to 14 units SQ nightly (and change from daytime administration to night).   -he is encouraged to start monitoring glucose 4 times daily, before meals and before bed, to log their readings on the clinic sheets provided, and bring them to review at follow up appointment in 2 weeks.  I did give him sample Dexcom G7 with receiver to start using.  He could benefit due to advanced age and insulin therapy to prevent hypoglycemia.  - he is warned not to take insulin without proper monitoring per orders. - Adjustment parameters are given to him for hypo and hyperglycemia in writing. - he is encouraged to call clinic for blood glucose levels less than 70 or above 300  mg /dl. - he is advised to continue Metformin 1000 mg po twice daily with meals and Jardiance 10 mg po daily (does mention this medication is expensive), therapeutically suitable for patient .  -He is not ideal candidate for incretin therapy due to body habitus and BMI of 22.  - Specific targets for  A1c; LDL, HDL, and Triglycerides were discussed with the patient.  2) Blood Pressure /Hypertension:  his blood pressure is controlled to target.   he is advised to continue his current medications including Olmesartan 20 mg po daily.  3) Lipids/Hyperlipidemia:    Review of his recent lipid panel from 07/01/21 showed controlled LDL at 54 .  he is advised to continue  Crestor 20 mg daily at bedtime.  Side effects and precautions discussed with him.  4)  Weight/Diet:  his Body mass index is 22.96 kg/m.  -  he is NOT a candidate for weight loss.   Exercise, and detailed carbohydrates information provided  -  detailed on discharge instructions.  5) Chronic Care/Health Maintenance: -he is on ACEI/ARB and Statin medications and is encouraged to initiate and continue to follow up with Ophthalmology, Dentist, Podiatrist at least yearly or according to recommendations, and advised to stay away from smoking. I have recommended yearly flu vaccine and pneumonia vaccine at least every 5 years; moderate intensity exercise for up to 150 minutes weekly; and sleep for at least 7 hours a day.  - he is advised to maintain close follow up with Coral Spikes, DO for primary care needs, as well as his other providers for optimal and coordinated care.   - Time spent in this patient care: 60 min, of which > 50% was spent in counseling him about his diabetes and the rest reviewing his blood glucose logs, discussing his hypoglycemia and hyperglycemia episodes, reviewing his current and previous labs/studies (including abstraction from other facilities) and medications doses and developing a long term treatment plan based on the latest standards of care/guidelines; and documenting his care.    Please refer to Patient Instructions for Blood Glucose Monitoring and Insulin/Medications Dosing Guide" in media tab for additional information. Please also refer to "Patient Self Inventory" in the Media tab for reviewed elements of pertinent patient history.  Margarito Courser participated in the discussions, expressed understanding, and voiced agreement with the above plans.  All questions were answered to his satisfaction. he is encouraged to contact clinic should he have any questions or concerns prior to his return visit.     Follow up plan: - Return in about 2 weeks (around 08/14/2021)  for Bring meter and logs, Diabetes F/U.    Rayetta Pigg, [facility] [address] [place], [place] [address] Phone: [phone] Fax: [phone]  07/31/2021, 10:26 AM

## 2021-07-31 NOTE — Patient Instructions (Signed)

## 2021-08-03 ENCOUNTER — Telehealth: Payer: Self-pay | Admitting: Nurse Practitioner

## 2021-08-03 NOTE — Telephone Encounter (Signed)
Patient's daughter left a VM stating his sensor for his Dexcom G7 is not working correctly and will not stick. She wanted to know if we could give him one from the office. I tried to call and tell him that he can come by the office to get one. He did not answer when I called.

## 2021-08-18 ENCOUNTER — Encounter: Payer: Self-pay | Admitting: Nurse Practitioner

## 2021-08-18 ENCOUNTER — Ambulatory Visit (INDEPENDENT_AMBULATORY_CARE_PROVIDER_SITE_OTHER): Payer: Medicare PPO | Admitting: Nurse Practitioner

## 2021-08-18 ENCOUNTER — Telehealth: Payer: Self-pay | Admitting: Family Medicine

## 2021-08-18 VITALS — BP 145/83 | HR 90 | Ht 68.0 in | Wt 155.0 lb

## 2021-08-18 DIAGNOSIS — E1165 Type 2 diabetes mellitus with hyperglycemia: Secondary | ICD-10-CM | POA: Diagnosis not present

## 2021-08-18 DIAGNOSIS — I10 Essential (primary) hypertension: Secondary | ICD-10-CM

## 2021-08-18 DIAGNOSIS — Z794 Long term (current) use of insulin: Secondary | ICD-10-CM | POA: Diagnosis not present

## 2021-08-18 MED ORDER — LANTUS SOLOSTAR 100 UNIT/ML ~~LOC~~ SOPN: 10.0000 [IU] | PEN_INJECTOR | Freq: Every day | SUBCUTANEOUS | 0 refills | Status: DC

## 2021-08-19 DIAGNOSIS — E1165 Type 2 diabetes mellitus with hyperglycemia: Secondary | ICD-10-CM | POA: Diagnosis not present

## 2021-08-26 ENCOUNTER — Other Ambulatory Visit: Payer: Self-pay | Admitting: Urology

## 2021-08-26 ENCOUNTER — Other Ambulatory Visit: Payer: Self-pay | Admitting: Family Medicine

## 2021-08-26 ENCOUNTER — Other Ambulatory Visit: Payer: Self-pay | Admitting: *Deleted

## 2021-08-26 MED ORDER — OLMESARTAN MEDOXOMIL 20 MG PO TABS: 20.0000 mg | ORAL_TABLET | Freq: Every day | ORAL | 0 refills | Status: DC

## 2021-08-31 ENCOUNTER — Telehealth: Payer: Self-pay

## 2021-08-31 MED ORDER — SITAGLIPTIN PHOSPHATE 50 MG PO TABS: 50.0000 mg | ORAL_TABLET | Freq: Every day | ORAL | 3 refills | Status: DC

## 2021-08-31 NOTE — Telephone Encounter (Signed)
I sent in for Januvia 50 mg po daily.  Im hoping it is less expensive for him.  Tell him if it is similarly priced to the [place], then dont pick it up and Ill call that one in for him since we know it works.  Just trying to save some money.

## 2021-08-31 NOTE — Telephone Encounter (Signed)
He had mentioned that this medication was pretty expensive.  We could always try a different medication like Januvia or Tradjenta which have been around longer, thus are more affordable.  But, I will do whatever he wants me to.

## 2021-08-31 NOTE — Telephone Encounter (Signed)
Pt called stated at his last visit he had discussed stopping jardiance. States he did not take it for approximately a week and his glucose readings "went out of wack". States he had readings as high as 396. States he started back on jardiance he had left over at home and since then his glucose readings are better, in between 112 to no more than 200 in the evening. He had enough jardiance at home to make through today ad has requested a Rx refill be sent to [facility].

## 2021-08-31 NOTE — Telephone Encounter (Signed)
Spoke with pt, he stated he is willing to try either one.

## 2021-08-31 NOTE — Telephone Encounter (Signed)
Pt made aware, voiced understanding. 

## 2021-09-04 DIAGNOSIS — Z961 Presence of intraocular lens: Secondary | ICD-10-CM | POA: Diagnosis not present

## 2021-09-04 DIAGNOSIS — E1139 Type 2 diabetes mellitus with other diabetic ophthalmic complication: Secondary | ICD-10-CM | POA: Diagnosis not present

## 2021-09-04 DIAGNOSIS — H35033 Hypertensive retinopathy, bilateral: Secondary | ICD-10-CM | POA: Diagnosis not present

## 2021-09-18 DIAGNOSIS — E1165 Type 2 diabetes mellitus with hyperglycemia: Secondary | ICD-10-CM | POA: Diagnosis not present

## 2021-09-24 ENCOUNTER — Other Ambulatory Visit: Payer: Self-pay | Admitting: Family Medicine

## 2021-10-16 ENCOUNTER — Ambulatory Visit (INDEPENDENT_AMBULATORY_CARE_PROVIDER_SITE_OTHER): Payer: Medicare PPO | Admitting: Family Medicine

## 2021-10-16 DIAGNOSIS — E1159 Type 2 diabetes mellitus with other circulatory complications: Secondary | ICD-10-CM | POA: Diagnosis not present

## 2021-10-16 DIAGNOSIS — I10 Essential (primary) hypertension: Secondary | ICD-10-CM

## 2021-10-16 DIAGNOSIS — Z794 Long term (current) use of insulin: Secondary | ICD-10-CM | POA: Diagnosis not present

## 2021-10-16 DIAGNOSIS — E785 Hyperlipidemia, unspecified: Secondary | ICD-10-CM | POA: Diagnosis not present

## 2021-10-16 MED ORDER — AMLODIPINE BESYLATE 5 MG PO TABS: 5.0000 mg | ORAL_TABLET | Freq: Every day | ORAL | 3 refills | Status: DC

## 2021-10-16 NOTE — Patient Instructions (Signed)
I have added a medication for your BP.  I will talk with endo about the Jardiance.  Follow up in 1 month for your BP.

## 2021-10-19 ENCOUNTER — Ambulatory Visit: Payer: Self-pay | Admitting: Family Medicine

## 2021-10-19 DIAGNOSIS — E1165 Type 2 diabetes mellitus with hyperglycemia: Secondary | ICD-10-CM | POA: Diagnosis not present

## 2021-10-19 NOTE — Progress Notes (Signed)
Subjective:  Patient ID: Jeremy Patton, male    DOB: 1940/03/30  Age: 81 y.o. MRN: [id number]  CC: Chief Complaint  Patient presents with   Hypertension    Follow up- patient states his blood pressure has been running high since stopping the 2 verapamil a day- feels like he may need to restart    HPI:  81 year old male with HTN, DM-2, GERD, History of CHF, HLD presents for follow up.  Patient reports that his home BP readings have been elevated. BP elevated today and on repeat. I recently de-escalated therapy due to hypotension. He is currently on Olmesartan 20 mg daily.  Following with endo. Most recent A1C 8.3. Patient recently taken off of Jardiance by Endo due to cost. He is currently on Januvia and Metformin. He reports that his blood sugars have been worse since stopping the Jardiance.   LDL at goal on Crestor.   Patient Active Problem List   Diagnosis Date Noted   Elevated PSA 07/16/2021   History of CHF (congestive heart failure) 07/01/2021   Chronic low back pain without sciatica 07/01/2021   Carotid artery disease ([place]) 05/01/2015   Essential hypertension 05/01/2015   Hyperlipidemia LDL goal <70 05/01/2015   Type 2 diabetes mellitus with circulatory disorder, with long-term current use of insulin ([place]) 05/01/2015   GERD (gastroesophageal reflux disease) 11/01/2014   FH: colon cancer in first degree relative <49 years old 11/01/2014    Social Hx   Social History   Socioeconomic History   Marital status: Married    Spouse name: Not on file   Number of children: 4   Years of education: Not on file   Highest education level: Not on file  Occupational History   Occupation: retired  Tobacco Use   Smoking status: Former    Packs/day: 2.00    Years: 30.00    Total pack years: 60.00    Types: Cigarettes    Quit date: 12/17/1991    Years since quitting: 29.8   Smokeless tobacco: Never  Vaping Use   Vaping Use: Never used  Substance and Sexual Activity    Alcohol use: No    Alcohol/week: 0.0 standard drinks of alcohol   Drug use: No   Sexual activity: Not on file  Other Topics Concern   Not on file  Social History Narrative   Not on file   Social Determinants of Health   Financial Resource Strain: Not on file  Food Insecurity: Not on file  Transportation Needs: Not on file  Physical Activity: Not on file  Stress: Not on file  Social Connections: Not on file    Review of Systems Per HPI  Objective:  BP (!) 164/92   Ht '5\' 8"'$  (1.727 m)   Wt 147 lb 3.2 oz (66.8 kg)   BMI 22.38 kg/m      10/16/2021    2:36 PM 10/16/2021    2:33 PM 08/18/2021    1:32 PM  BP/Weight  Systolic BP 300 762 263  Diastolic BP 92 82 83  Wt. (Lbs)  147.2 155  BMI  22.38 kg/m2 23.57 kg/m2    Physical Exam Vitals and nursing note reviewed.  Constitutional:      General: He is not in acute distress.    Appearance: Normal appearance.  HENT:     Head: Normocephalic and atraumatic.  Eyes:     General:        Right eye: No discharge.  Left eye: No discharge.     Conjunctiva/sclera: Conjunctivae normal.  Cardiovascular:     Rate and Rhythm: Normal rate and regular rhythm.  Pulmonary:     Effort: Pulmonary effort is normal.     Breath sounds: Normal breath sounds. No wheezing, rhonchi or rales.  Neurological:     Mental Status: He is alert.  Psychiatric:        Mood and Affect: Mood normal.        Behavior: Behavior normal.    Lab Results  Component Value Date   WBC 5.6 06/22/2021   HGB 13.0 06/22/2021   HCT 41.4 06/22/2021   PLT 145 (L) 06/22/2021   GLUCOSE 117 (H) 06/22/2021   CHOL 132 07/01/2021   TRIG 114 07/01/2021   HDL 58 07/01/2021   LDLCALC 54 07/01/2021   ALT 16 06/22/2021   AST 18 06/22/2021   NA 142 06/22/2021   K 4.3 06/22/2021   CL 109 06/22/2021   CREATININE 1.14 06/22/2021   BUN 27 (H) 06/22/2021   CO2 23 06/22/2021   TSH 1.467 06/22/2021   INR 0.98 06/08/2016   HGBA1C 8.3 (H) 07/01/2021   MICROALBUR 30  07/31/2021     Assessment & Plan:   Problem List Items Addressed This Visit       Cardiovascular and Mediastinum   Essential hypertension    BP elevated on repeat as well. Uncontrolled. Adding Norvasc. Continue Olmesartan.       Relevant Medications   amLODipine (NORVASC) 5 MG tablet   Type 2 diabetes mellitus with circulatory disorder, with long-term current use of insulin ([facility])    Uncontrolled. Messaged Endo to consider restarting Jardiance.       Relevant Medications   amLODipine (NORVASC) 5 MG tablet     Other   Hyperlipidemia LDL goal <70    At goal. Continue Crestor.       Relevant Medications   amLODipine (NORVASC) 5 MG tablet    Meds ordered this encounter  Medications   amLODipine (NORVASC) 5 MG tablet    Sig: Take 1 tablet (5 mg total) by mouth daily.    Dispense:  90 tablet    Refill:  3    Follow-up:  Return in about 1 month (around 11/16/2021) for HTN follow up.  [place]

## 2021-10-19 NOTE — Assessment & Plan Note (Signed)
At goal.  Continue Crestor. 

## 2021-10-19 NOTE — Assessment & Plan Note (Signed)
BP elevated on repeat as well. Uncontrolled. Adding Norvasc. Continue Olmesartan.

## 2021-10-19 NOTE — Assessment & Plan Note (Signed)
Uncontrolled. Messaged Endo to consider restarting Jardiance.

## 2021-10-22 ENCOUNTER — Other Ambulatory Visit: Payer: Self-pay | Admitting: Family Medicine

## 2021-11-18 ENCOUNTER — Ambulatory Visit: Payer: Medicare PPO | Admitting: Nurse Practitioner

## 2021-11-18 ENCOUNTER — Encounter: Payer: Self-pay | Admitting: Nurse Practitioner

## 2021-11-18 ENCOUNTER — Ambulatory Visit: Payer: Medicare PPO | Admitting: Family Medicine

## 2021-11-18 VITALS — BP 181/85 | HR 62 | Ht 68.0 in | Wt 148.8 lb

## 2021-11-18 DIAGNOSIS — Z794 Long term (current) use of insulin: Secondary | ICD-10-CM | POA: Diagnosis not present

## 2021-11-18 DIAGNOSIS — E1165 Type 2 diabetes mellitus with hyperglycemia: Secondary | ICD-10-CM

## 2021-11-18 LAB — POCT GLYCOSYLATED HEMOGLOBIN (HGB A1C): Hemoglobin A1C: 7.3 % — AB (ref 4.0–5.6)

## 2021-11-18 NOTE — Progress Notes (Signed)
Endocrinology Follow Up Note       11/18/2021, 3:48 PM   Subjective:    Patient ID: Jeremy Patton, male    DOB: Nov 18, 1940.  Jeremy Patton is being seen in follow up after being seen in consultation for management of currently uncontrolled symptomatic diabetes requested by  Coral Spikes, DO.   Past Medical History:  Diagnosis Date   Arthritis    Carotid artery disease ([place])    a. s/p R CEA in 04/2015 b. s/p L CEA in 06/2015   CHF (congestive heart failure) ([place])    Diabetes mellitus without complication ([place]) 66/06/9933   GERD (gastroesophageal reflux disease)    Heart murmur    High cholesterol    Hyperlipidemia 11/29/2011   Hypertension 11/29/2011   Low back pain 11/29/2011   Lumbar radiculopathy 11/29/2011   Left   Pain of left lower leg    Wears glasses     Past Surgical History:  Procedure Laterality Date   CATARACT EXTRACTION W/ INTRAOCULAR LENS  IMPLANT, BILATERAL     CIRCUMCISION     Age 81   COLONOSCOPY  08/22/2005 and  07/31/2009   minimal internal hemorrhoids, left-sided diverticulosis   COLONOSCOPY N/A 11/25/2014   Procedure: COLONOSCOPY;  Surgeon: Daneil Dolin, MD;  [facility];  Service: Endoscopy;  Laterality: N/A;  1030   ENDARTERECTOMY Right 05/06/2015   Procedure: Right Carotid ENDARTERECTOMY ;  Surgeon: Conrad Arvada, MD;  Location: [facility];  Service: Vascular;  Laterality: Right;   ENDARTERECTOMY Left 06/25/2015   Procedure: LEFT CAROTID ENDARTERECTOMY;  Surgeon: Conrad Vega Baja, MD;  Location: [facility];  Service: Vascular;  Laterality: Left;   ESOPHAGEAL DILATION N/A 11/25/2014   Procedure: ESOPHAGEAL DILATION;  Surgeon: Daneil Dolin, MD;  [facility];  Service: Endoscopy;  Laterality: N/A;   ESOPHAGOGASTRODUODENOSCOPY N/A 11/25/2014   Procedure: ESOPHAGOGASTRODUODENOSCOPY (EGD);  Surgeon: Daneil Dolin, MD;  [facility];  Service: Endoscopy;   Laterality: N/A;   EYE SURGERY     MULTIPLE TOOTH EXTRACTIONS     PATCH ANGIOPLASTY Right 05/06/2015   Procedure: PATCH ANGIOPLASTY;  Surgeon: Conrad Kingston, MD;  Location: [facility];  Service: Vascular;  Laterality: Right;   PATCH ANGIOPLASTY Left 06/25/2015   Procedure: PATCH ANGIOPLASTY USING XENOSURE BIOLOGIC PATCH 1cm X 6cm ;  Surgeon: Conrad Basile, MD;  Location: [facility] OR;  Service: Vascular;  Laterality: Left;   TONSILLECTOMY      Social History   Socioeconomic History   Marital status: Married    Spouse name: Not on file   Number of children: 4   Years of education: Not on file   Highest education level: Not on file  Occupational History   Occupation: retired  Tobacco Use   Smoking status: Former    Packs/day: 2.00    Years: 30.00    Total pack years: 60.00    Types: Cigarettes    Quit date: 12/17/1991    Years since quitting: 29.9   Smokeless tobacco: Never  Vaping Use   Vaping Use: Never used  Substance and Sexual Activity   Alcohol use: No    Alcohol/week:  0.0 standard drinks of alcohol   Drug use: No   Sexual activity: Not on file  Other Topics Concern   Not on file  Social History Narrative   Not on file   Social Determinants of Health   Financial Resource Strain: Not on file  Food Insecurity: Not on file  Transportation Needs: Not on file  Physical Activity: Not on file  Stress: Not on file  Social Connections: Not on file    Family History  Problem Relation Age of Onset   Stroke Mother    Heart disease Mother        before age 62   Colon cancer Brother        Age 6    Outpatient Encounter Medications as of 11/18/2021  Medication Sig   acetaminophen (TYLENOL) 500 MG tablet Take 1,000 mg by mouth 2 (two) times daily.   ALPRAZolam (XANAX) 0.25 MG tablet Take 0.25 mg by mouth daily as needed.   amLODipine (NORVASC) 5 MG tablet Take 1 tablet (5 mg total) by mouth daily.   aspirin 81 MG tablet Take 81 mg by mouth daily.   blood glucose meter kit and  supplies KIT Dispense based on patient and insurance preference. Use up to four times daily as directed.   LANTUS SOLOSTAR 100 UNIT/ML Solostar Pen Inject 10 Units into the skin at bedtime.   metFORMIN (GLUCOPHAGE) 1000 MG tablet Take 1,000 mg by mouth 2 (two) times daily with a meal.   olmesartan (BENICAR) 20 MG tablet Take 1 tablet (20 mg total) by mouth daily.   pantoprazole (PROTONIX) 40 MG tablet TAKE (1) TABLET BY MOUTH ONCE DAILY.   potassium chloride SA (KLOR-CON M) 20 MEQ tablet TAKE ONE TABLET BY MOUTH DAILY.   rosuvastatin (CRESTOR) 20 MG tablet Take 1 tablet (20 mg total) by mouth daily. Needs appointment for further refills. 2nd notice   sertraline (ZOLOFT) 25 MG tablet Take 25 mg by mouth daily.   sitaGLIPtin (JANUVIA) 50 MG tablet Take 1 tablet (50 mg total) by mouth daily.   tamsulosin (FLOMAX) 0.4 MG CAPS capsule Take 1 capsule (0.4 mg total) by mouth daily.   triamcinolone cream (KENALOG) 0.1 % Apply 1 application. topically 2 (two) times daily. To the feet.   No facility-administered encounter medications on file as of 11/18/2021.    ALLERGIES: Allergies  Allergen Reactions   Protamine     hypotension    VACCINATION STATUS: Immunization History  Administered Date(s) Administered   Tdap 01/15/2020    Diabetes He presents for his follow-up diabetic visit. He has type 2 diabetes mellitus. Onset time: Diagnosed at approx age of 39. His disease course has been improving. There are no hypoglycemic associated symptoms. Associated symptoms include blurred vision. There are no hypoglycemic complications. Symptoms are stable. Diabetic complications include retinopathy. Risk factors for coronary artery disease include diabetes mellitus, male sex and sedentary lifestyle. Current diabetic treatment includes insulin injections and oral agent (dual therapy). He is compliant with treatment most of the time. His weight is fluctuating minimally. He is following a generally healthy diet.  When asked about meal planning, he reported none. He has not had a previous visit with a dietitian. He rarely participates in exercise. His home blood glucose trend is decreasing steadily. His overall blood glucose range is 140-180 mg/dl. (He presents today with his CGM showing at target glycemic profile overall.  His POCT A1c today is 7.3%, improving from last visit of 8.3%.  He lowered his Lantus to  5 units nightly as he is stretching out his insulin due to cost.  Analysis of his CGM shows TIR 69%, TAR 30%, TBR <1% with a GMI of 7.2%.  He denies any significant hypoglycemia.) An ACE inhibitor/angiotensin II receptor blocker is being taken. He does not see a podiatrist.Eye exam is current.     Review of systems  Constitutional: + Minimally fluctuating body weight, current Body mass index is 22.62 kg/m., no fatigue, no subjective hyperthermia, no subjective hypothermia Eyes: + blurry vision, no xerophthalmia ENT: no sore throat, no nodules palpated in throat, no dysphagia/odynophagia, no hoarseness Cardiovascular: no chest pain, no shortness of breath, no palpitations, no leg swelling Respiratory: no cough, no shortness of breath Gastrointestinal: no nausea/vomiting/diarrhea Musculoskeletal: no muscle/joint aches Skin: no rashes, no hyperemia Neurological: no tremors, no numbness, no tingling, no dizziness Psychiatric: no depression, no anxiety  Objective:     BP (!) 181/85 (BP Location: Right Arm, Patient Position: Sitting, Cuff Size: Normal)   Pulse 62   Ht _0  (1.727 m)   Wt 148 lb 12.8 oz (67.5 kg)   BMI 22.62 kg/m   Wt Readings from Last 3 Encounters:  11/18/21 148 lb 12.8 oz (67.5 kg)  10/16/21 147 lb 3.2 oz (66.8 kg)  08/18/21 155 lb (70.3 kg)     BP Readings from Last 3 Encounters:  11/18/21 (!) 181/85  10/16/21 (!) 164/92  08/18/21 (!) 145/83      Physical Exam- Limited  Constitutional:  Body mass index is 22.62 kg/m. , not in acute distress, normal state of  mind Eyes:  EOMI, no exophthalmos Neck: Supple Cardiovascular: RRR, no murmurs, rubs, or gallops, no edema Respiratory: Adequate breathing efforts, no crackles, rales, rhonchi, or wheezing Musculoskeletal: no gross deformities, strength intact in all four extremities, no gross restriction of joint movements Skin:  no rashes, no hyperemia Neurological: no tremor with outstretched hands    CMP ( most recent) CMP     Component Value Date/Time   NA 142 06/22/2021 1534   NA 139 04/30/2015 0813   K 4.3 06/22/2021 1534   CL 109 06/22/2021 1534   CO2 23 06/22/2021 1534   GLUCOSE 117 (H) 06/22/2021 1534   BUN 27 (H) 06/22/2021 1534   BUN 26 04/30/2015 0813   CREATININE 1.14 06/22/2021 1534   CREATININE 1.31 (H) 04/25/2015 1245   CALCIUM 9.5 06/22/2021 1534   PROT 7.3 06/22/2021 1534   PROT 6.9 04/30/2015 0813   ALBUMIN 3.8 06/22/2021 1534   ALBUMIN 4.2 04/30/2015 0813   AST 18 06/22/2021 1534   ALT 16 06/22/2021 1534   ALKPHOS 51 06/22/2021 1534   BILITOT 0.5 06/22/2021 1534   BILITOT 0.3 04/30/2015 0813   GFRNONAA >60 06/22/2021 1534   GFRAA 47 (L) 06/14/2016 0505     Diabetic Labs (most recent): Lab Results  Component Value Date   HGBA1C 7.3 (A) 11/18/2021   HGBA1C 8.3 (H) 07/01/2021   HGBA1C 9.1 (H) 05/05/2015   MICROALBUR 30 07/31/2021     Lipid Panel ( most recent) Lipid Panel     Component Value Date/Time   CHOL 132 07/01/2021 1548   TRIG 114 07/01/2021 1548   HDL 58 07/01/2021 1548   CHOLHDL 2.3 07/01/2021 1548   LDLCALC 54 07/01/2021 1548   LABVLDL 20 07/01/2021 1548      Lab Results  Component Value Date   TSH 1.467 06/22/2021   TSH 0.857 06/08/2016   TSH 1.250 04/30/2015  Assessment & Plan:   1) Type 2 diabetes mellitus with hyperglycemia, with long-term current use of insulin ([place])  He presents today with his CGM showing at target glycemic profile overall.  His POCT A1c today is 7.3%, improving from last visit of 8.3%.  He  lowered his Lantus to 5 units nightly as he is stretching out his insulin due to cost.  Analysis of his CGM shows TIR 69%, TAR 30%, TBR <1% with a GMI of 7.2%.  He denies any significant hypoglycemia.  - Kyllian Clingerman Heinlen has currently uncontrolled symptomatic type 2 DM since 81 years of age.   -Recent labs reviewed.  - I had a long discussion with him about the progressive nature of diabetes and the pathology behind its complications. -his diabetes is complicated by CHF and he remains at a high risk for more acute and chronic complications which include CAD, CVA, CKD, retinopathy, and neuropathy. These are all discussed in detail with him.  The following Lifestyle Medicine recommendations according to [facility]) were discussed and offered to patient and he agrees to start the journey:  A. Whole Foods, Plant-based plate comprising of fruits and vegetables, plant-based proteins, whole-grain carbohydrates was discussed in detail with the patient.   A list for source of those nutrients were also provided to the patient.  Patient will use only water or unsweetened tea for hydration. B.  The need to stay away from risky substances including alcohol, smoking; obtaining 7 to 9 hours of restorative sleep, at least 150 minutes of moderate intensity exercise weekly, the importance of healthy social connections,  and stress reduction techniques were discussed. C.  A full color page of  Calorie density of various food groups per pound showing examples of each food groups was provided to the patient.  - Nutritional counseling repeated at each appointment due to patients tendency to fall back in to old habits.  - The patient admits there is a room for improvement in their diet and drink choices. -  Suggestion is made for the patient to avoid simple carbohydrates from their diet including Cakes, Sweet Desserts / Pastries, Ice Cream, Soda (diet and regular), Sweet Tea, Candies,  Chips, Cookies, Sweet Pastries, Store Bought Juices, Alcohol in Excess of 1-2 drinks a day, Artificial Sweeteners, Coffee Creamer, and "Sugar-free" Products. This will help patient to have stable blood glucose profile and potentially avoid unintended weight gain.   - I encouraged the patient to switch to unprocessed or minimally processed complex starch and increased protein intake (animal or plant source), fruits, and vegetables.   - Patient is advised to stick to a routine mealtimes to eat 3 meals a day and avoid unnecessary snacks (to snack only to correct hypoglycemia).  - I have approached him with the following individualized plan to manage his diabetes and patient agrees:   -He is advised to continue Lantus 5 units SQ nightly, Januvia 50 mg po daily, and Metformin 1000 mg po twice daily with meals.   -he is encouraged to continue monitoring glucose 4 times daily (using his CGM), before meals and before bed, and to call the clinic if he has readings less than 70 or above 300 for 3 tests in a row.  He is benefiting from CGM device, is advised to continue using it.  - he is warned not to take insulin without proper monitoring per orders. - Adjustment parameters are given to him for hypo and hyperglycemia in writing.  -He is not  ideal candidate for incretin therapy due to body habitus and BMI of 22.  - Specific targets for  A1c; LDL, HDL, and Triglycerides were discussed with the patient.  2) Blood Pressure /Hypertension:  his blood pressure is not controlled to target today.   he is advised to continue his current medications including Olmesartan 20 mg po daily.  3) Lipids/Hyperlipidemia:    Review of his recent lipid panel from 07/01/21 showed controlled LDL at 54 .  he is advised to continue Crestor 20 mg daily at bedtime.  Side effects and precautions discussed with him.  4)  Weight/Diet:  his Body mass index is 22.62 kg/m.  -  he is NOT a candidate for weight loss.   Exercise, and  detailed carbohydrates information provided  -  detailed on discharge instructions.  5) Chronic Care/Health Maintenance: -he is on ACEI/ARB and Statin medications and is encouraged to initiate and continue to follow up with Ophthalmology, Dentist, Podiatrist at least yearly or according to recommendations, and advised to stay away from smoking. I have recommended yearly flu vaccine and pneumonia vaccine at least every 5 years; moderate intensity exercise for up to 150 minutes weekly; and sleep for at least 7 hours a day.  - he is advised to maintain close follow up with Coral Spikes, DO for primary care needs, as well as his other providers for optimal and coordinated care.     I spent 30 minutes in the care of the patient today including review of labs from [place], Lipids, Thyroid Function, Hematology (current and previous including abstractions from other facilities); face-to-face time discussing  his blood glucose readings/logs, discussing hypoglycemia and hyperglycemia episodes and symptoms, medications doses, his options of short and long term treatment based on the latest standards of care / guidelines;  discussion about incorporating lifestyle medicine;  and documenting the encounter. Risk reduction counseling performed per USPSTF guidelines to reduce obesity and cardiovascular risk factors.     Please refer to Patient Instructions for Blood Glucose Monitoring and Insulin/Medications Dosing Guide"  in media tab for additional information. Please  also refer to " Patient Self Inventory" in the Media  tab for reviewed elements of pertinent patient history.  Margarito Courser participated in the discussions, expressed understanding, and voiced agreement with the above plans.  All questions were answered to his satisfaction. he is encouraged to contact clinic should he have any questions or concerns prior to his return visit.     Follow up plan: - Return in about 4 months (around 03/20/2022)  for Diabetes F/U with A1c in office, No previsit labs, Bring meter and logs.   Rayetta Pigg, [facility] [address] [place], [place] [address] Phone: [phone] Fax: [phone]  11/18/2021, 3:48 PM

## 2021-11-19 ENCOUNTER — Ambulatory Visit: Payer: Medicare PPO | Admitting: Nurse Practitioner

## 2021-11-19 ENCOUNTER — Ambulatory Visit: Payer: Medicare PPO | Admitting: Family Medicine

## 2021-11-19 DIAGNOSIS — E1165 Type 2 diabetes mellitus with hyperglycemia: Secondary | ICD-10-CM | POA: Diagnosis not present

## 2021-11-23 ENCOUNTER — Other Ambulatory Visit: Payer: Self-pay | Admitting: Family Medicine

## 2021-11-26 ENCOUNTER — Encounter: Payer: Self-pay | Admitting: Family Medicine

## 2021-11-26 ENCOUNTER — Ambulatory Visit (INDEPENDENT_AMBULATORY_CARE_PROVIDER_SITE_OTHER): Payer: Medicare PPO | Admitting: Family Medicine

## 2021-11-26 DIAGNOSIS — I10 Essential (primary) hypertension: Secondary | ICD-10-CM | POA: Diagnosis not present

## 2021-11-26 MED ORDER — AMLODIPINE BESYLATE 5 MG PO TABS: 5.0000 mg | ORAL_TABLET | Freq: Every day | ORAL | 3 refills | Status: DC

## 2021-11-26 NOTE — Patient Instructions (Signed)
Medications as prescribed.  Follow up in 3-6 months.  Take care  Dr. Lacinda Axon

## 2021-11-27 NOTE — Progress Notes (Signed)
Subjective:  Patient ID: Jeremy Patton, male    DOB: 30-Jun-1940  Age: 81 y.o. MRN: [id number]  CC: Chief Complaint  Patient presents with   Hypertension    Pt states blood pressure is still elevated. Check with wrist machine at home. Sometimes 200/90s. No symptoms of HTN.     HPI:  81 year old male presents for follow-up regarding hypertension.  Blood pressure control improved.  He is currently on olmesartan and amlodipine.  Tolerating well.  He states that he had some dizziness at home.  Does not check his blood pressure regularly.  Advised him that he needs to get a regular blood pressure cuff as opposed to a wrist cuff.  Patient Active Problem List   Diagnosis Date Noted   Elevated PSA 07/16/2021   History of CHF (congestive heart failure) 07/01/2021   Chronic low back pain without sciatica 07/01/2021   Carotid artery disease ([place]) 05/01/2015   Essential hypertension 05/01/2015   Hyperlipidemia LDL goal <70 05/01/2015   Type 2 diabetes mellitus with circulatory disorder, with long-term current use of insulin ([place]) 05/01/2015   GERD (gastroesophageal reflux disease) 11/01/2014   FH: colon cancer in first degree relative <107 years old 11/01/2014    Social Hx   Social History   Socioeconomic History   Marital status: Married    Spouse name: Not on file   Number of children: 4   Years of education: Not on file   Highest education level: Not on file  Occupational History   Occupation: retired  Tobacco Use   Smoking status: Former    Packs/day: 2.00    Years: 30.00    Total pack years: 60.00    Types: Cigarettes    Quit date: 12/17/1991    Years since quitting: 29.9   Smokeless tobacco: Never  Vaping Use   Vaping Use: Never used  Substance and Sexual Activity   Alcohol use: No    Alcohol/week: 0.0 standard drinks of alcohol   Drug use: No   Sexual activity: Not on file  Other Topics Concern   Not on file  Social History Narrative   Not on file    Social Determinants of Health   Financial Resource Strain: Not on file  Food Insecurity: Not on file  Transportation Needs: Not on file  Physical Activity: Not on file  Stress: Not on file  Social Connections: Not on file    Review of Systems Per HPI  Objective:  BP (!) 140/72   Pulse 92   Temp 97.8 F (36.6 C)   Wt 145 lb (65.8 kg)   SpO2 96%   BMI 22.05 kg/m      11/26/2021    2:06 PM 11/26/2021    1:56 PM 11/18/2021    3:19 PM  BP/Weight  Systolic BP 622 297 989  Diastolic BP 72 82 85  Wt. (Lbs)  145 148.8  BMI  22.05 kg/m2 22.62 kg/m2    Physical Exam Vitals and nursing note reviewed.  Constitutional:      General: He is not in acute distress.    Appearance: Normal appearance.  HENT:     Head: Normocephalic and atraumatic.  Cardiovascular:     Rate and Rhythm: Normal rate and regular rhythm.  Pulmonary:     Effort: Pulmonary effort is normal.     Breath sounds: Normal breath sounds. No wheezing, rhonchi or rales.  Neurological:     Mental Status: He is alert.  Psychiatric:  Mood and Affect: Mood normal.        Behavior: Behavior normal.     Lab Results  Component Value Date   WBC 5.6 06/22/2021   HGB 13.0 06/22/2021   HCT 41.4 06/22/2021   PLT 145 (L) 06/22/2021   GLUCOSE 117 (H) 06/22/2021   CHOL 132 07/01/2021   TRIG 114 07/01/2021   HDL 58 07/01/2021   LDLCALC 54 07/01/2021   ALT 16 06/22/2021   AST 18 06/22/2021   NA 142 06/22/2021   K 4.3 06/22/2021   CL 109 06/22/2021   CREATININE 1.14 06/22/2021   BUN 27 (H) 06/22/2021   CO2 23 06/22/2021   TSH 1.467 06/22/2021   INR 0.98 06/08/2016   HGBA1C 7.3 (A) 11/18/2021   MICROALBUR 30 07/31/2021     Assessment & Plan:   Problem List Items Addressed This Visit       Cardiovascular and Mediastinum   Essential hypertension    BP 140/72 today.  Given his advanced age, this is quite well controlled.  Continue current medications.      Relevant Medications   amLODipine  (NORVASC) 5 MG tablet    Meds ordered this encounter  Medications   amLODipine (NORVASC) 5 MG tablet    Sig: Take 1 tablet (5 mg total) by mouth daily.    Dispense:  90 tablet    Refill:  3    Follow-up:  Return in about 3 months (around 02/26/2022).  [place]

## 2021-11-27 NOTE — Assessment & Plan Note (Signed)
BP 140/72 today.  Given his advanced age, this is quite well controlled.  Continue current medications.

## 2021-12-10 ENCOUNTER — Ambulatory Visit (INDEPENDENT_AMBULATORY_CARE_PROVIDER_SITE_OTHER): Payer: Medicare PPO

## 2021-12-10 VITALS — Ht 68.0 in | Wt 145.0 lb

## 2021-12-10 DIAGNOSIS — Z0000 Encounter for general adult medical examination without abnormal findings: Secondary | ICD-10-CM

## 2021-12-10 NOTE — Progress Notes (Signed)
Virtual Visit via Telephone Note  I connected with  Margarito Courser on 12/10/21 at  2:15 PM EDT by telephone and verified that I am speaking with the correct person using two identifiers.  Location: Patient: home Provider: [facility] Persons participating in the virtual visit: patient/Nurse Health Advisor   I discussed the limitations, risks, security and privacy concerns of performing an evaluation and management service by telephone and the availability of in person appointments. The patient expressed understanding and agreed to proceed.  Interactive audio and video telecommunications were attempted between this nurse and patient, however failed, due to patient having technical difficulties OR patient did not have access to video capability.  We continued and completed visit with audio only.  Some vital signs may be absent or patient reported.   Dionisio David, LPN  Subjective:   GERRALD BASU is a 81 y.o. male who presents for Medicare Annual/Subsequent preventive examination.  Review of Systems     Cardiac Risk Factors include: advanced age (>70mn, >>29women);dyslipidemia;hypertension;male gender;diabetes mellitus     Objective:    There were no vitals filed for this visit. There is no height or weight on file to calculate BMI.     12/10/2021    2:24 PM 06/22/2021    3:02 PM 06/12/2016    2:00 PM 06/12/2016    6:30 AM 06/09/2016   12:00 AM 06/25/2015    3:00 PM 06/10/2015    9:59 AM  Advanced Directives  Does Patient Have a Medical Advance Directive? _0  No No  Would patient like information on creating a medical advance directive? No - Patient declined  No - Patient declined No - Patient declined No - Patient declined No - patient declined information No - patient declined information    Current Medications (verified) Outpatient Encounter Medications as of 12/10/2021  Medication Sig   ACCU-CHEK GUIDE test strip    Accu-Chek Softclix Lancets lancets     acetaminophen (TYLENOL) 500 MG tablet Take 1,000 mg by mouth 2 (two) times daily.   ALPRAZolam (XANAX) 0.25 MG tablet Take 0.25 mg by mouth daily as needed.   amLODipine (NORVASC) 5 MG tablet Take 1 tablet (5 mg total) by mouth daily.   aspirin 81 MG tablet Take 81 mg by mouth daily.   blood glucose meter kit and supplies KIT Dispense based on patient and insurance preference. Use up to four times daily as directed.   furosemide (LASIX) 40 MG tablet    LANTUS SOLOSTAR 100 UNIT/ML Solostar Pen Inject 10 Units into the skin at bedtime.   metFORMIN (GLUCOPHAGE) 1000 MG tablet Take 1,000 mg by mouth 2 (two) times daily with a meal.   olmesartan (BENICAR) 20 MG tablet TAKE (1) TABLET BY MOUTH ONCE DAILY.   pantoprazole (PROTONIX) 40 MG tablet TAKE (1) TABLET BY MOUTH ONCE DAILY.   potassium chloride SA (KLOR-CON M) 20 MEQ tablet TAKE ONE TABLET BY MOUTH DAILY.   rosuvastatin (CRESTOR) 20 MG tablet Take 1 tablet (20 mg total) by mouth daily. Needs appointment for further refills. 2nd notice   sertraline (ZOLOFT) 25 MG tablet TAKE ONE TABLET BY MOUTH ONCE DAILY.   sitaGLIPtin (JANUVIA) 50 MG tablet Take 1 tablet (50 mg total) by mouth daily.   tamsulosin (FLOMAX) 0.4 MG CAPS capsule Take 1 capsule (0.4 mg total) by mouth daily.   triamcinolone cream (KENALOG) 0.1 % Apply 1 application. topically 2 (two) times daily. To the feet.   JARDIANCE 10 MG TABS  tablet  (Patient not taking: Reported on 12/10/2021)   verapamil (CALAN-SR) 180 MG CR tablet  (Patient not taking: Reported on 12/10/2021)   No facility-administered encounter medications on file as of 12/10/2021.    Allergies (verified) Protamine   History: Past Medical History:  Diagnosis Date   Arthritis    Carotid artery disease ([place])    a. s/p R CEA in 04/2015 b. s/p L CEA in 06/2015   CHF (congestive heart failure) ([place])    Diabetes mellitus without complication ([place]) 89/03/1192   GERD (gastroesophageal reflux disease)    Heart murmur     High cholesterol    Hyperlipidemia 11/29/2011   Hypertension 11/29/2011   Low back pain 11/29/2011   Lumbar radiculopathy 11/29/2011   Left   Pain of left lower leg    Wears glasses    Past Surgical History:  Procedure Laterality Date   CATARACT EXTRACTION W/ INTRAOCULAR LENS  IMPLANT, BILATERAL     CIRCUMCISION     Age 82   COLONOSCOPY  08/22/2005 and  07/31/2009   minimal internal hemorrhoids, left-sided diverticulosis   COLONOSCOPY N/A 11/25/2014   Procedure: COLONOSCOPY;  Surgeon: Daneil Dolin, MD;  [facility];  Service: Endoscopy;  Laterality: N/A;  1030   ENDARTERECTOMY Right 05/06/2015   Procedure: Right Carotid ENDARTERECTOMY ;  Surgeon: Conrad [place], MD;  Location: [place];  Service: Vascular;  Laterality: Right;   ENDARTERECTOMY Left 06/25/2015   Procedure: LEFT CAROTID ENDARTERECTOMY;  Surgeon: Conrad [place], MD;  Location: [place];  Service: Vascular;  Laterality: Left;   ESOPHAGEAL DILATION N/A 11/25/2014   Procedure: ESOPHAGEAL DILATION;  Surgeon: Daneil Dolin, MD;  [facility];  Service: Endoscopy;  Laterality: N/A;   ESOPHAGOGASTRODUODENOSCOPY N/A 11/25/2014   Procedure: ESOPHAGOGASTRODUODENOSCOPY (EGD);  Surgeon: Daneil Dolin, MD;  [facility];  Service: Endoscopy;  Laterality: N/A;   EYE SURGERY     MULTIPLE TOOTH EXTRACTIONS     PATCH ANGIOPLASTY Right 05/06/2015   Procedure: PATCH ANGIOPLASTY;  Surgeon: Conrad [place], MD;  Location: [place];  Service: Vascular;  Laterality: Right;   PATCH ANGIOPLASTY Left 06/25/2015   Procedure: PATCH ANGIOPLASTY USING XENOSURE BIOLOGIC PATCH 1cm X 6cm ;  Surgeon: Conrad [place], MD;  Location: [place];  Service: Vascular;  Laterality: Left;   TONSILLECTOMY     Family History  Problem Relation Age of Onset   Stroke Mother    Heart disease Mother        before age 50   Colon cancer Brother        Age 54   Social History   Socioeconomic History   Marital status: Married    Spouse name: Not on file    Number of children: 4   Years of education: Not on file   Highest education level: Not on file  Occupational History   Occupation: retired  Tobacco Use   Smoking status: Former    Packs/day: 2.00    Years: 30.00    Total pack years: 60.00    Types: Cigarettes    Quit date: 12/17/1991    Years since quitting: 30.0   Smokeless tobacco: Never  Vaping Use   Vaping Use: Never used  Substance and Sexual Activity   Alcohol use: No    Alcohol/week: 0.0 standard drinks of alcohol   Drug use: No   Sexual activity: Not on file  Other Topics Concern   Not on file  Social  History Narrative   Not on file   Social Determinants of Health   Financial Resource Strain: Low Risk  (12/10/2021)   Overall Financial Resource Strain (CARDIA)    Difficulty of Paying Living Expenses: Not hard at all  Food Insecurity: No Food Insecurity (12/10/2021)   Hunger Vital Sign    Worried About Running Out of Food in the Last Year: Never true    Ran Out of Food in the Last Year: Never true  Transportation Needs: No Transportation Needs (12/10/2021)   PRAPARE - Hydrologist (Medical): No    Lack of Transportation (Non-Medical): No  Physical Activity: Insufficiently Active (12/10/2021)   Exercise Vital Sign    Days of Exercise per Week: 3 days    Minutes of Exercise per Session: 30 min  Stress: No Stress Concern Present (12/10/2021)   Martha    Feeling of Stress : Not at all  Social Connections: Moderately Isolated (12/10/2021)   Social Connection and Isolation Panel [NHANES]    Frequency of Communication with Friends and Family: More than three times a week    Frequency of Social Gatherings with Friends and Family: Once a week    Attends Religious Services: Never    Marine scientist or Organizations: No    Attends Music therapist: Never    Marital Status: Married    Tobacco  Counseling Counseling given: Not Answered   Clinical Intake:  Pre-visit preparation completed: Yes  Pain : No/denies pain     Nutritional Risks: None Diabetes: Yes CBG done?: No Did pt. bring in CBG monitor from home?: No  How often do you need to have someone help you when you read instructions, pamphlets, or other written materials from your doctor or pharmacy?: 1 - Never  Diabetic?yes Nutrition Risk Assessment:  Has the patient had any N/V/D within the last 2 months?  No  Does the patient have any non-healing wounds?  No  Has the patient had any unintentional weight loss or weight gain?  No   Diabetes:  Is the patient diabetic?  Yes  If diabetic, was a CBG obtained today?  No  Did the patient bring in their glucometer from home?  No  How often do you monitor your CBG's? continuous.   Financial Strains and Diabetes Management:  Are you having any financial strains with the device, your supplies or your medication? No .  Does the patient want to be seen by Chronic Care Management for management of their diabetes?  No  Would the patient like to be referred to a Nutritionist or for Diabetic Management?  No   Diabetic Exams:  Diabetic Eye Exam: Completed 08/02/19. Overdue for diabetic eye exam. Pt has been advised about the importance in completing this exam.   Diabetic Foot Exam: Completed 07/01/21. Pt has been advised about the importance in completing this exam.   Interpreter Needed?: No  Information entered by :: Kirke Shaggy, LPN   Activities of Daily Living    12/10/2021    2:25 PM  In your present state of health, do you have any difficulty performing the following activities:  Hearing? 0  Vision? 0  Difficulty concentrating or making decisions? 0  Walking or climbing stairs? 1  Dressing or bathing? 0  Doing errands, shopping? 0  Preparing Food and eating ? N  Using the Toilet? N  In the past six months, have you accidently leaked  urine? N  Do you  have problems with loss of bowel control? N  Managing your Medications? N  Managing your Finances? N  Housekeeping or managing your Housekeeping? N    Patient Care Team: Coral Spikes, DO as PCP - General (Family Medicine) Ahmed Prima Neta Ehlers as Physician Assistant (Physician Assistant) Gala Romney Cristopher Estimable, MD as Consulting Physician (Gastroenterology)  Indicate any recent Medical Services you may have received from other than [facility] providers in the past year (date may be approximate).     Assessment:   This is a routine wellness examination for [facility].  Hearing/Vision screen Hearing Screening - Comments:: No aids Vision Screening - Comments:: Wears glasses- Dr.Gouge  Dietary issues and exercise activities discussed: Current Exercise Habits: Home exercise routine, Type of exercise: walking, Time (Minutes): 30, Frequency (Times/Week): 3, Weekly Exercise (Minutes/Week): 90, Intensity: Mild   Goals Addressed             This Visit's Progress    DIET - EAT MORE FRUITS AND VEGETABLES         Depression Screen    12/10/2021    2:22 PM 10/16/2021    2:35 PM  PHQ 2/9 Scores  PHQ - 2 Score 0 0  PHQ- 9 Score 0     Fall Risk    12/10/2021    2:24 PM 07/01/2021    2:52 PM  [place] in the past year? 0 0  Number falls in past yr: 0 0  Injury with Fall? 0 0  Risk for fall due to : No Fall Risks No Fall Risks  Follow up Falls prevention discussed;Falls evaluation completed Falls evaluation completed    FALL RISK PREVENTION PERTAINING TO THE HOME:  Any stairs in or around the home? Yes  If so, are there any without handrails? No  Home free of loose throw rugs in walkways, pet beds, electrical cords, etc? Yes  Adequate lighting in your home to reduce risk of falls? Yes   ASSISTIVE DEVICES UTILIZED TO PREVENT FALLS:  Life alert? No  Use of a cane, walker or w/c? Yes  Grab bars in the bathroom? Yes  Shower chair or bench in shower? Yes  Elevated toilet seat  or a handicapped toilet? Yes     Cognitive Function:        12/10/2021    2:25 PM  6CIT Screen  What Year? 0 points  What month? 0 points  What time? 0 points  Count back from 20 0 points  Months in reverse 0 points  Repeat phrase 0 points  Total Score 0 points    Immunizations Immunization History  Administered Date(s) Administered   Influenza,inj,quad, With Preservative 11/22/2016   Tdap 01/15/2020    TDAP status: Up to date  Flu Vaccine status: Declined, Education has been provided regarding the importance of this vaccine but patient still declined. Advised may receive this vaccine at local pharmacy or Health Dept. Aware to provide a copy of the vaccination record if obtained from local pharmacy or Health Dept. Verbalized acceptance and understanding.  Pneumococcal vaccine status: Declined,  Education has been provided regarding the importance of this vaccine but patient still declined. Advised may receive this vaccine at local pharmacy or Health Dept. Aware to provide a copy of the vaccination record if obtained from local pharmacy or Health Dept. Verbalized acceptance and understanding.   Covid-19 vaccine status: Completed vaccines  Qualifies for Shingles Vaccine? Yes   Zostavax completed No  Shingrix Completed?: No.    Education has been provided regarding the importance of this vaccine. Patient has been advised to call insurance company to determine out of pocket expense if they have not yet received this vaccine. Advised may also receive vaccine at local pharmacy or Health Dept. Verbalized acceptance and understanding.  Screening Tests Health Maintenance  Topic Date Due   COVID-19 Vaccine (1) Never done   OPHTHALMOLOGY EXAM  Never done   Zoster Vaccines- Shingrix (1 of 2) Never done   Pneumonia Vaccine 83+ Years old (1 - PCV) Never done   INFLUENZA VACCINE  09/22/2021   HEMOGLOBIN A1C  05/19/2022   Diabetic kidney evaluation - GFR measurement  06/23/2022    FOOT EXAM  07/02/2022   Diabetic kidney evaluation - Urine ACR  08/01/2022   TETANUS/TDAP  01/14/2030   HPV VACCINES  Aged Out    Health Maintenance  Health Maintenance Due  Topic Date Due   COVID-19 Vaccine (1) Never done   OPHTHALMOLOGY EXAM  Never done   Zoster Vaccines- Shingrix (1 of 2) Never done   Pneumonia Vaccine 38+ Years old (1 - PCV) Never done   INFLUENZA VACCINE  09/22/2021    Colorectal cancer screening: No longer required.   Lung Cancer Screening: (Low Dose CT Chest recommended if Age 95-80 years, 30 pack-year currently smoking OR have quit w/in 15years.) does not qualify.   Additional Screening:  Hepatitis C Screening: does not qualify; Completed no  Vision Screening: Recommended annual ophthalmology exams for early detection of glaucoma and other disorders of the eye. Is the patient up to date with their annual eye exam?  Yes  Who is the provider or what is the name of the office in which the patient attends annual eye exams? Dr.Gouge If pt is not established with a provider, would they like to be referred to a provider to establish care? No .   Dental Screening: Recommended annual dental exams for proper oral hygiene  Community Resource Referral / Chronic Care Management: CRR required this visit?  No   CCM required this visit?  No      Plan:     I have personally reviewed and noted the following in the patient's chart:   Medical and social history Use of alcohol, tobacco or illicit drugs  Current medications and supplements including opioid prescriptions. Patient is not currently taking opioid prescriptions. Functional ability and status Nutritional status Physical activity Advanced directives List of other physicians Hospitalizations, surgeries, and ER visits in previous 12 months Vitals Screenings to include cognitive, depression, and falls Referrals and appointments  In addition, I have reviewed and discussed with patient certain  preventive protocols, quality metrics, and best practice recommendations. A written personalized care plan for preventive services as well as general preventive health recommendations were provided to patient.     Dionisio David, LPN   58/10/9831   Nurse Notes: none

## 2021-12-10 NOTE — Patient Instructions (Signed)
Jeremy Patton , Thank you for taking time to come for your Medicare Wellness Visit. I appreciate your ongoing commitment to your health goals. Please review the following plan we discussed and let me know if I can assist you in the future.   Screening recommendations/referrals: Colonoscopy: aged out Recommended yearly ophthalmology/optometry visit for glaucoma screening and checkup Recommended yearly dental visit for hygiene and checkup  Vaccinations: Influenza vaccine: n/d Pneumococcal vaccine: n/d Tdap vaccine: 01/15/20 Shingles vaccine: n/d   Covid-19: pt states had shots  Advanced directives: no  Conditions/risks identified: none  Next appointment: Follow up in one year for your annual wellness visit. 12/28/22 @ 8:15 am by phone  Preventive Care 65 Years and Older, Male Preventive care refers to lifestyle choices and visits with your health care provider that can promote health and wellness. What does preventive care include? A yearly physical exam. This is also called an annual well check. Dental exams once or twice a year. Routine eye exams. Ask your health care provider how often you should have your eyes checked. Personal lifestyle choices, including: Daily care of your teeth and gums. Regular physical activity. Eating a healthy diet. Avoiding tobacco and drug use. Limiting alcohol use. Practicing safe sex. Taking low doses of aspirin every day. Taking vitamin and mineral supplements as recommended by your health care provider. What happens during an annual well check? The services and screenings done by your health care provider during your annual well check will depend on your age, overall health, lifestyle risk factors, and family history of disease. Counseling  Your health care provider may ask you questions about your: Alcohol use. Tobacco use. Drug use. Emotional well-being. Home and relationship well-being. Sexual activity. Eating habits. History of  falls. Memory and ability to understand (cognition). Work and work Statistician. Screening  You may have the following tests or measurements: Height, weight, and BMI. Blood pressure. Lipid and cholesterol levels. These may be checked every 5 years, or more frequently if you are over 75 years old. Skin check. Lung cancer screening. You may have this screening every year starting at age 38 if you have a 30-pack-year history of smoking and currently smoke or have quit within the past 15 years. Fecal occult blood test (FOBT) of the stool. You may have this test every year starting at age 72. Flexible sigmoidoscopy or colonoscopy. You may have a sigmoidoscopy every 5 years or a colonoscopy every 10 years starting at age 46. Prostate cancer screening. Recommendations will vary depending on your family history and other risks. Hepatitis C blood test. Hepatitis B blood test. Sexually transmitted disease (STD) testing. Diabetes screening. This is done by checking your blood sugar (glucose) after you have not eaten for a while (fasting). You may have this done every 1-3 years. Abdominal aortic aneurysm (AAA) screening. You may need this if you are a current or former smoker. Osteoporosis. You may be screened starting at age 80 if you are at high risk. Talk with your health care provider about your test results, treatment options, and if necessary, the need for more tests. Vaccines  Your health care provider may recommend certain vaccines, such as: Influenza vaccine. This is recommended every year. Tetanus, diphtheria, and acellular pertussis (Tdap, Td) vaccine. You may need a Td booster every 10 years. Zoster vaccine. You may need this after age 27. Pneumococcal 13-valent conjugate (PCV13) vaccine. One dose is recommended after age 109. Pneumococcal polysaccharide (PPSV23) vaccine. One dose is recommended after age 65. Talk to your  health care provider about which screenings and vaccines you need and  how often you need them. This information is not intended to replace advice given to you by your health care provider. Make sure you discuss any questions you have with your health care provider. Document Released: 03/07/2015 Document Revised: 10/29/2015 Document Reviewed: 12/10/2014 Elsevier Interactive Patient Education  2017 [place] Prevention in the Home Falls can cause injuries. They can happen to people of all ages. There are many things you can do to make your home safe and to help prevent falls. What can I do on the outside of my home? Regularly fix the edges of walkways and driveways and fix any cracks. Remove anything that might make you trip as you walk through a door, such as a raised step or threshold. Trim any bushes or trees on the path to your home. Use bright outdoor lighting. Clear any walking paths of anything that might make someone trip, such as rocks or tools. Regularly check to see if handrails are loose or broken. Make sure that both sides of any steps have handrails. Any raised decks and porches should have guardrails on the edges. Have any leaves, snow, or ice cleared regularly. Use sand or salt on walking paths during winter. Clean up any spills in your garage right away. This includes oil or grease spills. What can I do in the bathroom? Use night lights. Install grab bars by the toilet and in the tub and shower. Do not use towel bars as grab bars. Use non-skid mats or decals in the tub or shower. If you need to sit down in the shower, use a plastic, non-slip stool. Keep the floor dry. Clean up any water that spills on the floor as soon as it happens. Remove soap buildup in the tub or shower regularly. Attach bath mats securely with double-sided non-slip rug tape. Do not have throw rugs and other things on the floor that can make you trip. What can I do in the bedroom? Use night lights. Make sure that you have a light by your bed that is easy to  reach. Do not use any sheets or blankets that are too big for your bed. They should not hang down onto the floor. Have a firm chair that has side arms. You can use this for support while you get dressed. Do not have throw rugs and other things on the floor that can make you trip. What can I do in the kitchen? Clean up any spills right away. Avoid walking on wet floors. Keep items that you use a lot in easy-to-reach places. If you need to reach something above you, use a strong step stool that has a grab bar. Keep electrical cords out of the way. Do not use floor polish or wax that makes floors slippery. If you must use wax, use non-skid floor wax. Do not have throw rugs and other things on the floor that can make you trip. What can I do with my stairs? Do not leave any items on the stairs. Make sure that there are handrails on both sides of the stairs and use them. Fix handrails that are broken or loose. Make sure that handrails are as long as the stairways. Check any carpeting to make sure that it is firmly attached to the stairs. Fix any carpet that is loose or worn. Avoid having throw rugs at the top or bottom of the stairs. If you do have throw rugs, attach them  to the floor with carpet tape. Make sure that you have a light switch at the top of the stairs and the bottom of the stairs. If you do not have them, ask someone to add them for you. What else can I do to help prevent falls? Wear shoes that: Do not have high heels. Have rubber bottoms. Are comfortable and fit you well. Are closed at the toe. Do not wear sandals. If you use a stepladder: Make sure that it is fully opened. Do not climb a closed stepladder. Make sure that both sides of the stepladder are locked into place. Ask someone to hold it for you, if possible. Clearly mark and make sure that you can see: Any grab bars or handrails. First and last steps. Where the edge of each step is. Use tools that help you move  around (mobility aids) if they are needed. These include: Canes. Walkers. Scooters. Crutches. Turn on the lights when you go into a dark area. Replace any light bulbs as soon as they burn out. Set up your furniture so you have a clear path. Avoid moving your furniture around. If any of your floors are uneven, fix them. If there are any pets around you, be aware of where they are. Review your medicines with your doctor. Some medicines can make you feel dizzy. This can increase your chance of falling. Ask your doctor what other things that you can do to help prevent falls. This information is not intended to replace advice given to you by your health care provider. Make sure you discuss any questions you have with your health care provider. Document Released: 12/05/2008 Document Revised: 07/17/2015 Document Reviewed: 03/15/2014 Elsevier Interactive Patient Education  2017 [employer].

## 2021-12-19 DIAGNOSIS — E1165 Type 2 diabetes mellitus with hyperglycemia: Secondary | ICD-10-CM | POA: Diagnosis not present

## 2021-12-24 ENCOUNTER — Other Ambulatory Visit: Payer: Self-pay | Admitting: Family Medicine

## 2021-12-24 ENCOUNTER — Other Ambulatory Visit: Payer: Self-pay | Admitting: Nurse Practitioner

## 2022-01-05 ENCOUNTER — Telehealth: Payer: Self-pay | Admitting: "Endocrinology

## 2022-01-05 NOTE — Telephone Encounter (Signed)
New message    The patient asking for a call back discuss [facility].

## 2022-01-06 NOTE — Telephone Encounter (Signed)
Patient states that to the best of his understanding , he had a message from [facility], it said that in order for the patient to get his diabetic supplies, that he would have to make appointment with [place]. Patient was just seen in October, He is good on all his diabetic supplies.  I told the patient that I would each out to them , it may tomorrow and find out exactly what they may need.

## 2022-01-19 DIAGNOSIS — E1165 Type 2 diabetes mellitus with hyperglycemia: Secondary | ICD-10-CM | POA: Diagnosis not present

## 2022-01-25 ENCOUNTER — Other Ambulatory Visit: Payer: Self-pay | Admitting: Family Medicine

## 2022-01-25 ENCOUNTER — Other Ambulatory Visit: Payer: Self-pay | Admitting: Nurse Practitioner

## 2022-02-18 DIAGNOSIS — E1165 Type 2 diabetes mellitus with hyperglycemia: Secondary | ICD-10-CM | POA: Diagnosis not present

## 2022-02-24 ENCOUNTER — Other Ambulatory Visit: Payer: Self-pay | Admitting: Family Medicine

## 2022-02-25 ENCOUNTER — Other Ambulatory Visit: Payer: Self-pay | Admitting: Family Medicine

## 2022-02-26 ENCOUNTER — Ambulatory Visit: Payer: Medicare PPO | Admitting: Family Medicine

## 2022-02-26 ENCOUNTER — Encounter: Payer: Self-pay | Admitting: Family Medicine

## 2022-02-26 VITALS — BP 144/82 | HR 89 | Temp 96.6°F | Wt 142.2 lb

## 2022-02-26 DIAGNOSIS — E1159 Type 2 diabetes mellitus with other circulatory complications: Secondary | ICD-10-CM

## 2022-02-26 DIAGNOSIS — I10 Essential (primary) hypertension: Secondary | ICD-10-CM | POA: Diagnosis not present

## 2022-02-26 DIAGNOSIS — Z794 Long term (current) use of insulin: Secondary | ICD-10-CM

## 2022-02-26 DIAGNOSIS — E785 Hyperlipidemia, unspecified: Secondary | ICD-10-CM

## 2022-02-26 NOTE — Patient Instructions (Signed)
You're doing well.  Follow up in 6 months.  Take care  Dr. Lacinda Axon

## 2022-03-01 NOTE — Assessment & Plan Note (Signed)
Stable.  Continue current medications.

## 2022-03-01 NOTE — Assessment & Plan Note (Signed)
Fairly well-controlled especially in the setting of advanced age.  Continue metformin, Januvia, Lantus.

## 2022-03-01 NOTE — Assessment & Plan Note (Signed)
At goal on Crestor.  Continue. 

## 2022-03-01 NOTE — Progress Notes (Signed)
Subjective:  Patient ID: Jeremy Patton, male    DOB: May 31, 1940  Age: 82 y.o. MRN: [id number]  CC: Chief Complaint  Patient presents with   Hypertension    Pt arrives for HTN follow up. Has not been checking blood pressure. No s/s HTN.     HPI:  82 year old male with type 2 diabetes, hypertension, carotid artery disease, GERD, hyperlipidemia presents for follow-up.  Patient is doing well.  He is feeling well.  BP is slightly elevated today but given his age, this is a very acceptable blood pressure.  No chest pain or shortness of breath.  He is compliant with verapamil, amlodipine, and olmesartan.  Patient is following with endocrinology regarding his diabetes.  Last A1c was 7.3.  Doing fairly well on Januvia, Lantus, and metformin.  No hypoglycemia.  Lipids continue to be at goal on Crestor.  No reported side effects.  Patient Active Problem List   Diagnosis Date Noted   History of CHF (congestive heart failure) 07/01/2021   Chronic low back pain without sciatica 07/01/2021   Carotid artery disease ([place]) 05/01/2015   Essential hypertension 05/01/2015   Hyperlipidemia LDL goal <70 05/01/2015   Type 2 diabetes mellitus with circulatory disorder, with long-term current use of insulin ([place]) 05/01/2015   GERD (gastroesophageal reflux disease) 11/01/2014    Social Hx   Social History   Socioeconomic History   Marital status: Married    Spouse name: Not on file   Number of children: 4   Years of education: Not on file   Highest education level: Not on file  Occupational History   Occupation: retired  Tobacco Use   Smoking status: Former    Packs/day: 2.00    Years: 30.00    Total pack years: 60.00    Types: Cigarettes    Quit date: 12/17/1991    Years since quitting: 30.2   Smokeless tobacco: Never  Vaping Use   Vaping Use: Never used  Substance and Sexual Activity   Alcohol use: No    Alcohol/week: 0.0 standard drinks of alcohol   Drug use: No   Sexual  activity: Not on file  Other Topics Concern   Not on file  Social History Narrative   Not on file   Social Determinants of Health   Financial Resource Strain: Low Risk  (12/10/2021)   Overall Financial Resource Strain (CARDIA)    Difficulty of Paying Living Expenses: Not hard at all  Food Insecurity: No Food Insecurity (12/10/2021)   Hunger Vital Sign    Worried About Running Out of Food in the Last Year: Never true    Ran Out of Food in the Last Year: Never true  Transportation Needs: No Transportation Needs (12/10/2021)   PRAPARE - Hydrologist (Medical): No    Lack of Transportation (Non-Medical): No  Physical Activity: Insufficiently Active (12/10/2021)   Exercise Vital Sign    Days of Exercise per Week: 3 days    Minutes of Exercise per Session: 30 min  Stress: No Stress Concern Present (12/10/2021)   [place]    Feeling of Stress : Not at all  Social Connections: Moderately Isolated (12/10/2021)   Social Connection and Isolation Panel [NHANES]    Frequency of Communication with Friends and Family: More than three times a week    Frequency of Social Gatherings with Friends and Family: Once a week    Attends Religious Services:  Never    Active Member of Clubs or Organizations: No    Attends Archivist Meetings: Never    Marital Status: Married    Review of Systems Per HPI  Objective:  BP (!) 144/82   Pulse 89   Temp (!) 96.6 F (35.9 C)   Wt 142 lb 3.2 oz (64.5 kg)   SpO2 98%   BMI 21.62 kg/m      02/26/2022    1:43 PM 12/10/2021    2:34 PM 11/26/2021    2:06 PM  BP/Weight  Systolic BP 462  863  Diastolic BP 82  72  Wt. (Lbs) 142.2 145   BMI 21.62 kg/m2 22.05 kg/m2     Physical Exam Vitals and nursing note reviewed.  Constitutional:      General: He is not in acute distress.    Appearance: Normal appearance.  HENT:     Head: Normocephalic and  atraumatic.  Eyes:     General:        Right eye: No discharge.        Left eye: No discharge.     Conjunctiva/sclera: Conjunctivae normal.  Cardiovascular:     Rate and Rhythm: Normal rate and regular rhythm.  Pulmonary:     Effort: Pulmonary effort is normal.     Breath sounds: Normal breath sounds. No wheezing, rhonchi or rales.  Neurological:     Mental Status: He is alert.  Psychiatric:        Mood and Affect: Mood normal.        Behavior: Behavior normal.     Lab Results  Component Value Date   WBC 5.6 06/22/2021   HGB 13.0 06/22/2021   HCT 41.4 06/22/2021   PLT 145 (L) 06/22/2021   GLUCOSE 117 (H) 06/22/2021   CHOL 132 07/01/2021   TRIG 114 07/01/2021   HDL 58 07/01/2021   LDLCALC 54 07/01/2021   ALT 16 06/22/2021   AST 18 06/22/2021   NA 142 06/22/2021   K 4.3 06/22/2021   CL 109 06/22/2021   CREATININE 1.14 06/22/2021   BUN 27 (H) 06/22/2021   CO2 23 06/22/2021   TSH 1.467 06/22/2021   INR 0.98 06/08/2016   HGBA1C 7.3 (A) 11/18/2021   MICROALBUR 30 07/31/2021     Assessment & Plan:   Problem List Items Addressed This Visit       Cardiovascular and Mediastinum   Type 2 diabetes mellitus with circulatory disorder, with long-term current use of insulin ([place])    Fairly well-controlled especially in the setting of advanced age.  Continue metformin, Januvia, Lantus.      Essential hypertension - Primary    Stable.  Continue current medications.        Other   Hyperlipidemia LDL goal <70    At goal on Crestor.  Continue.       Follow-up:  Return in about 6 months (around 08/27/2022).  [place]

## 2022-03-21 DIAGNOSIS — E1165 Type 2 diabetes mellitus with hyperglycemia: Secondary | ICD-10-CM | POA: Diagnosis not present

## 2022-03-22 ENCOUNTER — Other Ambulatory Visit: Payer: Self-pay | Admitting: Family Medicine

## 2022-03-22 ENCOUNTER — Ambulatory Visit: Payer: Medicare PPO | Admitting: Nurse Practitioner

## 2022-03-22 DIAGNOSIS — E1165 Type 2 diabetes mellitus with hyperglycemia: Secondary | ICD-10-CM

## 2022-03-22 DIAGNOSIS — I10 Essential (primary) hypertension: Secondary | ICD-10-CM

## 2022-04-20 ENCOUNTER — Other Ambulatory Visit: Payer: Self-pay | Admitting: Nurse Practitioner

## 2022-04-20 ENCOUNTER — Other Ambulatory Visit: Payer: Self-pay | Admitting: Family Medicine

## 2022-04-21 DIAGNOSIS — E1165 Type 2 diabetes mellitus with hyperglycemia: Secondary | ICD-10-CM | POA: Diagnosis not present

## 2022-04-22 ENCOUNTER — Ambulatory Visit: Payer: Medicare PPO | Admitting: Nurse Practitioner

## 2022-04-22 ENCOUNTER — Encounter: Payer: Self-pay | Admitting: Nurse Practitioner

## 2022-04-22 VITALS — BP 140/70 | HR 92 | Ht 68.0 in | Wt 142.2 lb

## 2022-04-22 DIAGNOSIS — Z794 Long term (current) use of insulin: Secondary | ICD-10-CM | POA: Diagnosis not present

## 2022-04-22 DIAGNOSIS — E1165 Type 2 diabetes mellitus with hyperglycemia: Secondary | ICD-10-CM

## 2022-04-22 DIAGNOSIS — I10 Essential (primary) hypertension: Secondary | ICD-10-CM | POA: Diagnosis not present

## 2022-04-22 LAB — POCT GLYCOSYLATED HEMOGLOBIN (HGB A1C): Hemoglobin A1C: 8 % — AB (ref 4.0–5.6)

## 2022-04-22 MED ORDER — LANTUS SOLOSTAR 100 UNIT/ML ~~LOC~~ SOPN: 10.0000 [IU] | PEN_INJECTOR | Freq: Every day | SUBCUTANEOUS | 0 refills | Status: DC

## 2022-04-22 MED ORDER — METFORMIN HCL 1000 MG PO TABS: 1000.0000 mg | ORAL_TABLET | Freq: Two times a day (BID) | ORAL | 3 refills | Status: DC

## 2022-04-22 MED ORDER — SITAGLIPTIN PHOSPHATE 50 MG PO TABS: 50.0000 mg | ORAL_TABLET | Freq: Every day | ORAL | 3 refills | Status: DC

## 2022-04-22 NOTE — Progress Notes (Signed)
Endocrinology Follow Up Note       04/22/2022, 1:55 PM   Subjective:    Patient ID: Jeremy Patton, male    DOB: [birth year].  Jeremy Patton is being seen in follow up after being seen in consultation for management of currently uncontrolled symptomatic diabetes requested by  Coral Spikes, DO.   Past Medical History:  Diagnosis Date   Arthritis    Carotid artery disease ([place])    a. s/p R CEA in 04/2015 b. 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   Diabetes mellitus without complication ([place]) 0000000   GERD (gastroesophageal reflux disease)    Heart murmur    High cholesterol    Hyperlipidemia 11/29/2011   Hypertension 11/29/2011   Low back pain 11/29/2011   Lumbar radiculopathy 11/29/2011   Left   Pain of left lower leg    Wears glasses     Past Surgical History:  Procedure Laterality Date   CATARACT EXTRACTION W/ INTRAOCULAR LENS  IMPLANT, BILATERAL     CIRCUMCISION     Age 82   COLONOSCOPY  08/22/2005 and  07/31/2009   minimal internal hemorrhoids, left-sided diverticulosis   COLONOSCOPY N/A 11/25/2014   Procedure: COLONOSCOPY;  Surgeon: Daneil Dolin, MD;  [facility];  Service: Endoscopy;  Laterality: N/A;  1030   ENDARTERECTOMY Right 05/06/2015   Procedure: Right Carotid ENDARTERECTOMY ;  Surgeon: Conrad [place], MD;  Location: [place];  Service: Vascular;  Laterality: Right;   ENDARTERECTOMY Left 06/25/2015   Procedure: LEFT CAROTID ENDARTERECTOMY;  Surgeon: Conrad [place], MD;  Location: [place];  Service: Vascular;  Laterality: Left;   ESOPHAGEAL DILATION N/A 11/25/2014   Procedure: ESOPHAGEAL DILATION;  Surgeon: Daneil Dolin, MD;  [facility];  Service: Endoscopy;  Laterality: N/A;   ESOPHAGOGASTRODUODENOSCOPY N/A 11/25/2014   Procedure: ESOPHAGOGASTRODUODENOSCOPY (EGD);  Surgeon: Daneil Dolin, MD;  [facility];  Service: Endoscopy;  Laterality: N/A;   EYE SURGERY     MULTIPLE TOOTH EXTRACTIONS     PATCH ANGIOPLASTY Right 05/06/2015   Procedure: PATCH ANGIOPLASTY;  Surgeon: Conrad Jal, MD;  Location: [place];  Service: Vascular;  Laterality: Right;   PATCH ANGIOPLASTY Left 06/25/2015   Procedure: PATCH ANGIOPLASTY USING XENOSURE BIOLOGIC PATCH 1cm X 6cm ;  Surgeon: Conrad [place], MD;  Location: [facility];  Service: Vascular;  Laterality: Left;   TONSILLECTOMY      Social History   Socioeconomic History   Marital status: Married    Spouse name: Not on file   Number of children: 4   Years of education: Not on file   Highest education level: Not on file  Occupational History   Occupation: retired  Tobacco Use   Smoking status: Former    Packs/day: 2.00    Years: 30.00    Total pack years: 60.00    Types: Cigarettes    Quit date: 12/17/1991    Years since quitting: 30.3   Smokeless tobacco: Never  Vaping Use   Vaping Use: Never used  Substance and Sexual Activity  Alcohol use: No    Alcohol/week: 0.0 standard drinks of alcohol   Drug use: No   Sexual activity: Not on file  Other Topics Concern   Not on file  Social History Narrative   Not on file   Social Determinants of Health   Financial Resource Strain: Low Risk  (12/10/2021)   Overall Financial Resource Strain (CARDIA)    Difficulty of Paying Living Expenses: Not hard at all  Food Insecurity: No Food Insecurity (12/10/2021)   Hunger Vital Sign    Worried About Running Out of Food in the Last Year: Never true    Ran Out of Food in the Last Year: Never true  Transportation Needs: No Transportation Needs (12/10/2021)   PRAPARE - Hydrologist (Medical): No    Lack of Transportation (Non-Medical): No  Physical Activity: Insufficiently Active (12/10/2021)   Exercise Vital Sign    Days of Exercise per Week: 3 days    Minutes of Exercise per Session: 30 min  Stress:  No Stress Concern Present (12/10/2021)   [place]    Feeling of Stress : Not at all  Social Connections: Moderately Isolated (12/10/2021)   Social Connection and Isolation Panel [NHANES]    Frequency of Communication with Friends and Family: More than three times a week    Frequency of Social Gatherings with Friends and Family: Once a week    Attends Religious Services: Never    Marine scientist or Organizations: No    Attends Music therapist: Never    Marital Status: Married    Family History  Problem Relation Age of Onset   Stroke Mother    Heart disease Mother        before age 77   Colon cancer Brother        Age 57    Outpatient Encounter Medications as of 04/22/2022  Medication Sig   ACCU-CHEK GUIDE test strip    Accu-Chek Softclix Lancets lancets    acetaminophen (TYLENOL) 500 MG tablet Take 1,000 mg by mouth 2 (two) times daily.   ALPRAZolam (XANAX) 0.25 MG tablet Take 0.25 mg by mouth daily as needed.   amLODipine (NORVASC) 5 MG tablet Take 1 tablet (5 mg total) by mouth daily.   aspirin 81 MG tablet Take 81 mg by mouth daily.   blood glucose meter kit and supplies KIT Dispense based on patient and insurance preference. Use up to four times daily as directed.   furosemide (LASIX) 40 MG tablet    LANTUS SOLOSTAR 100 UNIT/ML Solostar Pen Inject 10 Units into the skin at bedtime.   metFORMIN (GLUCOPHAGE) 1000 MG tablet Take 1,000 mg by mouth 2 (two) times daily with a meal.   olmesartan (BENICAR) 20 MG tablet TAKE (1) TABLET BY MOUTH ONCE DAILY.   pantoprazole (PROTONIX) 40 MG tablet TAKE (1) TABLET BY MOUTH ONCE DAILY.   potassium chloride SA (KLOR-CON M) 20 MEQ tablet TAKE ONE TABLET BY MOUTH DAILY.   rosuvastatin (CRESTOR) 20 MG tablet TAKE ONE TABLET BY MOUTH ONCE DAILY.   sertraline (ZOLOFT) 25 MG tablet TAKE ONE TABLET BY MOUTH ONCE DAILY.   sitaGLIPtin (JANUVIA) 50 MG tablet TAKE  1 TABLET BY MOUTH ONCE DAILY.   tamsulosin (FLOMAX) 0.4 MG CAPS capsule Take 1 capsule (0.4 mg total) by mouth daily.   triamcinolone cream (KENALOG) 0.1 % Apply 1 application. topically 2 (two) times daily. To the feet.  verapamil (CALAN-SR) 180 MG CR tablet    No facility-administered encounter medications on file as of 04/22/2022.    ALLERGIES: Allergies  Allergen Reactions   Protamine     hypotension    VACCINATION STATUS: Immunization History  Administered Date(s) Administered   Influenza,inj,quad, With Preservative 11/22/2016   Tdap 01/15/2020    Diabetes He presents for his follow-up diabetic visit. He has type 2 diabetes mellitus. Onset time: Diagnosed at approx age of 12. His disease course has been stable. There are no hypoglycemic associated symptoms. Associated symptoms include blurred vision. There are no hypoglycemic complications. Symptoms are stable. Diabetic complications include retinopathy. Risk factors for coronary artery disease include diabetes mellitus, male sex and sedentary lifestyle. Current diabetic treatment includes insulin injections and oral agent (dual therapy). He is compliant with treatment most of the time. His weight is fluctuating minimally. He is following a generally healthy diet. When asked about meal planning, he reported none. He has not had a previous visit with a dietitian. He rarely participates in exercise. His home blood glucose trend is increasing steadily. His overall blood glucose range is 180-200 mg/dl. (He presents today with his CGM showing at goal fasting and slightly above target postprandial readings.  His POCT A1c today is 8%, increasing slightly from last visit of 7.3%.  Analysis of his CGM shows TIR 41%, TAR 68%, TBR <1% with a GMI of 7.9%. ) An ACE inhibitor/angiotensin II receptor blocker is being taken. He does not see a podiatrist.Eye exam is current.     Review of systems  Constitutional: + Minimally fluctuating body weight,  current Body mass index is 21.62 kg/m., no fatigue, no subjective hyperthermia, no subjective hypothermia Eyes: + blurry vision, no xerophthalmia ENT: no sore throat, no nodules palpated in throat, no dysphagia/odynophagia, no hoarseness Cardiovascular: no chest pain, no shortness of breath, no palpitations, no leg swelling Respiratory: no cough, no shortness of breath Gastrointestinal: no nausea/vomiting/diarrhea Musculoskeletal: no muscle/joint aches Skin: no rashes, no hyperemia Neurological: no tremors, no numbness, no tingling, no dizziness Psychiatric: no depression, no anxiety  Objective:     BP (!) 140/70 (BP Location: Left Arm, Patient Position: Sitting, Cuff Size: Normal) Comment: Retake Manuel Cuff  Pulse 92   Ht '5\' 8"'$  (1.727 m)   Wt 142 lb 3.2 oz (64.5 kg)   BMI 21.62 kg/m   Wt Readings from Last 3 Encounters:  04/22/22 142 lb 3.2 oz (64.5 kg)  02/26/22 142 lb 3.2 oz (64.5 kg)  12/10/21 145 lb (65.8 kg)     BP Readings from Last 3 Encounters:  04/22/22 (!) 140/70  02/26/22 (!) 144/82  11/26/21 (!) 140/72      Physical Exam- Limited  Constitutional:  Body mass index is 21.62 kg/m. , not in acute distress, normal state of mind Eyes:  EOMI, no exophthalmos Musculoskeletal: no gross deformities, strength intact in all four extremities, no gross restriction of joint movements Skin:  no rashes, no hyperemia Neurological: no tremor with outstretched hands    CMP ( most recent) CMP     Component Value Date/Time   NA 142 06/22/2021 1534   NA 139 04/30/2015 0813   K 4.3 06/22/2021 1534   CL 109 06/22/2021 1534   CO2 23 06/22/2021 1534   GLUCOSE 117 (H) 06/22/2021 1534   BUN 27 (H) 06/22/2021 1534   BUN 26 04/30/2015 0813   CREATININE 1.14 06/22/2021 1534   CREATININE 1.31 (H) 04/25/2015 1245   CALCIUM 9.5 06/22/2021 1534   PROT  7.3 06/22/2021 1534   PROT 6.9 04/30/2015 0813   ALBUMIN 3.8 06/22/2021 1534   ALBUMIN 4.2 04/30/2015 0813   AST 18  06/22/2021 1534   ALT 16 06/22/2021 1534   ALKPHOS 51 06/22/2021 1534   BILITOT 0.5 06/22/2021 1534   BILITOT 0.3 04/30/2015 0813   GFRNONAA >60 06/22/2021 1534   GFRAA 47 (L) 06/14/2016 0505     Diabetic Labs (most recent): Lab Results  Component Value Date   HGBA1C 8.0 (A) 04/22/2022   HGBA1C 7.3 (A) 11/18/2021   HGBA1C 8.3 (H) 07/01/2021   MICROALBUR 30 07/31/2021     Lipid Panel ( most recent) Lipid Panel     Component Value Date/Time   CHOL 132 07/01/2021 1548   TRIG 114 07/01/2021 1548   HDL 58 07/01/2021 1548   CHOLHDL 2.3 07/01/2021 1548   LDLCALC 54 07/01/2021 1548   LABVLDL 20 07/01/2021 1548      Lab Results  Component Value Date   TSH 1.467 06/22/2021   TSH 0.857 06/08/2016   TSH 1.250 04/30/2015           Assessment & Plan:   1) Type 2 diabetes mellitus with hyperglycemia, with long-term current use of insulin ([place])  He presents today with his CGM showing at goal fasting and slightly above target postprandial readings.  His POCT A1c today is 8%, increasing slightly from last visit of 7.3%.  Analysis of his CGM shows TIR 41%, TAR 68%, TBR <1% with a GMI of 7.9%.   - PLEASANT DOBIN has currently uncontrolled symptomatic type 2 DM since 82 years of age.   -Recent labs reviewed.  - I had a long discussion with him about the progressive nature of diabetes and the pathology behind its complications. -his diabetes is complicated by CHF and he remains at a high risk for more acute and chronic complications which include CAD, CVA, CKD, retinopathy, and neuropathy. These are all discussed in detail with him.  The following Lifestyle Medicine recommendations according to [facility]) were discussed and offered to patient and he agrees to start the journey:  A. Whole Foods, Plant-based plate comprising of fruits and vegetables, plant-based proteins, whole-grain carbohydrates was discussed in detail with the patient.   A  list for source of those nutrients were also provided to the patient.  Patient will use only water or unsweetened tea for hydration. B.  The need to stay away from risky substances including alcohol, smoking; obtaining 7 to 9 hours of restorative sleep, at least 150 minutes of moderate intensity exercise weekly, the importance of healthy social connections,  and stress reduction techniques were discussed. C.  A full color page of  Calorie density of various food groups per pound showing examples of each food groups was provided to the patient.  - Nutritional counseling repeated at each appointment due to patients tendency to fall back in to old habits.  - The patient admits there is a room for improvement in their diet and drink choices. -  Suggestion is made for the patient to avoid simple carbohydrates from their diet including Cakes, Sweet Desserts / Pastries, Ice Cream, Soda (diet and regular), Sweet Tea, Candies, Chips, Cookies, Sweet Pastries, Store Bought Juices, Alcohol in Excess of 1-2 drinks a day, Artificial Sweeteners, Coffee Creamer, and "Sugar-free" Products. This will help patient to have stable blood glucose profile and potentially avoid unintended weight gain.   - I encouraged the patient to switch to unprocessed or  minimally processed complex starch and increased protein intake (animal or plant source), fruits, and vegetables.   - Patient is advised to stick to a routine mealtimes to eat 3 meals a day and avoid unnecessary snacks (to snack only to correct hypoglycemia).  - I have approached him with the following individualized plan to manage his diabetes and patient agrees:   -He is advised to increase his Lantus to 10 units SQ nightly, continue Januvia 50 mg po daily, and Metformin 1000 mg po twice daily with meals.   -he is encouraged to continue monitoring glucose 4 times daily (using his CGM), before meals and before bed, and to call the clinic if he has readings less than 70  or above 300 for 3 tests in a row.  He is benefiting from CGM device, is advised to continue using it.  - he is warned not to take insulin without proper monitoring per orders. - Adjustment parameters are given to him for hypo and hyperglycemia in writing.  -He is not ideal candidate for incretin therapy due to body habitus and BMI of 22.  - Specific targets for  A1c; LDL, HDL, and Triglycerides were discussed with the patient.  2) Blood Pressure /Hypertension:  his blood pressure is not controlled to target today.   he is advised to continue his current medications including Olmesartan 20 mg po daily.  3) Lipids/Hyperlipidemia:    Review of his recent lipid panel from 07/01/21 showed controlled LDL at 54 .  he is advised to continue Crestor 20 mg daily at bedtime.  Side effects and precautions discussed with him.  Will recheck lipid panel prior to next visit.  4)  Weight/Diet:  his Body mass index is 21.62 kg/m.  -  he is NOT a candidate for weight loss.   Exercise, and detailed carbohydrates information provided  -  detailed on discharge instructions.  5) Chronic Care/Health Maintenance: -he is on ACEI/ARB and Statin medications and is encouraged to initiate and continue to follow up with Ophthalmology, Dentist, Podiatrist at least yearly or according to recommendations, and advised to stay away from smoking. I have recommended yearly flu vaccine and pneumonia vaccine at least every 5 years; moderate intensity exercise for up to 150 minutes weekly; and sleep for at least 7 hours a day.  - he is advised to maintain close follow up with Coral Spikes, DO for primary care needs, as well as his other providers for optimal and coordinated care.      I spent  30  minutes in the care of the patient today including review of labs from [place], Lipids, Thyroid Function, Hematology (current and previous including abstractions from other facilities); face-to-face time discussing  his blood glucose  readings/logs, discussing hypoglycemia and hyperglycemia episodes and symptoms, medications doses, his options of short and long term treatment based on the latest standards of care / guidelines;  discussion about incorporating lifestyle medicine;  and documenting the encounter. Risk reduction counseling performed per USPSTF guidelines to reduce obesity and cardiovascular risk factors.     Please refer to Patient Instructions for Blood Glucose Monitoring and Insulin/Medications Dosing Guide"  in media tab for additional information. Please  also refer to " Patient Self Inventory" in the Media  tab for reviewed elements of pertinent patient history.  Margarito Courser participated in the discussions, expressed understanding, and voiced agreement with the above plans.  All questions were answered to his satisfaction. he is encouraged to contact clinic should he have  any questions or concerns prior to his return visit.     Follow up plan: - Return in about 4 months (around 08/21/2022) for Diabetes F/U with A1c in office, Previsit labs, Bring meter and logs.   Rayetta Pigg, [facility] [address] [place], [place] [address] Phone: [phone] Fax: [phone]  04/22/2022, 1:55 PM

## 2022-04-22 NOTE — Patient Instructions (Signed)
HAPPY BIRTHDAY, MR. Jeremy Patton!!!

## 2022-05-10 ENCOUNTER — Ambulatory Visit: Payer: Medicare PPO | Admitting: Family Medicine

## 2022-05-10 ENCOUNTER — Encounter: Payer: Self-pay | Admitting: Family Medicine

## 2022-05-10 VITALS — BP 158/68 | HR 84 | Temp 97.3°F | Ht 68.0 in | Wt 144.0 lb

## 2022-05-10 DIAGNOSIS — R413 Other amnesia: Secondary | ICD-10-CM | POA: Insufficient documentation

## 2022-05-10 DIAGNOSIS — R4586 Emotional lability: Secondary | ICD-10-CM

## 2022-05-10 DIAGNOSIS — I10 Essential (primary) hypertension: Secondary | ICD-10-CM | POA: Diagnosis not present

## 2022-05-10 MED ORDER — AMLODIPINE BESYLATE 10 MG PO TABS: 10.0000 mg | ORAL_TABLET | Freq: Every day | ORAL | 3 refills | Status: DC

## 2022-05-10 MED ORDER — SERTRALINE HCL 50 MG PO TABS: 50.0000 mg | ORAL_TABLET | Freq: Every day | ORAL | 3 refills | Status: DC

## 2022-05-10 NOTE — Progress Notes (Addendum)
Subjective:  Patient ID: Jeremy Patton, male    DOB: [birth year]  Age: 82 y.o. MRN: [id number]  CC: Chief Complaint  Patient presents with   mental health concern    Little interest or pleasure in doing things, difficult in dealing w others, irritable    HPI:  82 year old male with carotid artery disease, hypertension, type 2 diabetes, GERD, chronic low back pain, history of CHF, hyperlipidemia presents for evaluation of the above.  Patient is accompanied by his daughter today.  Patient states that he is feeling well.  He has no concerns.  Daughter states that she has noticed that he has decreased interest in doing things.  He has been more irritable and easily frustrated when dealing with his wife.  Daughter also reports that he has had a recent occurrence where he seemed to be confused about where he was.  She states that they both went to [place] and when he came back on there was some confusion about whether he was actually at home or whether he was at [employer].  Apparently there has been another family member that has expressed concern.  Patient states that he does have some difficulty remembering things.  The daughter states that he does not have any difficulty doing his normal tasks or doing routine activities.  She is quite concerned.  Patient Active Problem List   Diagnosis Date Noted   Memory change 05/10/2022   Mood disturbance 05/10/2022   History of CHF (congestive heart failure) 07/01/2021   Chronic low back pain without sciatica 07/01/2021   Carotid artery disease ([place]) 05/01/2015   Essential hypertension 05/01/2015   Hyperlipidemia LDL goal <70 05/01/2015   Type 2 diabetes mellitus with circulatory disorder, with long-term current use of insulin ([place]) 05/01/2015   GERD (gastroesophageal reflux disease) 11/01/2014    Social Hx   Social History   Socioeconomic History   Marital status: Married    Spouse name: Not on file   Number of children: 4   Years of  education: Not on file   Highest education level: Not on file  Occupational History   Occupation: retired  Tobacco Use   Smoking status: Former    Packs/day: 2.00    Years: 30.00    Additional pack years: 0.00    Total pack years: 60.00    Types: Cigarettes    Quit date: 12/17/1991    Years since quitting: 30.4   Smokeless tobacco: Never  Vaping Use   Vaping Use: Never used  Substance and Sexual Activity   Alcohol use: No    Alcohol/week: 0.0 standard drinks of alcohol   Drug use: No   Sexual activity: Not on file  Other Topics Concern   Not on file  Social History Narrative   Not on file   Social Determinants of Health   Financial Resource Strain: Low Risk  (12/10/2021)   Overall Financial Resource Strain (CARDIA)    Difficulty of Paying Living Expenses: Not hard at all  Food Insecurity: No Food Insecurity (12/10/2021)   Hunger Vital Sign    Worried About Running Out of Food in the Last Year: Never true    Ran Out of Food in the Last Year: Never true  Transportation Needs: No Transportation Needs (12/10/2021)   PRAPARE - Hydrologist (Medical): No    Lack of Transportation (Non-Medical): No  Physical Activity: Insufficiently Active (12/10/2021)   Exercise Vital Sign    Days of Exercise per Week:  3 days    Minutes of Exercise per Session: 30 min  Stress: No Stress Concern Present (12/10/2021)   [place]    Feeling of Stress : Not at all  Social Connections: Moderately Isolated (12/10/2021)   Social Connection and Isolation Panel [NHANES]    Frequency of Communication with Friends and Family: More than three times a week    Frequency of Social Gatherings with Friends and Family: Once a week    Attends Religious Services: Never    Printmaker: No    Attends Music therapist: Never    Marital Status: Married    Review of  Systems Per HPI  Objective:  BP (!) 158/68   Pulse 84   Temp (!) 97.3 F (36.3 C)   Ht 5\' 8"  (1.727 m)   Wt 144 lb (65.3 kg)   SpO2 98%   BMI 21.90 kg/m      05/10/2022   10:37 AM 04/22/2022    1:34 PM 04/22/2022    1:29 PM  BP/Weight  Systolic BP 0000000 [id number]  Diastolic BP 68 70 73  Wt. (Lbs) 144  142.2  BMI 21.9 kg/m2  21.62 kg/m2    Physical Exam Vitals and nursing note reviewed.  Constitutional:      General: He is not in acute distress.    Appearance: Normal appearance.  HENT:     Head: Normocephalic and atraumatic.  Eyes:     General:        Right eye: No discharge.        Left eye: No discharge.     Conjunctiva/sclera: Conjunctivae normal.  Cardiovascular:     Rate and Rhythm: Normal rate and regular rhythm.  Pulmonary:     Effort: Pulmonary effort is normal.     Breath sounds: Normal breath sounds. No wheezing, rhonchi or rales.  Neurological:     Mental Status: He is alert.  Psychiatric:        Mood and Affect: Mood normal.        Behavior: Behavior normal.     Lab Results  Component Value Date   WBC 5.6 06/22/2021   HGB 13.0 06/22/2021   HCT 41.4 06/22/2021   PLT 145 (L) 06/22/2021   GLUCOSE 117 (H) 06/22/2021   CHOL 132 07/01/2021   TRIG 114 07/01/2021   HDL 58 07/01/2021   LDLCALC 54 07/01/2021   ALT 16 06/22/2021   AST 18 06/22/2021   NA 142 06/22/2021   K 4.3 06/22/2021   CL 109 06/22/2021   CREATININE 1.14 06/22/2021   BUN 27 (H) 06/22/2021   CO2 23 06/22/2021   TSH 1.467 06/22/2021   INR 0.98 06/08/2016   HGBA1C 8.0 (A) 04/22/2022   MICROALBUR 30 07/31/2021     Assessment & Plan:   Problem List Items Addressed This Visit       Cardiovascular and Mediastinum   Essential hypertension    BP but here today.  Increasing amlodipine.  Stopping verapamil.      Relevant Medications   amLODipine (NORVASC) 10 MG tablet     Other   Memory change - Primary    Concerns regarding memory and forgetfulness.  Referring to neurology  for formal evaluation.  Depression could certainly be playing a role.      Relevant Orders   Ambulatory referral to Neurology   Mood disturbance    Increasing Zoloft.  Meds ordered this encounter  Medications   amLODipine (NORVASC) 10 MG tablet    Sig: Take 1 tablet (10 mg total) by mouth daily.    Dispense:  90 tablet    Refill:  3   sertraline (ZOLOFT) 50 MG tablet    Sig: Take 1 tablet (50 mg total) by mouth daily.    Dispense:  90 tablet    Refill:  3    Follow-up:  Return in about 3 months (around 08/10/2022).  [place]

## 2022-05-10 NOTE — Patient Instructions (Signed)
I have increased the Sertraline.  Referral placed.  I changed the amlodipine. Stop Verapamil.  Follow up in 3 months.

## 2022-05-10 NOTE — Assessment & Plan Note (Signed)
Increasing Zoloft.

## 2022-05-10 NOTE — Assessment & Plan Note (Signed)
BP but here today.  Increasing amlodipine.  Stopping verapamil.

## 2022-05-10 NOTE — Assessment & Plan Note (Signed)
Concerns regarding memory and forgetfulness.  Referring to neurology for formal evaluation.  Depression could certainly be playing a role.

## 2022-05-20 DIAGNOSIS — E1165 Type 2 diabetes mellitus with hyperglycemia: Secondary | ICD-10-CM | POA: Diagnosis not present

## 2022-05-21 ENCOUNTER — Other Ambulatory Visit: Payer: Self-pay | Admitting: Family Medicine

## 2022-06-02 DIAGNOSIS — H40052 Ocular hypertension, left eye: Secondary | ICD-10-CM | POA: Diagnosis not present

## 2022-06-02 DIAGNOSIS — Z961 Presence of intraocular lens: Secondary | ICD-10-CM | POA: Diagnosis not present

## 2022-06-02 DIAGNOSIS — H35033 Hypertensive retinopathy, bilateral: Secondary | ICD-10-CM | POA: Diagnosis not present

## 2022-06-02 DIAGNOSIS — H524 Presbyopia: Secondary | ICD-10-CM | POA: Diagnosis not present

## 2022-06-20 DIAGNOSIS — E1165 Type 2 diabetes mellitus with hyperglycemia: Secondary | ICD-10-CM | POA: Diagnosis not present

## 2022-06-21 ENCOUNTER — Telehealth: Payer: Self-pay | Admitting: Family Medicine

## 2022-06-21 ENCOUNTER — Other Ambulatory Visit: Payer: Self-pay | Admitting: Family Medicine

## 2022-06-21 MED ORDER — POTASSIUM CHLORIDE CRYS ER 20 MEQ PO TBCR: 20.0000 meq | EXTENDED_RELEASE_TABLET | Freq: Every day | ORAL | 0 refills | Status: DC

## 2022-06-21 NOTE — Telephone Encounter (Signed)
Received via fax Rx request: Prescription sent electronically to pharmacy  

## 2022-06-21 NOTE — Telephone Encounter (Signed)
Refill on potassium chloride SA (KLOR-CON M) 20 MEQ tabl  [employer]

## 2022-06-30 DIAGNOSIS — H40052 Ocular hypertension, left eye: Secondary | ICD-10-CM | POA: Diagnosis not present

## 2022-07-05 ENCOUNTER — Ambulatory Visit: Payer: Medicare PPO | Admitting: Neurology

## 2022-07-05 ENCOUNTER — Encounter: Payer: Self-pay | Admitting: Neurology

## 2022-07-05 VITALS — BP 149/77 | HR 86 | Ht 69.0 in | Wt 141.0 lb

## 2022-07-05 DIAGNOSIS — G3184 Mild cognitive impairment, so stated: Secondary | ICD-10-CM | POA: Diagnosis not present
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
## 2022-07-05 NOTE — Patient Instructions (Addendum)
Dementia labs including B12, TSH and ATN profile, I will contact you to go over the results Continue current medications Follow up in a year or sooner if worse    There are well-accepted and sensible ways to reduce risk for Alzheimers disease and other degenerative brain disorders .  Exercise Daily Walk A daily 20 minute walk should be part of your routine. Disease related apathy can be a significant roadblock to exercise and the only way to overcome this is to make it a daily routine and perhaps have a reward at the end (something your loved one loves to eat or drink perhaps) or a personal trainer coming to the home can also be very useful. Most importantly, the patient is much more likely to exercise if the caregiver / spouse does it with him/her. In general a structured, repetitive schedule is best.  General Health: Any diseases which effect your body will effect your brain such as a pneumonia, urinary infection, blood clot, heart attack or stroke. Keep contact with your primary care doctor for regular follow ups.  Sleep. A good nights sleep is healthy for the brain. Seven hours is recommended. If you have insomnia or poor sleep habits we can give you some instructions. If you have sleep apnea wear your mask.  Diet: Eating a heart healthy diet is also a good idea; fish and poultry instead of red meat, nuts (mostly non-peanuts), vegetables, fruits, olive oil or canola oil (instead of butter), minimal salt (use other spices to flavor foods), whole grain rice, bread, cereal and pasta and wine in moderation.Research is now showing that the MIND diet, which is a combination of The Mediterranean diet and the DASH diet, is beneficial for cognitive processing and longevity. Information about this diet can be found in The MIND Diet, a book by Alonna Minium, MS, RDN, and online at [employer]  Finances, Power of [address] and Advance Directives: You should consider putting  legal safeguards in place with regard to financial and medical decision making. While the spouse always has power of attorney for medical and financial issues in the absence of any form, you should consider what you want in case the spouse / caregiver is no longer around or capable of making decisions.

## 2022-07-06 LAB — ATN PROFILE

## 2022-07-07 LAB — ATN PROFILE

## 2022-07-08 LAB — ATN PROFILE
Beta-amyloid 42: 22.14 pg/mL
T -- p-tau181: 1.39 pg/mL — ABNORMAL HIGH (ref 0.00–0.97)

## 2022-07-08 LAB — TSH: TSH: 1.89 u[IU]/mL (ref 0.450–4.500)

## 2022-07-08 LAB — VITAMIN B12: Vitamin B-12: 1121 pg/mL (ref 232–1245)

## 2022-07-13 NOTE — Progress Notes (Signed)
Spoke with patient, discussed labs results showing evidence of Alzheimer disease biomarker. 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 We discussed initiating medication and he would like to hold for now.  Follow up as scheduled.

## 2022-07-14 ENCOUNTER — Telehealth: Payer: Self-pay | Admitting: Neurology

## 2022-07-14 NOTE — Telephone Encounter (Signed)
Called and spoke w/terri and relayed msg and she voiced understanding.

## 2022-07-14 NOTE — Telephone Encounter (Signed)
Pt daughter called. Stated that pt said Dr. Teresa Coombs called  him, Stated he don't remember what they talked about. She is asking if Dr. Teresa Coombs can call her.

## 2022-07-14 NOTE — Telephone Encounter (Signed)
This was regarding ATN profile showing AD biomarkers: "Spoke with patient, discussed labs results showing evidence of Alzheimer disease biomarker. 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 We discussed initiating medication and he would like to hold for now. Follow up as scheduled."

## 2022-07-20 DIAGNOSIS — E1165 Type 2 diabetes mellitus with hyperglycemia: Secondary | ICD-10-CM | POA: Diagnosis not present

## 2022-07-22 ENCOUNTER — Other Ambulatory Visit: Payer: Self-pay | Admitting: Urology

## 2022-07-29 ENCOUNTER — Ambulatory Visit: Payer: Medicare PPO | Admitting: Urology

## 2022-07-29 DIAGNOSIS — R972 Elevated prostate specific antigen [PSA]: Secondary | ICD-10-CM

## 2022-07-29 DIAGNOSIS — N138 Other obstructive and reflux uropathy: Secondary | ICD-10-CM

## 2022-08-20 ENCOUNTER — Other Ambulatory Visit: Payer: Self-pay | Admitting: Urology

## 2022-08-20 DIAGNOSIS — E1165 Type 2 diabetes mellitus with hyperglycemia: Secondary | ICD-10-CM | POA: Diagnosis not present

## 2022-08-23 ENCOUNTER — Other Ambulatory Visit: Payer: Self-pay | Admitting: Urology

## 2022-08-23 MED ORDER — TAMSULOSIN HCL 0.4 MG PO CAPS: ORAL_CAPSULE | ORAL | 1 refills | Status: DC

## 2022-08-24 ENCOUNTER — Ambulatory Visit: Payer: Medicare PPO | Admitting: Nurse Practitioner

## 2022-08-27 ENCOUNTER — Ambulatory Visit: Payer: Medicare PPO | Admitting: Family Medicine

## 2022-08-30 NOTE — Telephone Encounter (Signed)
Patient scheduled with Maralyn Sago on 08/13.  I called and left a voicemail for patient to call back and confirm apt.

## 2022-08-31 DIAGNOSIS — E1165 Type 2 diabetes mellitus with hyperglycemia: Secondary | ICD-10-CM | POA: Diagnosis not present

## 2022-08-31 DIAGNOSIS — Z794 Long term (current) use of insulin: Secondary | ICD-10-CM | POA: Diagnosis not present

## 2022-09-01 ENCOUNTER — Encounter: Payer: Self-pay | Admitting: Nurse Practitioner

## 2022-09-01 ENCOUNTER — Ambulatory Visit: Payer: Medicare PPO | Admitting: Nurse Practitioner

## 2022-09-01 VITALS — BP 140/65 | HR 88 | Ht 69.0 in | Wt 142.6 lb

## 2022-09-01 DIAGNOSIS — I10 Essential (primary) hypertension: Secondary | ICD-10-CM

## 2022-09-01 DIAGNOSIS — E1165 Type 2 diabetes mellitus with hyperglycemia: Secondary | ICD-10-CM

## 2022-09-01 DIAGNOSIS — Z7984 Long term (current) use of oral hypoglycemic drugs: Secondary | ICD-10-CM | POA: Diagnosis not present

## 2022-09-01 DIAGNOSIS — Z794 Long term (current) use of insulin: Secondary | ICD-10-CM

## 2022-09-01 LAB — LIPID PANEL
Chol/HDL Ratio: 1.9 ratio (ref 0.0–5.0)
Cholesterol, Total: 137 mg/dL (ref 100–199)
HDL: 73 mg/dL (ref >39)
LDL Chol Calc (NIH): 43 mg/dL (ref 0–99)
Triglycerides: 125 mg/dL (ref 0–149)
VLDL Cholesterol Cal: 21 mg/dL (ref 5–40)

## 2022-09-01 LAB — COMPREHENSIVE METABOLIC PANEL
ALT: 6 IU/L (ref 0–44)
AST: 13 IU/L (ref 0–40)
Albumin: 4.1 g/dL (ref 3.7–4.7)
Alkaline Phosphatase: 56 IU/L (ref 44–121)
BUN/Creatinine Ratio: 20 (ref 10–24)
BUN: 19 mg/dL (ref 8–27)
Bilirubin Total: 0.2 mg/dL (ref 0.0–1.2)
CO2: 21 mmol/L (ref 20–29)
Calcium: 9.3 mg/dL (ref 8.6–10.2)
Chloride: 103 mmol/L (ref 96–106)
Creatinine, Ser: 0.96 mg/dL (ref 0.76–1.27)
Globulin, Total: 2.6 g/dL (ref 1.5–4.5)
Glucose: 122 mg/dL — ABNORMAL HIGH (ref 70–99)
Potassium: 4.8 mmol/L (ref 3.5–5.2)
Sodium: 139 mmol/L (ref 134–144)
Total Protein: 6.7 g/dL (ref 6.0–8.5)
eGFR: 78 mL/min/{1.73_m2} (ref >59)

## 2022-09-01 LAB — POCT GLYCOSYLATED HEMOGLOBIN (HGB A1C): Hemoglobin A1C: 7.8 % — AB (ref 4.0–5.6)

## 2022-09-01 LAB — VITAMIN D 25 HYDROXY (VIT D DEFICIENCY, FRACTURES): Vit D, 25-Hydroxy: 26.3 ng/mL — ABNORMAL LOW (ref 30.0–100.0)

## 2022-09-01 LAB — T4, FREE: Free T4: 1.15 ng/dL (ref 0.82–1.77)

## 2022-09-01 LAB — TSH: TSH: 1.4 u[IU]/mL (ref 0.450–4.500)

## 2022-09-01 MED ORDER — LANTUS SOLOSTAR 100 UNIT/ML ~~LOC~~ SOPN: 12.0000 [IU] | PEN_INJECTOR | Freq: Every day | SUBCUTANEOUS | 0 refills | Status: DC

## 2022-09-01 NOTE — Progress Notes (Signed)
Endocrinology Follow Up Note       09/01/2022, 11:39 AM   Subjective:    Patient ID: Jeremy Patton, male    DOB: 06/17/1940.  Jeremy Patton is being seen in follow up after being seen in consultation for management of currently uncontrolled symptomatic diabetes requested by  Tommie Sams, DO.   Past Medical History:  Diagnosis Date   Arthritis    Carotid artery disease (HCC)    a. s/p R CEA in 04/2015 b. 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   Diabetes mellitus without complication (HCC) 11/29/2011   GERD (gastroesophageal reflux disease)    Heart murmur    High cholesterol    Hyperlipidemia 11/29/2011   Hypertension 11/29/2011   Low back pain 11/29/2011   Lumbar radiculopathy 11/29/2011   Left   Pain of left lower leg    Wears glasses     Past Surgical History:  Procedure Laterality Date   CATARACT EXTRACTION W/ INTRAOCULAR LENS  IMPLANT, BILATERAL     CIRCUMCISION     Age 18   COLONOSCOPY  08/22/2005 and  07/31/2009   minimal internal hemorrhoids, left-sided diverticulosis   COLONOSCOPY N/A 11/25/2014   Procedure: COLONOSCOPY;  Surgeon: Corbin Ade, MD;  [facility];  Service: Endoscopy;  Laterality: N/A;  1030   ENDARTERECTOMY Right 05/06/2015   Procedure: Right Carotid ENDARTERECTOMY ;  Surgeon: Fransisco Hertz, MD;  Location: [facility] OR;  Service: Vascular;  Laterality: Right;   ENDARTERECTOMY Left 06/25/2015   Procedure: LEFT CAROTID ENDARTERECTOMY;  Surgeon: Fransisco Hertz, MD;  Location: [facility] OR;  Service: Vascular;  Laterality: Left;   ESOPHAGEAL DILATION N/A 11/25/2014   Procedure: ESOPHAGEAL DILATION;  Surgeon: Corbin Ade, MD;  Location: [facility];  Service: Endoscopy;  Laterality: N/A;   ESOPHAGOGASTRODUODENOSCOPY N/A 11/25/2014   Procedure: ESOPHAGOGASTRODUODENOSCOPY (EGD);  Surgeon: Corbin Ade, MD;  Location: [facility];  Service: Endoscopy;  Laterality: N/A;   EYE SURGERY     MULTIPLE TOOTH EXTRACTIONS     PATCH ANGIOPLASTY Right 05/06/2015   Procedure: PATCH ANGIOPLASTY;  Surgeon: Fransisco Hertz, MD;  Location: [facility] OR;  Service: Vascular;  Laterality: Right;   PATCH ANGIOPLASTY Left 06/25/2015   Procedure: PATCH ANGIOPLASTY USING XENOSURE BIOLOGIC PATCH 1cm X 6cm ;  Surgeon: Fransisco Hertz, MD;  Location: [facility] OR;  Service: Vascular;  Laterality: Left;   TONSILLECTOMY      Social History   Socioeconomic History   Marital status: Married    Spouse name: Not on file   Number of children: 4   Years of education: Not on file   Highest education level: Not on file  Occupational History   Occupation: retired  Tobacco Use   Smoking status: Former    Packs/day: 2.00    Years: 30.00    Additional pack years: 0.00    Total pack years: 60.00    Types: Cigarettes    Quit date: 12/17/1991    Years since quitting: 30.7   Smokeless tobacco: Never  Vaping Use   Vaping Use: Never  used  Substance and Sexual Activity   Alcohol use: Yes    Alcohol/week: 1.0 standard drink of alcohol    Types: 1 Standard drinks or equivalent per week   Drug use: Yes    Types: Marijuana   Sexual activity: Yes    Birth control/protection: None  Other Topics Concern   Not on file  Social History Narrative   Not on file   Social Determinants of Health   Financial Resource Strain: Low Risk  (12/10/2021)   Overall Financial Resource Strain (CARDIA)    Difficulty of Paying Living Expenses: Not hard at all  Food Insecurity: No Food Insecurity (12/10/2021)   Hunger Vital Sign    Worried About Running Out of Food in the Last Year: Never true    Ran Out of Food in the Last Year: Never true  Transportation Needs: No Transportation Needs (12/10/2021)   PRAPARE - Administrator, Civil Service (Medical): No    Lack of Transportation (Non-Medical): No  Physical Activity: Insufficiently  Active (12/10/2021)   Exercise Vital Sign    Days of Exercise per Week: 3 days    Minutes of Exercise per Session: 30 min  Stress: No Stress Concern Present (12/10/2021)   [employer] of Occupational Health - Occupational Stress Questionnaire    Feeling of Stress : Not at all  Social Connections: Moderately Isolated (12/10/2021)   Social Connection and Isolation Panel [NHANES]    Frequency of Communication with Friends and Family: More than three times a week    Frequency of Social Gatherings with Friends and Family: Once a week    Attends Religious Services: Never    Database administrator or Organizations: No    Attends Engineer, structural: Never    Marital Status: Married    Family History  Problem Relation Age of Onset   Stroke Mother    Heart disease Mother        before age 62   Colon cancer Brother        Age 40    Outpatient Encounter Medications as of 09/01/2022  Medication Sig   ACCU-CHEK GUIDE test strip    Accu-Chek Softclix Lancets lancets    acetaminophen (TYLENOL) 500 MG tablet Take 1,000 mg by mouth 2 (two) times daily.   amLODipine (NORVASC) 10 MG tablet Take 1 tablet (10 mg total) by mouth daily.   aspirin 81 MG tablet Take 81 mg by mouth daily.   blood glucose meter kit and supplies KIT Dispense based on patient and insurance preference. Use up to four times daily as directed.   furosemide (LASIX) 40 MG tablet    metFORMIN (GLUCOPHAGE) 1000 MG tablet Take 1 tablet (1,000 mg total) by mouth 2 (two) times daily with a meal.   olmesartan (BENICAR) 20 MG tablet TAKE (1) TABLET BY MOUTH ONCE DAILY.   pantoprazole (PROTONIX) 40 MG tablet TAKE (1) TABLET BY MOUTH ONCE DAILY.   potassium chloride SA (KLOR-CON M) 20 MEQ tablet Take 1 tablet (20 mEq total) by mouth daily.   rosuvastatin (CRESTOR) 20 MG tablet TAKE ONE TABLET BY MOUTH ONCE DAILY.   sertraline (ZOLOFT) 50 MG tablet Take 1 tablet (50 mg total) by mouth daily.   sitaGLIPtin (JANUVIA) 50 MG  tablet Take 1 tablet (50 mg total) by mouth daily.   tamsulosin (FLOMAX) 0.4 MG CAPS capsule TAKE (1) CAPSULE BY MOUTH ONCE DAILY.   [DISCONTINUED] LANTUS SOLOSTAR 100 UNIT/ML Solostar Pen Inject 10 Units into the  skin at bedtime.   ALPRAZolam (XANAX) 0.25 MG tablet Take 0.25 mg by mouth daily as needed. (Patient not taking: Reported on 09/01/2022)   LANTUS SOLOSTAR 100 UNIT/ML Solostar Pen Inject 12 Units into the skin at bedtime.   No facility-administered encounter medications on file as of 09/01/2022.    ALLERGIES: Allergies  Allergen Reactions   Protamine     hypotension    VACCINATION STATUS: Immunization History  Administered Date(s) Administered   Influenza,inj,quad, With Preservative 11/22/2016   Tdap 01/15/2020    Diabetes He presents for his follow-up diabetic visit. He has type 2 diabetes mellitus. Onset time: Diagnosed at approx age of 30. His disease course has been improving. There are no hypoglycemic associated symptoms. Associated symptoms include blurred vision. There are no hypoglycemic complications. Symptoms are stable. Diabetic complications include retinopathy. Risk factors for coronary artery disease include diabetes mellitus, male sex and sedentary lifestyle. Current diabetic treatment includes insulin injections and oral agent (dual therapy). He is compliant with treatment most of the time. His weight is fluctuating minimally. He is following a generally healthy diet. When asked about meal planning, he reported none. He has not had a previous visit with a dietitian. He rarely participates in exercise. His home blood glucose trend is decreasing steadily. His overall blood glucose range is 140-180 mg/dl. (He presents today with his CGM showing at goal fasting and slightly above target postprandial readings.  His POCT A1c today is 7.8%, improving from last visit of 8%.  Analysis of his CGM shows TIR 51%, TAR 48%, TBR 1% with a GMI of 7.6%.  He did increase his Lantus to  12 units SQ with breakfast due to higher readings.  He notes only mild hypoglycemia occasionally.) An ACE inhibitor/angiotensin II receptor blocker is being taken. He does not see a podiatrist.Eye exam is current.     Review of systems  Constitutional: + Minimally fluctuating body weight, current Body mass index is 21.06 kg/m., no fatigue, no subjective hyperthermia, no subjective hypothermia Eyes: + blurry vision, no xerophthalmia ENT: no sore throat, no nodules palpated in throat, no dysphagia/odynophagia, no hoarseness Cardiovascular: no chest pain, no shortness of breath, no palpitations, no leg swelling Respiratory: no cough, no shortness of breath Gastrointestinal: no nausea/vomiting/diarrhea Musculoskeletal: no muscle/joint aches Skin: no rashes, no hyperemia Neurological: no tremors, no numbness, no tingling, no dizziness Psychiatric: no depression, no anxiety  Objective:     BP (!) 140/65 (BP Location: Left Arm, Patient Position: Sitting, Cuff Size: Normal)   Pulse 88   Ht 5\' 9"  (1.753 m)   Wt 142 lb 9.6 oz (64.7 kg)   BMI 21.06 kg/m   Wt Readings from Last 3 Encounters:  09/01/22 142 lb 9.6 oz (64.7 kg)  07/05/22 141 lb (64 kg)  05/10/22 144 lb (65.3 kg)     BP Readings from Last 3 Encounters:  09/01/22 (!) 140/65  07/05/22 (!) 149/77  05/10/22 (!) 158/68      Physical Exam- Limited  Constitutional:  Body mass index is 21.06 kg/m. , not in acute distress, normal state of mind Eyes:  EOMI, no exophthalmos Musculoskeletal: no gross deformities, strength intact in all four extremities, no gross restriction of joint movements Skin:  no rashes, no hyperemia Neurological: no tremor with outstretched hands    CMP ( most recent) CMP     Component Value Date/Time   NA 139 08/31/2022 1356   K 4.8 08/31/2022 1356   CL 103 08/31/2022 1356   CO2 21 08/31/2022  1356   GLUCOSE 122 (H) 08/31/2022 1356   GLUCOSE 117 (H) 06/22/2021 1534   BUN 19 08/31/2022 1356    CREATININE 0.96 08/31/2022 1356   CREATININE 1.31 (H) 04/25/2015 1245   CALCIUM 9.3 08/31/2022 1356   PROT 6.7 08/31/2022 1356   ALBUMIN 4.1 08/31/2022 1356   AST 13 08/31/2022 1356   ALT 6 08/31/2022 1356   ALKPHOS 56 08/31/2022 1356   BILITOT 0.2 08/31/2022 1356   GFRNONAA >60 06/22/2021 1534   GFRAA 47 (L) 06/14/2016 0505     Diabetic Labs (most recent): Lab Results  Component Value Date   HGBA1C 7.8 (A) 09/01/2022   HGBA1C 8.0 (A) 04/22/2022   HGBA1C 7.3 (A) 11/18/2021   MICROALBUR 30 07/31/2021     Lipid Panel ( most recent) Lipid Panel     Component Value Date/Time   CHOL 137 08/31/2022 1356   TRIG 125 08/31/2022 1356   HDL 73 08/31/2022 1356   CHOLHDL 1.9 08/31/2022 1356   LDLCALC 43 08/31/2022 1356   LABVLDL 21 08/31/2022 1356      Lab Results  Component Value Date   TSH 1.400 08/31/2022   TSH 1.890 07/05/2022   TSH 1.467 06/22/2021   TSH 0.857 06/08/2016   TSH 1.250 04/30/2015   FREET4 1.15 08/31/2022           Assessment & Plan:   1) Type 2 diabetes mellitus with hyperglycemia, with long-term current use of insulin (HCC)  He presents today with his CGM showing at goal fasting and slightly above target postprandial readings.  His POCT A1c today is 7.8%, improving from last visit of 8%.  Analysis of his CGM shows TIR 51%, TAR 48%, TBR 1% with a GMI of 7.6%.  He did increase his Lantus to 12 units SQ with breakfast due to higher readings.  He notes only mild hypoglycemia occasionally.  - Jeremy Patton has currently uncontrolled symptomatic type 2 DM since 82 years of age.   -Recent labs reviewed.  - I had a long discussion with him about the progressive nature of diabetes and the pathology behind its complications. -his diabetes is complicated by CHF and he remains at a high risk for more acute and chronic complications which include CAD, CVA, CKD, retinopathy, and neuropathy. These are all discussed in detail with him.  The following  Lifestyle Medicine recommendations according to American College of Lifestyle Medicine [facility]) were discussed and offered to patient and he agrees to start the journey:  A. Whole Foods, Plant-based plate comprising of fruits and vegetables, plant-based proteins, whole-grain carbohydrates was discussed in detail with the patient.   A list for source of those nutrients were also provided to the patient.  Patient will use only water or unsweetened tea for hydration. B.  The need to stay away from risky substances including alcohol, smoking; obtaining 7 to 9 hours of restorative sleep, at least 150 minutes of moderate intensity exercise weekly, the importance of healthy social connections,  and stress reduction techniques were discussed. C.  A full color page of  Calorie density of various food groups per pound showing examples of each food groups was provided to the patient.  - Nutritional counseling repeated at each appointment due to patients tendency to fall back in to old habits.  - The patient admits there is a room for improvement in their diet and drink choices. -  Suggestion is made for the patient to avoid simple carbohydrates from their diet including Cakes, Sweet Desserts /  Pastries, Ice Cream, Soda (diet and regular), Sweet Tea, Candies, Chips, Cookies, Sweet Pastries, Store Bought Juices, Alcohol in Excess of 1-2 drinks a day, Artificial Sweeteners, Coffee Creamer, and "Sugar-free" Products. This will help patient to have stable blood glucose profile and potentially avoid unintended weight gain.   - I encouraged the patient to switch to unprocessed or minimally processed complex starch and increased protein intake (animal or plant source), fruits, and vegetables.   - Patient is advised to stick to a routine mealtimes to eat 3 meals a day and avoid unnecessary snacks (to snack only to correct hypoglycemia).  - I have approached him with the following individualized plan to manage his diabetes  and patient agrees:   -He is advised to continue Lantus 12 units SQ nightly, continue Januvia 50 mg po daily, and Metformin 1000 mg po twice daily with meals.  He is encouraged to reach out to me if he has hypoglycemia on 2 separate occasions in a week so we can decrease insulin.  -he is encouraged to continue monitoring glucose 4 times daily (using his CGM), before meals and before bed, and to call the clinic if he has readings less than 70 or above 300 for 3 tests in a row.  He is benefiting from CGM device, is advised to continue using it.  - he is warned not to take insulin without proper monitoring per orders. - Adjustment parameters are given to him for hypo and hyperglycemia in writing.  -He is not ideal candidate for incretin therapy due to body habitus and BMI of 21.  - Specific targets for  A1c; LDL, HDL, and Triglycerides were discussed with the patient.  2) Blood Pressure /Hypertension:  his blood pressure is controlled to target for his age and comorbidities.   he is advised to continue his current medications including Olmesartan 20 mg po daily.  3) Lipids/Hyperlipidemia:    Review of his recent lipid panel from 08/31/22 showed controlled LDL at 43.  he is advised to continue Crestor 20 mg daily at bedtime.  Side effects and precautions discussed with him.    4)  Weight/Diet:  his Body mass index is 21.06 kg/m.  -  he is NOT a candidate for weight loss.   Exercise, and detailed carbohydrates information provided  -  detailed on discharge instructions.  5) Chronic Care/Health Maintenance: -he is on ACEI/ARB and Statin medications and is encouraged to initiate and continue to follow up with Ophthalmology, Dentist, Podiatrist at least yearly or according to recommendations, and advised to stay away from smoking. I have recommended yearly flu vaccine and pneumonia vaccine at least every 5 years; moderate intensity exercise for up to 150 minutes weekly; and sleep for at least 7 hours a  day.  6) Vitamin D deficiency His most recent vitamin D level on 08/31/22 was slightly low at 26.3.  He does not take any supplements for this.  He does not need supplementation at this time.   - he is advised to maintain close follow up with Tommie Sams, DO for primary care needs, as well as his other providers for optimal and coordinated care.      I spent  40  minutes in the care of the patient today including review of labs from CMP, Lipids, Thyroid Function, Hematology (current and previous including abstractions from other facilities); face-to-face time discussing  his blood glucose readings/logs, discussing hypoglycemia and hyperglycemia episodes and symptoms, medications doses, his options of short and long term  treatment based on the latest standards of care / guidelines;  discussion about incorporating lifestyle medicine;  and documenting the encounter. Risk reduction counseling performed per USPSTF guidelines to reduce obesity and cardiovascular risk factors.     Please refer to Patient Instructions for Blood Glucose Monitoring and Insulin/Medications Dosing Guide"  in media tab for additional information. Please  also refer to " Patient Self Inventory" in the Media  tab for reviewed elements of pertinent patient history.  Jeremy Patton participated in the discussions, expressed understanding, and voiced agreement with the above plans.  All questions were answered to his satisfaction. he is encouraged to contact clinic should he have any questions or concerns prior to his return visit.     Follow up plan: - Return in about 4 months (around 01/02/2023) for Diabetes F/U with A1c in office, No previsit labs, Bring meter and logs.   Ronny Bacon, [facility] [address] [place], [place] [address] Phone: [phone] Fax: [phone]  09/01/2022, 11:39 AM

## 2022-09-07 ENCOUNTER — Ambulatory Visit: Payer: Medicare PPO | Admitting: Family Medicine

## 2022-09-07 VITALS — BP 157/60 | HR 89 | Temp 97.5°F | Ht 69.0 in | Wt 141.2 lb

## 2022-09-07 DIAGNOSIS — E785 Hyperlipidemia, unspecified: Secondary | ICD-10-CM

## 2022-09-07 DIAGNOSIS — Z794 Long term (current) use of insulin: Secondary | ICD-10-CM | POA: Diagnosis not present

## 2022-09-07 DIAGNOSIS — I10 Essential (primary) hypertension: Secondary | ICD-10-CM | POA: Diagnosis not present

## 2022-09-07 DIAGNOSIS — E1159 Type 2 diabetes mellitus with other circulatory complications: Secondary | ICD-10-CM

## 2022-09-07 DIAGNOSIS — F0280 Dementia in other diseases classified elsewhere without behavioral disturbance: Secondary | ICD-10-CM | POA: Diagnosis not present

## 2022-09-07 DIAGNOSIS — R21 Rash and other nonspecific skin eruption: Secondary | ICD-10-CM | POA: Diagnosis not present

## 2022-09-07 DIAGNOSIS — G309 Alzheimer's disease, unspecified: Secondary | ICD-10-CM | POA: Diagnosis not present

## 2022-09-07 MED ORDER — CLOBETASOL PROPIONATE 0.05 % EX OINT: 1.0000 | TOPICAL_OINTMENT | Freq: Two times a day (BID) | CUTANEOUS | 3 refills | Status: DC

## 2022-09-07 MED ORDER — OLMESARTAN MEDOXOMIL 40 MG PO TABS: 40.0000 mg | ORAL_TABLET | Freq: Every day | ORAL | 1 refills | Status: DC

## 2022-09-07 NOTE — Patient Instructions (Addendum)
Labs in 7-10 days. ? ?Follow up in 1 month. ? ?Take care ? ?Dr. Lacinda Axon  ?

## 2022-09-08 ENCOUNTER — Encounter: Payer: Self-pay | Admitting: Family Medicine

## 2022-09-08 DIAGNOSIS — R21 Rash and other nonspecific skin eruption: Secondary | ICD-10-CM | POA: Insufficient documentation

## 2022-09-08 DIAGNOSIS — F0280 Dementia in other diseases classified elsewhere without behavioral disturbance: Secondary | ICD-10-CM | POA: Insufficient documentation

## 2022-09-08 NOTE — Assessment & Plan Note (Signed)
 At goal on Crestor.  Continue. 

## 2022-09-08 NOTE — Assessment & Plan Note (Signed)
Uncontrolled.  Increasing Benicar.  Follow-up labs ordered.

## 2022-09-08 NOTE — Progress Notes (Signed)
Subjective:  Patient ID: Jeremy Patton, male    DOB: 01-24-1941  Age: 82 y.o. MRN: [id number]  CC:  Follow up   HPI:  82 year old male with carotid artery disease, type 2 diabetes, hypertension, GERD, recently diagnosed Alzheimer's disease, chronic low back pain, hyperlipidemia presents for follow-up.  Patient's blood pressure elevated today.  Patient on olmesartan 20 mg daily, and amlodipine milligrams daily.  Patient requesting refill on topical cream which has been used for dry, hyperpigmented rash on the feet.  He is following closely with endocrinology regarding his type 2 diabetes.  Last A1c 7.8.  He is on metformin, Januvia, and Lantus.  No reports of hypoglycemia.  Lipids remain at goal on Crestor.   Patient Active Problem List   Diagnosis Date Noted   Alzheimer's disease (HCC) 09/08/2022   Rash 09/08/2022   Mood disturbance 05/10/2022   Chronic low back pain without sciatica 07/01/2021   Carotid artery disease (HCC) 05/01/2015   Essential hypertension 05/01/2015   Hyperlipidemia LDL goal <70 05/01/2015   Type 2 diabetes mellitus with circulatory disorder, with long-term current use of insulin (HCC) 05/01/2015   GERD (gastroesophageal reflux disease) 11/01/2014    Social Hx   Social History   Socioeconomic History   Marital status: Married    Spouse name: Not on file   Number of children: 4   Years of education: Not on file   Highest education level: Not on file  Occupational History   Occupation: retired  Tobacco Use   Smoking status: Former    Current packs/day: 0.00    Average packs/day: 2.0 packs/day for 30.0 years (60.0 ttl pk-yrs)    Types: Cigarettes    Start date: 12/16/1961    Quit date: 12/17/1991    Years since quitting: 30.7   Smokeless tobacco: Never  Vaping Use   Vaping status: Never Used  Substance and Sexual Activity   Alcohol use: Yes    Alcohol/week: 1.0 standard drink of alcohol    Types: 1 Standard drinks or equivalent per  week   Drug use: Yes    Types: Marijuana   Sexual activity: Yes    Birth control/protection: None  Other Topics Concern   Not on file  Social History Narrative   Not on file   Social Determinants of Health   Financial Resource Strain: Low Risk  (12/10/2021)   Overall Financial Resource Strain (CARDIA)    Difficulty of Paying Living Expenses: Not hard at all  Food Insecurity: No Food Insecurity (12/10/2021)   Hunger Vital Sign    Worried About Running Out of Food in the Last Year: Never true    Ran Out of Food in the Last Year: Never true  Transportation Needs: No Transportation Needs (12/10/2021)   PRAPARE - Administrator, Civil Service (Medical): No    Lack of Transportation (Non-Medical): No  Physical Activity: Insufficiently Active (12/10/2021)   Exercise Vital Sign    Days of Exercise per Week: 3 days    Minutes of Exercise per Session: 30 min  Stress: No Stress Concern Present (12/10/2021)   [employer] of Occupational Health - Occupational Stress Questionnaire    Feeling of Stress : Not at all  Social Connections: Moderately Isolated (12/10/2021)   Social Connection and Isolation Panel [NHANES]    Frequency of Communication with Friends and Family: More than three times a week    Frequency of Social Gatherings with Friends and Family: Once a week    Attends  Religious Services: Never    Active Member of Clubs or Organizations: No    Attends Banker Meetings: Never    Marital Status: Married    Review of Systems Per HPI  Objective:  BP (!) 157/60   Pulse 89   Temp (!) 97.5 F (36.4 C)   Ht 5\' 9"  (1.753 m)   Wt 141 lb 3.2 oz (64 kg)   SpO2 96%   BMI 20.85 kg/m      09/07/2022   11:51 AM 09/07/2022   11:23 AM 09/01/2022   10:57 AM  BP/Weight  Systolic BP 157 164 140  Diastolic BP 60 78 65  Wt. (Lbs)  141.2 142.6  BMI  20.85 kg/m2 21.06 kg/m2    Physical Exam Constitutional:      General: He is not in acute distress.     Appearance: Normal appearance.  HENT:     Head: Normocephalic and atraumatic.  Eyes:     General:        Right eye: No discharge.        Left eye: No discharge.     Conjunctiva/sclera: Conjunctivae normal.  Cardiovascular:     Rate and Rhythm: Normal rate and regular rhythm.  Pulmonary:     Effort: Pulmonary effort is normal.     Breath sounds: Normal breath sounds. No wheezing, rhonchi or rales.  Skin:    Comments: Dryness and flaking skin noted on the plantar aspect of the feet as well as the lateral aspects of both feet.  Neurological:     Mental Status: He is alert.  Psychiatric:        Mood and Affect: Mood normal.        Behavior: Behavior normal.     Lab Results  Component Value Date   WBC 5.6 06/22/2021   HGB 13.0 06/22/2021   HCT 41.4 06/22/2021   PLT 145 (L) 06/22/2021   GLUCOSE 122 (H) 08/31/2022   CHOL 137 08/31/2022   TRIG 125 08/31/2022   HDL 73 08/31/2022   LDLCALC 43 08/31/2022   ALT 6 08/31/2022   AST 13 08/31/2022   NA 139 08/31/2022   K 4.8 08/31/2022   CL 103 08/31/2022   CREATININE 0.96 08/31/2022   BUN 19 08/31/2022   CO2 21 08/31/2022   TSH 1.400 08/31/2022   INR 0.98 06/08/2016   HGBA1C 7.8 (A) 09/01/2022   MICROALBUR 30 07/31/2021     Assessment & Plan:   Problem List Items Addressed This Visit       Cardiovascular and Mediastinum   Type 2 diabetes mellitus with circulatory disorder, with long-term current use of insulin (HCC)   Relevant Medications   olmesartan (BENICAR) 40 MG tablet   Other Relevant Orders   Microalbumin/Creatinine Ratio, Urine   Essential hypertension - Primary    Uncontrolled.  Increasing Benicar.  Follow-up labs ordered.      Relevant Medications   olmesartan (BENICAR) 40 MG tablet   Other Relevant Orders   Basic Metabolic Panel     Nervous and Auditory   Alzheimer's disease (HCC)     Musculoskeletal and Integument   Rash    Clobetasol refilled.        Other   Hyperlipidemia LDL goal <70     At goal on Crestor.  Continue.      Relevant Medications   olmesartan (BENICAR) 40 MG tablet    Meds ordered this encounter  Medications   olmesartan (BENICAR) 40 MG tablet  Sig: Take 1 tablet (40 mg total) by mouth daily.    Dispense:  90 tablet    Refill:  1   clobetasol ointment (TEMOVATE) 0.05 %    Sig: Apply 1 Application topically 2 (two) times daily.    Dispense:  60 g    Refill:  3    Follow-up:  Return in about 1 month (around 10/08/2022).  Everlene Other DO [facility]

## 2022-09-08 NOTE — Assessment & Plan Note (Signed)
Clobetasol refilled.

## 2022-09-15 ENCOUNTER — Telehealth: Payer: Self-pay | Admitting: *Deleted

## 2022-09-15 NOTE — Telephone Encounter (Signed)
Have him lower his Lantus to 8 units nightly.  This will help prevent waking up with low sugars.

## 2022-09-15 NOTE — Telephone Encounter (Signed)
Patient left a message that he had experienced two lows with his blood sugar. One was last Saturday and the other was last Wednesday.  I have called the patient. I got his daughter she is going to call him and get the readings. Patient's daughter called back , she shares that her Dad's readings were 66 , this was early in the mornings. This is for last Saturday and Wednesday.

## 2022-09-16 NOTE — Telephone Encounter (Signed)
Called and made his daughter aware.She will let her dad know to lower the Lantus to 8 units at night.

## 2022-09-19 DIAGNOSIS — E1165 Type 2 diabetes mellitus with hyperglycemia: Secondary | ICD-10-CM | POA: Diagnosis not present

## 2022-09-20 ENCOUNTER — Other Ambulatory Visit: Payer: Self-pay | Admitting: Family Medicine

## 2022-10-05 ENCOUNTER — Ambulatory Visit: Payer: Medicare PPO | Admitting: Urology

## 2022-10-05 ENCOUNTER — Encounter: Payer: Self-pay | Admitting: Urology

## 2022-10-05 VITALS — BP 161/67 | HR 94 | Temp 98.6°F

## 2022-10-05 DIAGNOSIS — N401 Enlarged prostate with lower urinary tract symptoms: Secondary | ICD-10-CM | POA: Insufficient documentation

## 2022-10-05 DIAGNOSIS — R3912 Poor urinary stream: Secondary | ICD-10-CM

## 2022-10-05 LAB — URINALYSIS, ROUTINE W REFLEX MICROSCOPIC
Bilirubin, UA: NEGATIVE
Leukocytes,UA: NEGATIVE
Nitrite, UA: NEGATIVE
RBC, UA: NEGATIVE
Specific Gravity, UA: 1.025 (ref 1.005–1.030)
Urobilinogen, Ur: 1 mg/dL (ref 0.2–1.0)
pH, UA: 6 (ref 5.0–7.5)

## 2022-10-05 LAB — BLADDER SCAN AMB NON-IMAGING: Scan Result: 43

## 2022-10-05 MED ORDER — TAMSULOSIN HCL 0.4 MG PO CAPS
ORAL_CAPSULE | ORAL | 11 refills | Status: DC
Start: 2022-10-05 — End: 2022-12-30

## 2022-10-05 NOTE — Progress Notes (Signed)
Name: Jeremy Patton DOB: 08/15/1940 MRN: [id number]  History of Present Illness: Mr. Jeremy Patton is a 82 y.o. male who presents today for return visit at [facility]. - GU history: 1. 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 - Taking Flomax.  - Cystoscopy on 03/06/2020 showed BPH with BOO.  2. Elevated PSA. - Per Dr. Belva Crome note on 07/23/2021: "His PSA is 4.9 which is acceptable for his age and lower than the only prior level I can find. His exam is benign. He doesn't need further PSA's."  At last visit with Dr. Annabell Howells on 07/23/2021: - BPH well managed with Flomax. - Advised to "cut out diet sodas" re: dysuria / penile pain with negative UA.  Today: He reports doing well. He denies urinary hesitancy, urgency, frequency, dysuria, gross hematuria, straining to void, or sensations of incomplete emptying.   Fall Screening: Do you usually have a device to assist in your mobility? Yes -cane   Medications: Current Outpatient Medications  Medication Sig Dispense Refill   ACCU-CHEK GUIDE test strip      Accu-Chek Softclix Lancets lancets      acetaminophen (TYLENOL) 500 MG tablet Take 1,000 mg by mouth 2 (two) times daily.     amLODipine (NORVASC) 10 MG tablet Take 1 tablet (10 mg total) by mouth daily. 90 tablet 3   aspirin 81 MG tablet Take 81 mg by mouth daily.     blood glucose meter kit and supplies KIT Dispense based on patient and insurance preference. Use up to four times daily as directed. 1 each 0   clobetasol ointment (TEMOVATE) 0.05 % Apply 1 Application topically 2 (two) times daily. 60 g 3   LANTUS SOLOSTAR 100 UNIT/ML Solostar Pen Inject 12 Units into the skin at bedtime. 36 mL 0   metFORMIN (GLUCOPHAGE) 1000 MG tablet Take 1 tablet (1,000 mg total) by mouth 2 (two) times daily with a meal. 180 tablet 3   olmesartan (BENICAR) 40 MG tablet Take 1 tablet (40 mg total) by mouth daily. 90 tablet 1   pantoprazole (PROTONIX) 40 MG tablet TAKE (1) TABLET BY MOUTH  ONCE DAILY. 30 tablet 5   potassium chloride SA (KLOR-CON M) 20 MEQ tablet TAKE ONE TABLET BY MOUTH DAILY. 90 tablet 0   rosuvastatin (CRESTOR) 20 MG tablet TAKE ONE TABLET BY MOUTH ONCE DAILY. 90 tablet 0   sertraline (ZOLOFT) 50 MG tablet Take 1 tablet (50 mg total) by mouth daily. 90 tablet 3   sitaGLIPtin (JANUVIA) 50 MG tablet Take 1 tablet (50 mg total) by mouth daily. 90 tablet 3   tamsulosin (FLOMAX) 0.4 MG CAPS capsule TAKE (1) CAPSULE BY MOUTH ONCE DAILY. 30 capsule 11   No current facility-administered medications for this visit.    Allergies: Allergies  Allergen Reactions   Protamine     hypotension    Past Medical History:  Diagnosis Date   Arthritis    Carotid artery disease (HCC)    a. s/p R CEA in 04/2015 b. 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   Diabetes mellitus without complication (HCC) 11/29/2011   GERD (gastroesophageal reflux disease)    Heart murmur    High cholesterol    History of CHF (congestive heart failure) 07/01/2021   Hyperlipidemia 11/29/2011   Hypertension 11/29/2011   Low back pain 11/29/2011   Lumbar radiculopathy 11/29/2011   Left   Pain of left  lower leg    Wears glasses    Past Surgical History:  Procedure Laterality Date   CATARACT EXTRACTION W/ INTRAOCULAR LENS  IMPLANT, BILATERAL     CIRCUMCISION     Age 41   COLONOSCOPY  08/22/2005 and  07/31/2009   minimal internal hemorrhoids, left-sided diverticulosis   COLONOSCOPY N/A 11/25/2014   Procedure: COLONOSCOPY;  Surgeon: Corbin Ade, MD;  [facility]: [facility];  Service: Endoscopy;  Laterality: N/A;  1030   ENDARTERECTOMY Right 05/06/2015   Procedure: Right Carotid ENDARTERECTOMY ;  Surgeon: Fransisco Hertz, MD;  Location: [facility] OR;  Service: Vascular;  Laterality: Right;   ENDARTERECTOMY Left 06/25/2015   Procedure: LEFT CAROTID ENDARTERECTOMY;  Surgeon: Fransisco Hertz, MD;  Location: [facility] OR;  Service:  Vascular;  Laterality: Left;   ESOPHAGEAL DILATION N/A 11/25/2014   Procedure: ESOPHAGEAL DILATION;  Surgeon: Corbin Ade, MD;  Location: [facility];  Service: Endoscopy;  Laterality: N/A;   ESOPHAGOGASTRODUODENOSCOPY N/A 11/25/2014   Procedure: ESOPHAGOGASTRODUODENOSCOPY (EGD);  Surgeon: Corbin Ade, MD;  Location: [facility];  Service: Endoscopy;  Laterality: N/A;   EYE SURGERY     MULTIPLE TOOTH EXTRACTIONS     PATCH ANGIOPLASTY Right 05/06/2015   Procedure: PATCH ANGIOPLASTY;  Surgeon: Fransisco Hertz, MD;  Location: [facility] OR;  Service: Vascular;  Laterality: Right;   PATCH ANGIOPLASTY Left 06/25/2015   Procedure: PATCH ANGIOPLASTY USING XENOSURE BIOLOGIC PATCH 1cm X 6cm ;  Surgeon: Fransisco Hertz, MD;  Location: [facility] OR;  Service: Vascular;  Laterality: Left;   TONSILLECTOMY     Family History  Problem Relation Age of Onset   Stroke Mother    Heart disease Mother        before age 82   Colon cancer Brother        Age 82   Social History   Socioeconomic History   Marital status: Married    Spouse name: Not on file   Number of children: 4   Years of education: Not on file   Highest education level: Not on file  Occupational History   Occupation: retired  Tobacco Use   Smoking status: Former    Current packs/day: 0.00    Average packs/day: 2.0 packs/day for 30.0 years (60.0 ttl pk-yrs)    Types: Cigarettes    Start date: 12/16/1961    Quit date: 12/17/1991    Years since quitting: 30.8   Smokeless tobacco: Never  Vaping Use   Vaping status: Never Used  Substance and Sexual Activity   Alcohol use: Yes    Alcohol/week: 1.0 standard drink of alcohol    Types: 1 Standard drinks or equivalent per week   Drug use: Yes    Types: Marijuana   Sexual activity: Yes    Birth control/protection: None  Other Topics Concern   Not on file  Social History Narrative   Not on file   Social Determinants of Health   Financial Resource Strain: Low Risk  (12/10/2021)   Overall  Financial Resource Strain (CARDIA)    Difficulty of Paying Living Expenses: Not hard at all  Food Insecurity: No Food Insecurity (12/10/2021)   Hunger Vital Sign    Worried About Running Out of Food in the Last Year: Never true    Ran Out of Food in the Last Year: Never true  Transportation Needs: No Transportation Needs (12/10/2021)   PRAPARE - Transportation    Lack of Transportation (Medical): No    Lack of  Transportation (Non-Medical): No  Physical Activity: Insufficiently Active (12/10/2021)   Exercise Vital Sign    Days of Exercise per Week: 3 days    Minutes of Exercise per Session: 30 min  Stress: No Stress Concern Present (12/10/2021)   [employer] of Occupational Health - Occupational Stress Questionnaire    Feeling of Stress : Not at all  Social Connections: Moderately Isolated (12/10/2021)   Social Connection and Isolation Panel [NHANES]    Frequency of Communication with Friends and Family: More than three times a week    Frequency of Social Gatherings with Friends and Family: Once a week    Attends Religious Services: Never    Database administrator or Organizations: No    Attends Banker Meetings: Never    Marital Status: Married  Catering manager Violence: Not At Risk (12/10/2021)   Humiliation, Afraid, Rape, and Kick questionnaire    Fear of Current or Ex-Partner: No    Emotionally Abused: No    Physically Abused: No    Sexually Abused: No    Review of Systems Constitutional: Patient denies any unintentional weight loss or change in strength lntegumentary: Patient denies any rashes or pruritus Cardiovascular: Patient denies chest pain or syncope Respiratory: Patient denies shortness of breath Gastrointestinal: Patient denies nausea, vomiting, constipation, or diarrhea Musculoskeletal: Patient denies muscle cramps or weakness Neurologic: Patient denies convulsions or seizures Psychiatric: Patient denies memory problems Allergic/Immunologic:  Patient denies recent allergic reaction(s) Hematologic/Lymphatic: Patient denies bleeding tendencies Endocrine: Patient denies heat/cold intolerance  GU: As per HPI.  OBJECTIVE Vitals:   10/05/22 1120 10/05/22 1125  BP: (!) 168/72 (!) 161/67  Pulse: 94   Temp: 98.6 F (37 C)    There is no height or weight on file to calculate BMI.  Physical Examination  Constitutional: No obvious distress; patient is non-toxic appearing  Cardiovascular: No visible lower extremity edema.  Respiratory: The patient does not have audible wheezing/stridor; respirations do not appear labored  Gastrointestinal: Abdomen non-distended Musculoskeletal: Normal ROM of UEs  Skin: No obvious rashes/open sores  Neurologic: CN 2-12 grossly intact Psychiatric: Answered questions appropriately with normal affect  Hematologic/Lymphatic/Immunologic: No obvious bruises or sites of spontaneous bleeding  UA: no evidence of UTI or microscopic hematuria PVR: 43 ml  ASSESSMENT Benign prostatic hyperplasia with weak urinary stream - Plan: Urinalysis, Routine w reflex microscopic, BLADDER SCAN AMB NON-IMAGING, tamsulosin (FLOMAX) 0.4 MG CAPS capsule  He is doing well; will continue current treatment regimen. Refills sent. Will plan for follow up in 1 year or sooner if needed. Pt verbalized understanding and agreement. All questions were answered.  PLAN Advised the following: 1. Continue Flomax 0.4 mg daily. 2. Return in about 1 year (around 10/05/2023) for UA, PVR, & f/u with Evette Georges NP.  Orders Placed This Encounter  Procedures   Urinalysis, Routine w reflex microscopic   BLADDER SCAN AMB NON-IMAGING    It has been explained that the patient is to follow regularly with their PCP in addition to all other providers involved in their care and to follow instructions provided by these respective offices. Patient advised to contact urology clinic if any urologic-pertaining questions, concerns, new symptoms or  problems arise in the interim period.  There are no Patient Instructions on file for this visit.  Electronically signed by:  Donnita Falls, FNP   10/05/22    11:52 AM

## 2022-10-05 NOTE — Progress Notes (Signed)
post void residual= 43 

## 2022-10-08 ENCOUNTER — Ambulatory Visit: Payer: Medicare PPO | Admitting: Family Medicine

## 2022-10-13 ENCOUNTER — Ambulatory Visit: Payer: Medicare PPO | Admitting: Family Medicine

## 2022-10-20 ENCOUNTER — Other Ambulatory Visit: Payer: Self-pay | Admitting: Family Medicine

## 2022-10-20 DIAGNOSIS — E1165 Type 2 diabetes mellitus with hyperglycemia: Secondary | ICD-10-CM | POA: Diagnosis not present

## 2022-10-21 ENCOUNTER — Ambulatory Visit (INDEPENDENT_AMBULATORY_CARE_PROVIDER_SITE_OTHER): Payer: Medicare PPO | Admitting: Family Medicine

## 2022-10-21 VITALS — BP 149/63 | HR 80 | Temp 97.7°F | Ht 69.0 in | Wt 140.2 lb

## 2022-10-21 DIAGNOSIS — E1159 Type 2 diabetes mellitus with other circulatory complications: Secondary | ICD-10-CM | POA: Diagnosis not present

## 2022-10-21 DIAGNOSIS — R42 Dizziness and giddiness: Secondary | ICD-10-CM | POA: Diagnosis not present

## 2022-10-21 DIAGNOSIS — I10 Essential (primary) hypertension: Secondary | ICD-10-CM

## 2022-10-21 DIAGNOSIS — E785 Hyperlipidemia, unspecified: Secondary | ICD-10-CM

## 2022-10-21 DIAGNOSIS — Z794 Long term (current) use of insulin: Secondary | ICD-10-CM

## 2022-10-21 NOTE — Patient Instructions (Signed)
Referral placed. We will be in touch.  Continue your medications.  Follow up in 6 months

## 2022-10-26 ENCOUNTER — Other Ambulatory Visit: Payer: Self-pay | Admitting: *Deleted

## 2022-10-26 ENCOUNTER — Telehealth: Payer: Self-pay | Admitting: *Deleted

## 2022-10-26 DIAGNOSIS — Z794 Long term (current) use of insulin: Secondary | ICD-10-CM

## 2022-10-26 DIAGNOSIS — Z7984 Long term (current) use of oral hypoglycemic drugs: Secondary | ICD-10-CM

## 2022-10-26 DIAGNOSIS — E1165 Type 2 diabetes mellitus with hyperglycemia: Secondary | ICD-10-CM

## 2022-10-26 MED ORDER — DEXCOM G7 SENSOR MISC
1.0000 | 3 refills | Status: DC
Start: 2022-10-26 — End: 2022-12-30

## 2022-10-26 NOTE — Progress Notes (Signed)
Subjective:  Patient ID: Jeremy Patton, male    DOB: May 13, 1940  Age: 82 y.o. MRN: [id number]  CC: Follow up   HPI:  82 year old male with the below mentioned medical problems presents for follow-up.  Patient reports that he has had ongoing dizziness with abrupt head movements.  He states that this has been going on for years.  Improves at rest.  BP mildly elevated here today.  However, given advanced age this is acceptable.  He is on olmesartan and amlodipine.  Lipids at goal on Crestor.  Follows with Endo regarding type 2 diabetes.  Last A1c 7.8.  Patient Active Problem List   Diagnosis Date Noted   Benign prostatic hyperplasia with weak urinary stream 10/05/2022   Alzheimer's disease (HCC) 09/08/2022   Rash 09/08/2022   Mood disturbance 05/10/2022   Chronic low back pain without sciatica 07/01/2021   Carotid artery disease (HCC) 05/01/2015   Dizziness 05/01/2015   Essential hypertension 05/01/2015   Hyperlipidemia LDL goal <70 05/01/2015   Type 2 diabetes mellitus with circulatory disorder, with long-term current use of insulin (HCC) 05/01/2015   GERD (gastroesophageal reflux disease) 11/01/2014    Social Hx   Social History   Socioeconomic History   Marital status: Married    Spouse name: Not on file   Number of children: 4   Years of education: Not on file   Highest education level: Not on file  Occupational History   Occupation: retired  Tobacco Use   Smoking status: Former    Current packs/day: 0.00    Average packs/day: 2.0 packs/day for 30.0 years (60.0 ttl pk-yrs)    Types: Cigarettes    Start date: 12/16/1961    Quit date: 12/17/1991    Years since quitting: 30.8   Smokeless tobacco: Never  Vaping Use   Vaping status: Never Used  Substance and Sexual Activity   Alcohol use: Yes    Alcohol/week: 1.0 standard drink of alcohol    Types: 1 Standard drinks or equivalent per week   Drug use: Yes    Types: Marijuana   Sexual activity: Yes     Birth control/protection: None  Other Topics Concern   Not on file  Social History Narrative   Not on file   Social Determinants of Health   Financial Resource Strain: Low Risk  (12/10/2021)   Overall Financial Resource Strain (CARDIA)    Difficulty of Paying Living Expenses: Not hard at all  Food Insecurity: No Food Insecurity (12/10/2021)   Hunger Vital Sign    Worried About Running Out of Food in the Last Year: Never true    Ran Out of Food in the Last Year: Never true  Transportation Needs: No Transportation Needs (12/10/2021)   PRAPARE - Administrator, Civil Service (Medical): No    Lack of Transportation (Non-Medical): No  Physical Activity: Insufficiently Active (12/10/2021)   Exercise Vital Sign    Days of Exercise per Week: 3 days    Minutes of Exercise per Session: 30 min  Stress: No Stress Concern Present (12/10/2021)   [employer] of Occupational Health - Occupational Stress Questionnaire    Feeling of Stress : Not at all  Social Connections: Moderately Isolated (12/10/2021)   Social Connection and Isolation Panel [NHANES]    Frequency of Communication with Friends and Family: More than three times a week    Frequency of Social Gatherings with Friends and Family: Once a week    Attends Religious Services:  Never    Active Member of Clubs or Organizations: No    Attends Banker Meetings: Never    Marital Status: Married    Review of Systems Per HPI  Objective:  BP (!) 149/63   Pulse 80   Temp 97.7 F (36.5 C)   Ht 5\' 9"  (1.753 m)   Wt 140 lb 3.2 oz (63.6 kg)   SpO2 99%   BMI 20.70 kg/m      10/21/2022    2:46 PM 10/05/2022   11:25 AM 10/05/2022   11:20 AM  BP/Weight  Systolic BP 149 161 168  Diastolic BP 63 67 72  Wt. (Lbs) 140.2    BMI 20.7 kg/m2      Physical Exam Vitals reviewed.  Constitutional:      General: He is not in acute distress.    Appearance: Normal appearance.  HENT:     Head: Normocephalic and  atraumatic.  Cardiovascular:     Rate and Rhythm: Normal rate and regular rhythm.  Pulmonary:     Effort: Pulmonary effort is normal.     Breath sounds: Normal breath sounds. No wheezing or rales.  Neurological:     General: No focal deficit present.     Mental Status: He is alert.  Psychiatric:        Mood and Affect: Mood normal.        Behavior: Behavior normal.     Lab Results  Component Value Date   WBC 5.6 06/22/2021   HGB 13.0 06/22/2021   HCT 41.4 06/22/2021   PLT 145 (L) 06/22/2021   GLUCOSE 122 (H) 08/31/2022   CHOL 137 08/31/2022   TRIG 125 08/31/2022   HDL 73 08/31/2022   LDLCALC 43 08/31/2022   ALT 6 08/31/2022   AST 13 08/31/2022   NA 139 08/31/2022   K 4.8 08/31/2022   CL 103 08/31/2022   CREATININE 0.96 08/31/2022   BUN 19 08/31/2022   CO2 21 08/31/2022   TSH 1.400 08/31/2022   INR 0.98 06/08/2016   HGBA1C 7.8 (A) 09/01/2022   MICROALBUR 30 07/31/2021     Assessment & Plan:   Problem List Items Addressed This Visit       Cardiovascular and Mediastinum   Essential hypertension    Fair control.  Continue amlodipine and Benicar.      Type 2 diabetes mellitus with circulatory disorder, with long-term current use of insulin (HCC)    Continue current medications.  Continue follow-up with Endo.        Other   Dizziness - Primary    Given chronic nature, referring to vestibular rehab.      Relevant Orders   Ambulatory referral to Physical Therapy   Hyperlipidemia LDL goal <70    At goal on Crestor.  Continue.      Follow-up:  Return in about 6 months (around 04/22/2023).  Everlene Other DO [facility]

## 2022-10-26 NOTE — Telephone Encounter (Signed)
Patient left a message that he was in need of sensors being sent I for him. H has 2 more left, and he is not sure where he is to go to get them. He has a number but feels that it is incorrect? Looked in [place] but did not see anything?

## 2022-10-26 NOTE — Assessment & Plan Note (Signed)
At goal on Crestor.  Continue. 

## 2022-10-26 NOTE — Assessment & Plan Note (Signed)
Continue current medications.  Continue follow-up with Endo.

## 2022-10-26 NOTE — Telephone Encounter (Signed)
Talked with the patient. Have sent to [facility] for the [facility] G7 sensors. Patient was made aware.

## 2022-10-26 NOTE — Assessment & Plan Note (Signed)
Given chronic nature, referring to vestibular rehab.

## 2022-10-26 NOTE — Assessment & Plan Note (Signed)
Fair control.  Continue amlodipine and Benicar.

## 2022-10-26 NOTE — Telephone Encounter (Signed)
I can't seem to find where the original script was sent either, perhaps it was before I started using [employer] and we sent it manually to [facility] in the past.  Either way, [facility] will not be able to process his script due to his ins being [facility].  I recommend sending it to [facility] (which can be send electronically via Epic).  Can you confirm which CGM he is using?  I think it may have been for G7?

## 2022-11-03 ENCOUNTER — Encounter: Payer: Self-pay | Admitting: *Deleted

## 2022-11-03 ENCOUNTER — Telehealth: Payer: Self-pay | Admitting: *Deleted

## 2022-11-03 NOTE — Progress Notes (Signed)
  Care Coordination   Note   11/03/2022 Name: [name] MRN: [id number] DOB: July 27, 1940  Jeremy Patton is a 82 y.o. year old male who sees Tommie Sams, DO for primary care. I reached out to Buel Ream by phone today to offer care coordination services.  Mr. Proto was given information about Care Coordination services today including:   The Care Coordination services include support from the care team which includes your Nurse Coordinator, Clinical Social Worker, or Pharmacist.  The Care Coordination team is here to help remove barriers to the health concerns and goals most important to you. Care Coordination services are voluntary, and the patient may decline or stop services at any time by request to their care team member.   Care Coordination Consent Status: Patient agreed to services and verbal consent obtained.   Follow up plan:  Telephone appointment with care coordination team member scheduled for:  11/16/22  Encounter Outcome:  Patient Scheduled  [facility] Coordination Care Guide  Direct Dial: [phone]

## 2022-11-04 ENCOUNTER — Other Ambulatory Visit: Payer: Self-pay

## 2022-11-04 ENCOUNTER — Ambulatory Visit (HOSPITAL_COMMUNITY): Payer: Medicare PPO | Attending: Family Medicine

## 2022-11-04 DIAGNOSIS — R42 Dizziness and giddiness: Secondary | ICD-10-CM | POA: Insufficient documentation

## 2022-11-04 NOTE — Therapy (Signed)
OUTPATIENT PHYSICAL THERAPY VESTIBULAR EVALUATION     Patient Name: Jeremy Patton MRN: [id number] DOB:Nov 12, 1940, 82 y.o., male Today's Date: 11/04/2022  END OF SESSION:  PT End of Session - 11/04/22 1041     Visit Number 1    Number of Visits 16    Date for PT Re-Evaluation     Authorization Type [employer] (requested 12 visits)    Progress Note Due on Visit 8    PT Start Time 0950    PT Stop Time 1035    PT Time Calculation (min) 45 min    Equipment Utilized During Treatment Gait belt    Activity Tolerance Patient tolerated treatment well    Behavior During Therapy WFL for tasks assessed/performed             Past Medical History:  Diagnosis Date   Arthritis    Carotid artery disease (HCC)    a. s/p R CEA in 04/2015 b. 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   Diabetes mellitus without complication (HCC) 11/29/2011   GERD (gastroesophageal reflux disease)    Heart murmur    High cholesterol    History of CHF (congestive heart failure) 07/01/2021   Hyperlipidemia 11/29/2011   Hypertension 11/29/2011   Low back pain 11/29/2011   Lumbar radiculopathy 11/29/2011   Left   Pain of left lower leg    Wears glasses    Past Surgical History:  Procedure Laterality Date   CATARACT EXTRACTION W/ INTRAOCULAR LENS  IMPLANT, BILATERAL     CIRCUMCISION     Age 64   COLONOSCOPY  08/22/2005 and  07/31/2009   minimal internal hemorrhoids, left-sided diverticulosis   COLONOSCOPY N/A 11/25/2014   Procedure: COLONOSCOPY;  Surgeon: Corbin Ade, MD;  Location: [facility];  Service: Endoscopy;  Laterality: N/A;  1030   ENDARTERECTOMY Right 05/06/2015   Procedure: Right Carotid ENDARTERECTOMY ;  Surgeon: Fransisco Hertz, MD;  Location: [facility] OR;  Service: Vascular;  Laterality: Right;   ENDARTERECTOMY Left 06/25/2015   Procedure: LEFT CAROTID ENDARTERECTOMY;  Surgeon: Fransisco Hertz, MD;   Location: [facility] OR;  Service: Vascular;  Laterality: Left;   ESOPHAGEAL DILATION N/A 11/25/2014   Procedure: ESOPHAGEAL DILATION;  Surgeon: Corbin Ade, MD;  Location: [facility];  Service: Endoscopy;  Laterality: N/A;   ESOPHAGOGASTRODUODENOSCOPY N/A 11/25/2014   Procedure: ESOPHAGOGASTRODUODENOSCOPY (EGD);  Surgeon: Corbin Ade, MD;  Location: [facility];  Service: Endoscopy;  Laterality: N/A;   EYE SURGERY     MULTIPLE TOOTH EXTRACTIONS     PATCH ANGIOPLASTY Right 05/06/2015   Procedure: PATCH ANGIOPLASTY;  Surgeon: Fransisco Hertz, MD;  Location: [facility] OR;  Service: Vascular;  Laterality: Right;   PATCH ANGIOPLASTY Left 06/25/2015   Procedure: PATCH ANGIOPLASTY USING XENOSURE BIOLOGIC PATCH 1cm X 6cm ;  Surgeon: Fransisco Hertz, MD;  Location: [facility] OR;  Service: Vascular;  Laterality: Left;   TONSILLECTOMY     Patient Active Problem List   Diagnosis Date Noted   Benign prostatic hyperplasia with weak urinary stream 10/05/2022   Alzheimer's disease (HCC) 09/08/2022   Rash 09/08/2022   Mood disturbance 05/10/2022   Chronic low back pain without sciatica 07/01/2021   Carotid artery disease (HCC) 05/01/2015   Dizziness 05/01/2015   Essential hypertension 05/01/2015   Hyperlipidemia LDL goal <70 05/01/2015   Type 2 diabetes mellitus with circulatory disorder, with long-term current use  of insulin (HCC) 05/01/2015   GERD (gastroesophageal reflux disease) 11/01/2014    PCP: none on file  REFERRING PROVIDER: Tommie Sams, DO  REFERRING DIAG: R42 (ICD-10-CM) - Dizziness  THERAPY DIAG:  Dizziness and giddiness  ONSET DATE: For years  Rationale for Evaluation and Treatment: Rehabilitation  SUBJECTIVE:   SUBJECTIVE STATEMENT: Arrives to the clinic with c/o dizziness/lightheadedness when standing up and turning real fast. Patient states that he may tend to lose his balance when this happens. Patient states that he gets dizzy when he stares too long on moving objects while he's in a  moving vehicle. Denies any spinning sensation when laying on his back or sides. Patient states that he has been having this issue for years and started without reason. Denies trauma to the head. States that he has multiple sinus issues in the past. Patient had CT scan and MRI on the head and states that they did not see anything on it. Patient states that condition gradually got worse in time which prompted him to seek MD consultation. MD referred him to outpatient PT evaluation and management.   Pt accompanied by: self  PERTINENT HISTORY: DM, Alzheimer's, HTN, low back pain on occasion, sinus problems, HTN, CHF  PAIN:  Are you having pain? No  PRECAUTIONS: Fall  RED FLAGS: Bowel or bladder incontinence: No, Cauda equina syndrome: No, and Compression fracture: No   WEIGHT BEARING RESTRICTIONS: No  FALLS: Has patient fallen in last 6 months? No  LIVING ENVIRONMENT: Lives with: lives with their spouse Lives in: House/apartment Stairs: Yes: Internal: 13 steps; on left going up and External: 2 steps; on right going up Has following equipment at home: Single point cane and stair lift  PLOF: Independent and Independent with basic ADLs  PATIENT GOALS: "to clear this dizziness up"  OBJECTIVE:   DIAGNOSTIC FINDINGS: None performed recently  COGNITION: Overall cognitive status: Within functional limits for tasks assessed   POSTURE:  rounded shoulders and forward head  Cervical ROM:    Active A/PROM (deg) eval  Flexion WFL  Extension WFL  Right lateral flexion   Left lateral flexion   Right rotation WFL  Left rotation WFL  (Blank rows = not tested)  LOWER EXTREMITY MMT:   MMT Right eval Left eval  Hip flexion 4+ 4+  Hip abduction 4+ 4+  Hip adduction    Hip internal rotation    Hip external rotation    Knee flexion 4+ 4+  Knee extension 4+ 4+  Ankle dorsiflexion 4+ 4+  Ankle plantarflexion 4+ 4+  Ankle inversion    Ankle eversion    (Blank rows = not  tested)  MUSCLE LENGTH: mild restriction on B gastrocsoleus and hamstrings  BED MOBILITY:  Sit to supine Complete Independence Supine to sit Complete Independence Rolling to Right Complete Independence Rolling to Left Complete Independence  TRANSFERS: Assistive device utilized: None  Sit to stand: Complete Independence Stand to sit: Complete Independence  GAIT: Gait pattern: WFL Distance walked: 415 ft Assistive device utilized: Single point cane Level of assistance: Modified independence Comments: no gait deviations or unsteadiness seen  FUNCTIONAL TESTS:  5 times sit to stand: 10.81 2 minute walk test: 415 FT Dynamic Gait Index: 13  DGI 1. Gait level surface (2) Mild Impairment: Walks 20', uses assistive devices, slower speed, mild gait deviations. 2. Change in gait speed (2) Mild Impairment: Is able to change speed but demonstrates mild gait deviations, or not gait deviations but unable to achieve a significant change  in velocity, or uses an assistive device. 3. Gait with horizontal head turns (1) Moderate Impairment: Performs head turns with moderate change in gait velocity, slows down, staggers but recovers, can continue to walk. 4. Gait with vertical head turns 1) Moderate Impairment: Performs head turns with moderate change in gait velocity, slows down, staggers but recovers, can continue to walk. 5. Gait and pivot turn (2) Mild Impairment: Pivot turns safely in > 3 seconds and stops with no loss of balance. 6. Step over obstacle (1) Moderate Impairment: Is able to step over box but must stop, then step over. May require verbal cueing. 7. Step around obstacles (2) Mild Impairment: Is able to step around both cones, but must slow down and adjust steps to clear cones. 8. Stairs (2) Mild Impairment: Alternating feet, must use rail.  TOTAL SCORE: 13 / 24  PATIENT SURVEYS:  FOTO 45.07  VESTIBULAR ASSESSMENT:  GENERAL OBSERVATION: WFL   SYMPTOM  BEHAVIOR:  Subjective history: dizziness/lightheadedness  Non-Vestibular symptoms:  none  Type of dizziness: Unsteady with head/body turns and Lightheadedness/Faint  Duration: short (around a few minutes)  Aggravating factors: Induced by motion: turning head quickly and getting up from standing and Worse outside or in busy environment  Relieving factors: rest  Progression of symptoms: worse  OCULOMOTOR EXAM:  Ocular Alignment: normal  Ocular ROM: No Limitations  Spontaneous Nystagmus: absent  Gaze-Induced Nystagmus: absent  Smooth Pursuits: intact  Saccades: hypometric/undershoots  Oculomotor reflex: slightly impaired, reports of dizziness  ACTIVE VBI: negative on B  VESTIBULAR - OCULAR REFLEX:   Slow VOR: Positive Bilaterally  VOR Cancellation: Corrective Saccades  Head-Impulse Test: HIT Right: positive HIT Left: positive   POSITIONAL TESTING: Right Dix-Hallpike: no nystagmus Left Dix-Hallpike: no nystagmus Right Roll Test: no nystagmus Left Roll Test: no nystagmus  MOTION SENSITIVITY:  Motion Sensitivity Quotient Intensity: 0 = none, 1 = Lightheaded, 2 = Mild, 3 = Moderate, 4 = Severe, 5 = Vomiting  Intensity  1. Sitting to supine 0  2. Supine to L side   3. Supine to R side   4. Supine to sitting 1  5. L Hallpike-Dix 0  6. Up from L    7. R Hallpike-Dix 0  8. Up from R    9. Sitting, head tipped to L knee   10. Head up from L knee   11. Sitting, head tipped to R knee   12. Head up from R knee   13. Sitting head turns x5   14.Sitting head nods x5   15. In stance, 180 turn to L  1  16. In stance, 180 turn to R 1    OTHOSTATICS: Supine: 146/66 with HR 82 Standing: 122/46 with HR 89  VESTIBULAR TREATMENT:                                                                                                   DATE:  11/04/22 Evaluation and patient education done  PATIENT EDUCATION: Education details: Educated on the pathoanatomy of dizziness (vestibular  hypofunction) and orthostatic hypotension. Educated on the goals and course of rehab.  Educated on measure to reduce falls at home. Person educated: Patient Education method: Explanation Education comprehension: verbalized understanding  HOME EXERCISE PROGRAM:  None provided to date.  GOALS: Goals reviewed with patient? Yes  SHORT TERM GOALS: Target date: 12/02/22  Pt will demonstrate indep in HEP to facilitate carry-over of skilled services and improve functional outcomes Goal status: INITIAL  LONG TERM GOALS: Target date:   Pt will increase FOTO to at least 57 in order to demonstrate significant improvement in function related to ambulation and ADLs Baseline: 45.8 Goal status: INITIAL  2.  Pt will improve DGI by at least 3 points in order to demonstrate clinically significant improvement in balance and decreased risk for falls  Baseline: 13 Goal status: INITIAL  3.  Pt will increase by at least 40 ft in order to demonstrate clinically significant improvement in community ambulation Baseline: 415 ft Goal status: INITIAL  4.  Pt will demonstrate a motion sensitivity quotient of 0 in supine to sit and when turning head on R/L when standing to facilitate ease and safety in ADLs and ambulation. Baseline: 1 Goal status: INITIAL  ASSESSMENT:  CLINICAL IMPRESSION: Patient is a 82 y.o. male who was seen today for physical therapy evaluation and treatment for dizziness. Patient was diagnosed with dizziness by referring provider further defined by difficulty with standing up and doing quick turns due to impaired impaired balance/proprioception, vestibular hypofunction and  decreased activity tolerance. Skilled PT is required to address the impairments and functional limitations listed below.    OBJECTIVE IMPAIRMENTS: decreased activity tolerance, decreased balance, difficulty walking, and dizziness.   ACTIVITY LIMITATIONS: carrying, bending, sitting, standing, transfers,  bathing, and hygiene/grooming  PARTICIPATION LIMITATIONS: meal prep, cleaning, laundry, driving, shopping, community activity, and yard work  PERSONAL FACTORS: Time since onset of injury/illness/exacerbation are also affecting patient's functional outcome.   REHAB POTENTIAL: Good  CLINICAL DECISION MAKING: Stable/uncomplicated  EVALUATION COMPLEXITY: Low   PLAN:  PT FREQUENCY: 2x/week  PT DURATION: 8 weeks  PLANNED INTERVENTIONS: Therapeutic exercises, Therapeutic activity, Neuromuscular re-education, Balance training, Gait training, Patient/Family education, Self Care, Vestibular training, and Manual therapy  PLAN FOR NEXT SESSION: Provide HEP. Begin adaptation/substitution/habituation activities. Incorporate cardiovascular endurance activities.   Tish Frederickson. Edwyna Dangerfield, PT, DPT, OCS Board-Certified Clinical Specialist in Orthopedic PT PT Compact Privilege # ([place]): [id number] T 11/04/2022, 10:43 AM

## 2022-11-09 ENCOUNTER — Encounter (HOSPITAL_COMMUNITY): Payer: Medicare PPO

## 2022-11-11 ENCOUNTER — Encounter (HOSPITAL_COMMUNITY): Payer: Medicare PPO

## 2022-11-15 ENCOUNTER — Encounter (HOSPITAL_COMMUNITY): Payer: Medicare PPO

## 2022-11-16 ENCOUNTER — Ambulatory Visit: Payer: Self-pay | Admitting: *Deleted

## 2022-11-16 DIAGNOSIS — E1165 Type 2 diabetes mellitus with hyperglycemia: Secondary | ICD-10-CM | POA: Diagnosis not present

## 2022-11-16 NOTE — Patient Outreach (Signed)
Care Coordination   Initial Visit Note   12/06/2022 for 11/16/22 Name: Jeremy Patton MRN: [id number] DOB: 09/08/1940  Jeremy Patton is a 82 y.o. year old male who sees Tommie Sams, DO for primary care. I spoke with  Buel Ream by phone today.  What matters to the patients health and wellness today?  Vertigo another plan vs [facility] PT Diabetes goal to be 7 and below now at 7.8     Goals Addressed             This Visit's Progress    manage vertigo at home -care coordination   Not on track    Interventions Today    Flowsheet Row Most Recent Value  Chronic Disease   Chronic disease during today's visit Other, Diabetes  [vertigo]  General Interventions   General Interventions Discussed/Reviewed General Interventions Reviewed, Labs, [facility], Doctor Visits  Doctor Visits Discussed/Reviewed Doctor Visits Reviewed, PCP, Airline pilot therapy]  PCP/Specialist Visits Compliance with follow-up visit  Exercise Interventions   Exercise Discussed/Reviewed Exercise Reviewed  Physical Activity Discussed/Reviewed Physical Activity Reviewed, Home Exercise Program (HEP)  Education Interventions   Education Provided Provided Education  Provided Verbal Education On [employer] with him his concern with not being able to afford the co pays for the ordered vestibular therapy. Permission given to check to see if  PREP may decrease his cost. Discussed the management of his diabetes. His HgA1c goal is 7 &he is at 7.8]              SDOH assessments and interventions completed:  Yes     Care Coordination Interventions:  Yes, provided   Follow up plan: Follow up call scheduled for 11/30/22    Encounter Outcome:  Patient Visit Completed   Cala Bradford L. Noelle Penner, RN, BSN, CCM, Care Management Coordinator [phone]

## 2022-11-18 ENCOUNTER — Encounter (HOSPITAL_COMMUNITY): Payer: Medicare PPO | Admitting: Physical Therapy

## 2022-11-22 ENCOUNTER — Other Ambulatory Visit: Payer: Self-pay | Admitting: Nurse Practitioner

## 2022-11-23 ENCOUNTER — Encounter (HOSPITAL_COMMUNITY): Payer: Medicare PPO

## 2022-11-25 ENCOUNTER — Encounter (HOSPITAL_COMMUNITY): Payer: Medicare PPO

## 2022-11-28 ENCOUNTER — Ambulatory Visit
Admission: EM | Admit: 2022-11-28 | Discharge: 2022-11-28 | Disposition: A | Discharge status: Home / Self Care | Payer: Medicare PPO | Attending: Family Medicine | Admitting: Family Medicine

## 2022-11-28 DIAGNOSIS — L723 Sebaceous cyst: Secondary | ICD-10-CM | POA: Diagnosis not present

## 2022-11-28 DIAGNOSIS — L089 Local infection of the skin and subcutaneous tissue, unspecified: Secondary | ICD-10-CM | POA: Diagnosis not present

## 2022-11-28 MED ORDER — MUPIROCIN 2 % EX OINT: 1.0000 | TOPICAL_OINTMENT | Freq: Two times a day (BID) | CUTANEOUS | 0 refills | Status: DC

## 2022-11-28 MED ORDER — CEPHALEXIN 500 MG PO CAPS: 500.0000 mg | ORAL_CAPSULE | Freq: Two times a day (BID) | ORAL | 0 refills | Status: DC

## 2022-11-28 MED ORDER — CHLORHEXIDINE GLUCONATE 4 % EX SOLN: Freq: Every day | CUTANEOUS | 0 refills | Status: DC | PRN

## 2022-11-28 NOTE — Discharge Instructions (Addendum)
The area at least once a day with the Hibiclens solution, apply the mupirocin ointment and a nonstick dressing until fully healed.  Take the full course of Keflex and follow-up with your primary care provider in about a week to recheck the area

## 2022-11-28 NOTE — ED Provider Notes (Signed)
[facility]    CSN: [id number] Arrival date & time: 11/28/22  9562      History   Chief Complaint Chief Complaint  Patient presents with   Abscess    HPI Jeremy Patton is a 82 y.o. male.   Patient presenting today with 3-day history of a painful draining abscess to the mid back.  He states he has had a cyst in this area for quite some time and it has become infected like this once before.  The area burst at this morning and had significant drainage.  He denies fever, chills, injury to the area, sweats, body aches.  Not tried anything for symptoms thus far.    Past Medical History:  Diagnosis Date   Arthritis    Carotid artery disease (HCC)    a. s/p R CEA in 04/2015 b. 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   Diabetes mellitus without complication (HCC) 11/29/2011   GERD (gastroesophageal reflux disease)    Heart murmur    High cholesterol    History of CHF (congestive heart failure) 07/01/2021   Hyperlipidemia 11/29/2011   Hypertension 11/29/2011   Low back pain 11/29/2011   Lumbar radiculopathy 11/29/2011   Left   Pain of left lower leg    Wears glasses     Patient Active Problem List   Diagnosis Date Noted   Benign prostatic hyperplasia with weak urinary stream 10/05/2022   Alzheimer's disease (HCC) 09/08/2022   Rash 09/08/2022   Mood disturbance 05/10/2022   Chronic low back pain without sciatica 07/01/2021   Carotid artery disease (HCC) 05/01/2015   Dizziness 05/01/2015   Essential hypertension 05/01/2015   Hyperlipidemia LDL goal <70 05/01/2015   Type 2 diabetes mellitus with circulatory disorder, with long-term current use of insulin (HCC) 05/01/2015   GERD (gastroesophageal reflux disease) 11/01/2014    Past Surgical History:  Procedure Laterality Date   CATARACT EXTRACTION W/ INTRAOCULAR LENS  IMPLANT, BILATERAL     CIRCUMCISION     Age 28    COLONOSCOPY  08/22/2005 and  07/31/2009   minimal internal hemorrhoids, left-sided diverticulosis   COLONOSCOPY N/A 11/25/2014   Procedure: COLONOSCOPY;  Surgeon: Corbin Ade, MD;  [facility];  Service: Endoscopy;  Laterality: N/A;  1030   ENDARTERECTOMY Right 05/06/2015   Procedure: Right Carotid ENDARTERECTOMY ;  Surgeon: Fransisco Hertz, MD;  Location: [facility] OR;  Service: Vascular;  Laterality: Right;   ENDARTERECTOMY Left 06/25/2015   Procedure: LEFT CAROTID ENDARTERECTOMY;  Surgeon: Fransisco Hertz, MD;  Location: [facility] OR;  Service: Vascular;  Laterality: Left;   ESOPHAGEAL DILATION N/A 11/25/2014   Procedure: ESOPHAGEAL DILATION;  Surgeon: Corbin Ade, MD;  Location: [facility];  Service: Endoscopy;  Laterality: N/A;   ESOPHAGOGASTRODUODENOSCOPY N/A 11/25/2014   Procedure: ESOPHAGOGASTRODUODENOSCOPY (EGD);  Surgeon: Corbin Ade, MD;  Location: [facility];  Service: Endoscopy;  Laterality: N/A;   EYE SURGERY     MULTIPLE TOOTH EXTRACTIONS     PATCH ANGIOPLASTY Right 05/06/2015   Procedure: PATCH ANGIOPLASTY;  Surgeon: Fransisco Hertz, MD;  Location: [facility] OR;  Service: Vascular;  Laterality: Right;   PATCH ANGIOPLASTY Left 06/25/2015   Procedure: PATCH ANGIOPLASTY USING XENOSURE BIOLOGIC PATCH 1cm X 6cm ;  Surgeon: Fransisco Hertz, MD;  Location: [facility] OR;  Service: Vascular;  Laterality: Left;   TONSILLECTOMY  Home Medications    Prior to Admission medications   Medication Sig Start Date End Date Taking? Authorizing Provider  cephALEXin (KEFLEX) 500 MG capsule Take 1 capsule (500 mg total) by mouth 2 (two) times daily. 11/28/22  Yes Particia Nearing, PA-C  chlorhexidine (HIBICLENS) 4 % external liquid Apply topically daily as needed. 11/28/22  Yes Particia Nearing, PA-C  mupirocin ointment (BACTROBAN) 2 % Apply 1 Application topically 2 (two) times daily. 11/28/22  Yes Particia Nearing, PA-C  ACCU-CHEK GUIDE test strip  08/18/21   [provider]  Accu-Chek  Softclix Lancets lancets  08/18/21   [provider]  acetaminophen (TYLENOL) 500 MG tablet Take 1,000 mg by mouth 2 (two) times daily.    [provider]  amLODipine (NORVASC) 10 MG tablet Take 1 tablet (10 mg total) by mouth daily. 05/10/22   Tommie Sams, DO  aspirin 81 MG tablet Take 81 mg by mouth daily.    [provider]  blood glucose meter kit and supplies KIT Dispense based on patient and insurance preference. Use up to four times daily as directed. 07/16/21   Tommie Sams, DO  clobetasol ointment (TEMOVATE) 0.05 % Apply 1 Application topically 2 (two) times daily. 09/07/22   Tommie Sams, DO  Continuous Glucose Sensor (DEXCOM G7 SENSOR) MISC Inject 1 Application into the skin as directed. Change sensor every 10 days as directed. 10/26/22   Dani Gobble, NP  LANTUS SOLOSTAR 100 UNIT/ML Solostar Pen INJECT 10 UNITS INTO THE SKIN AT BEDTIME. 11/23/22   Dani Gobble, NP  metFORMIN (GLUCOPHAGE) 1000 MG tablet Take 1 tablet (1,000 mg total) by mouth 2 (two) times daily with a meal. 04/22/22   Dani Gobble, NP  olmesartan (BENICAR) 40 MG tablet Take 1 tablet (40 mg total) by mouth daily. 09/07/22   Tommie Sams, DO  pantoprazole (PROTONIX) 40 MG tablet TAKE (1) TABLET BY MOUTH ONCE DAILY. 05/31/22   Tommie Sams, DO  potassium chloride SA (KLOR-CON M) 20 MEQ tablet TAKE ONE TABLET BY MOUTH DAILY. 10/20/22   Tommie Sams, DO  rosuvastatin (CRESTOR) 20 MG tablet TAKE ONE TABLET BY MOUTH ONCE DAILY. 10/20/22   Tommie Sams, DO  sertraline (ZOLOFT) 50 MG tablet Take 1 tablet (50 mg total) by mouth daily. 05/10/22   Tommie Sams, DO  sitaGLIPtin (JANUVIA) 50 MG tablet Take 1 tablet (50 mg total) by mouth daily. 04/22/22   Dani Gobble, NP  tamsulosin (FLOMAX) 0.4 MG CAPS capsule TAKE (1) CAPSULE BY MOUTH ONCE DAILY. 10/05/22   Donnita Falls, FNP    Family History Family History  Problem Relation Age of Onset   Stroke Mother    Heart disease Mother         before age 69   Colon cancer Brother        Age 71    Social History Social History   Tobacco Use   Smoking status: Former    Current packs/day: 0.00    Average packs/day: 2.0 packs/day for 30.0 years (60.0 ttl pk-yrs)    Types: Cigarettes    Start date: 12/16/1961    Quit date: 12/17/1991    Years since quitting: 30.9   Smokeless tobacco: Never  Vaping Use   Vaping status: Never Used  Substance Use Topics   Alcohol use: Yes    Alcohol/week: 1.0 standard drink of alcohol    Types: 1 Standard drinks or equivalent per week  Drug use: Yes    Types: Marijuana     Allergies   Protamine   Review of Systems Review of Systems Per HPI  Physical Exam Triage Vital Signs ED Triage Vitals  Encounter Vitals Group     BP 11/28/22 0930 (!) 167/95     Systolic BP Percentile --      Diastolic BP Percentile --      Pulse Rate 11/28/22 0930 98     Resp 11/28/22 0931 16     Temp 11/28/22 0930 97.7 F (36.5 C)     Temp Source 11/28/22 0930 Oral     SpO2 11/28/22 0931 94 %     Weight --      Height --      Head Circumference --      Peak Flow --      Pain Score 11/28/22 0932 0     Pain Loc --      Pain Education --      Exclude from Growth Chart --    No data found.  Updated Vital Signs BP (!) 167/95 (BP Location: Right Arm)   Pulse 98   Temp 97.7 F (36.5 C) (Oral)   Resp 16   SpO2 94%   Visual Acuity Right Eye Distance:   Left Eye Distance:   Bilateral Distance:    Right Eye Near:   Left Eye Near:    Bilateral Near:     Physical Exam Vitals and nursing note reviewed.  Constitutional:      Appearance: Normal appearance.  HENT:     Head: Atraumatic.  Eyes:     Extraocular Movements: Extraocular movements intact.     Conjunctiva/sclera: Conjunctivae normal.  Cardiovascular:     Rate and Rhythm: Normal rate and regular rhythm.  Pulmonary:     Effort: Pulmonary effort is normal.     Breath sounds: Normal breath sounds.  Musculoskeletal:         General: Normal range of motion.     Cervical back: Normal range of motion and neck supple.  Skin:    General: Skin is warm.     Comments: 1.5 cm erythematous cyst with dried drainage to the mid back  Neurological:     General: No focal deficit present.     Mental Status: He is oriented to person, place, and time.  Psychiatric:        Mood and Affect: Mood normal.        Thought Content: Thought content normal.        Judgment: Judgment normal.      UC Treatments / Results  Labs (all labs ordered are listed, but only abnormal results are displayed) Labs Reviewed - No data to display  EKG   Radiology No results found.  Procedures Procedures (including critical care time)  Medications Ordered in UC Medications - No data to display  Initial Impression / Assessment and Plan / UC Course  I have reviewed the triage vital signs and the nursing notes.  Pertinent labs & imaging results that were available during my care of the patient were reviewed by me and considered in my medical decision making (see chart for details).     Infected sebaceous cyst, has spontaneously drained some this morning and drained a copious amount with pressure applied during exam, emptying entire contents without need for full I&D procedure.  Patient tolerated this well, will place on course of antibiotics and topical wound care and follow-up with PCP for consideration  of cyst excision procedure as this is a recurrent issue for him.  Final Clinical Impressions(s) / UC Diagnoses   Final diagnoses:  Infected sebaceous cyst     Discharge Instructions      The area at least once a day with the Hibiclens solution, apply the mupirocin ointment and a nonstick dressing until fully healed.  Take the full course of Keflex and follow-up with your primary care provider in about a week to recheck the area    ED Prescriptions     Medication Sig Dispense Auth. Provider   cephALEXin (KEFLEX) 500 MG capsule  Take 1 capsule (500 mg total) by mouth 2 (two) times daily. 14 capsule Particia Nearing, [place]   chlorhexidine (HIBICLENS) 4 % external liquid Apply topically daily as needed. 236 mL Particia Nearing, PA-C   mupirocin ointment (BACTROBAN) 2 % Apply 1 Application topically 2 (two) times daily. 22 g Particia Nearing, [place]      PDMP not reviewed this encounter.   Particia Nearing, [place] 11/28/22 1049

## 2022-11-28 NOTE — ED Notes (Signed)
Cleaned abscess area with hibiclens. Applied mupiricin ointment and dress area with a non adherent and gauze tape.

## 2022-11-28 NOTE — ED Triage Notes (Signed)
Pt reports he has an abscess on his lower back x 3 days that burst this morning.

## 2022-11-30 ENCOUNTER — Ambulatory Visit: Payer: Self-pay | Admitting: *Deleted

## 2022-11-30 ENCOUNTER — Encounter (HOSPITAL_COMMUNITY): Payer: Medicare PPO

## 2022-12-01 ENCOUNTER — Ambulatory Visit: Payer: Self-pay | Admitting: *Deleted

## 2022-12-01 NOTE — Patient Outreach (Addendum)
Care Coordination   Follow Up Visit Note   12/01/2022 Name: Jeremy Patton MRN: [id number] DOB: [birth year]  Jeremy Patton is a 82 y.o. year old male who sees Tommie Sams, DO for primary care. I spoke with  Buel Ream by phone today.  What matters to the patients health and wellness today?  Options for vestibular therapy    Goals Addressed             This Visit's Progress    manage vertigo at home -care coordination       Interventions Today    Flowsheet Row Most Recent Value  Chronic Disease   Chronic disease during today's visit Other  General Interventions   General Interventions Discussed/Reviewed General Interventions Reviewed, [facility], Doctor Visits, Communication with  Doctor Visits Discussed/Reviewed Doctor Visits Discussed, PCP, Specialist  PCP/Specialist Visits Compliance with follow-up visit  Communication with PCP/Specialists, RN  [spoke with Dr Adriana Simas 11/30/22 in the MD office and collaborated with [facility] PREP staff Bev. Dr Adriana Simas informed of the copay for the out patient rehab vertigo sessions Mr Curtner is not able to afford, Dr Adriana Simas gave permission to try to find optioins]  Exercise Interventions   Exercise Discussed/Reviewed Exercise Reviewed, Physical Activity  Physical Activity Discussed/Reviewed Physical Activity Discussed, Physical Activity Reviewed, PREP  [bev at the [facility] confirmed the [facility] PREP program does not have any vertigo exercises the patient could benefit from]  Education Interventions   Education Provided Provided Education              SDOH assessments and interventions completed:  No     Care Coordination Interventions:  Yes, provided   Follow up plan: Follow up call scheduled for 01/04/23    Encounter Outcome:  Patient Visit Completed   Cala Bradford L. Noelle Penner, RN, BSN, [facility]  VBCI Care Management Coordinator  [phone]  Fax: [phone]

## 2022-12-01 NOTE — Patient Instructions (Addendum)
Visit Information  Thank you for taking time to visit with me today. Please don't hesitate to contact me if I can be of assistance to you.   Following are the goals we discussed today:   Goals Addressed             This Visit's Progress    manage vertigo at home -care coordination       Interventions Today    Flowsheet Row Most Recent Value  Chronic Disease   Chronic disease during today's visit Other  General Interventions   General Interventions Discussed/Reviewed General Interventions Reviewed, [facility], Doctor Visits, Communication with  Doctor Visits Discussed/Reviewed Doctor Visits Discussed, PCP, Specialist  PCP/Specialist Visits Compliance with follow-up visit  Communication with PCP/Specialists, RN  [spoke with Dr Adriana Simas 11/30/22 in the MD office and collaborated with [facility] PREP staff Bev. Dr Adriana Simas informed of the copay for the out patient rehab vertigo sessions [name] is not able to afford, Dr Adriana Simas gave permission to try to find optioins]  Exercise Interventions   Exercise Discussed/Reviewed Exercise Reviewed, Physical Activity  Physical Activity Discussed/Reviewed Physical Activity Discussed, Physical Activity Reviewed, PREP  Meredith Pel at the [facility] confirmed the [facility] PREP program does not have any vertigo exercises the patient could benefit from]  Education Interventions   Education Provided Provided Education              Our next appointment is by telephone on 01/07/23 at 1115  Please call the care guide team at [phone] if you need to cancel or reschedule your appointment.   If you are experiencing a Mental Health or Behavioral Health Crisis or need someone to talk to, please call the Suicide and Crisis Lifeline: 988 call the [place] National Suicide Prevention Lifeline: (334) 799-4570 or TTY: 850-823-2277 TTY 279-528-9501) to talk to a trained counselor call 1-800-273-TALK (toll free, 24 hour hotline) call the [facility]: (413) 078-6629 call 911   Patient verbalizes understanding of instructions and care plan provided today and agrees to view in MyChart. Active MyChart status and patient understanding of how to access instructions and care plan via MyChart confirmed with patient.     The patient has been provided with contact information for the care management team and has been advised to call with any health related questions or concerns.   Jeremy Grisby L. Noelle Penner, RN, BSN, [facility]  VBCI Care Management Coordinator  [phone]  Fax: [phone]

## 2022-12-01 NOTE — Patient Outreach (Signed)
Care Coordination   12/01/2022 Name: Jeremy Patton MRN: [id number] DOB: 1940-05-05   Care Coordination Outreach Attempts:  An unsuccessful telephone outreach was attempted for a scheduled appointment today.  Follow Up Plan:  Additional outreach attempts will be made to offer the patient care coordination information and services.   Encounter Outcome:  Patient Request to Call Back Left a message with his wife He was outside doing lawn care    Care Coordination Interventions:  Yes, provided    Tonita Bills L. Noelle Penner, RN, BSN, [facility]  VBCI Care Management Coordinator  [phone]  Fax: [phone]

## 2022-12-02 ENCOUNTER — Encounter (HOSPITAL_COMMUNITY): Payer: Medicare PPO

## 2022-12-06 NOTE — Patient Instructions (Signed)
Visit Information  Thank you for taking time to visit with me today. Please don't hesitate to contact me if I can be of assistance to you.   Following are the goals we discussed today:   Goals Addressed             This Visit's Progress    manage vertigo at home -care coordination   Not on track    Interventions Today    Flowsheet Row Most Recent Value  Chronic Disease   Chronic disease during today's visit Other, Diabetes  [vertigo]  General Interventions   General Interventions Discussed/Reviewed General Interventions Reviewed, Labs, [facility], Doctor Visits  Doctor Visits Discussed/Reviewed Doctor Visits Reviewed, PCP, Airline pilot therapy]  PCP/Specialist Visits Compliance with follow-up visit  Exercise Interventions   Exercise Discussed/Reviewed Exercise Reviewed  Physical Activity Discussed/Reviewed Physical Activity Reviewed, Home Exercise Program (HEP)  Education Interventions   Education Provided Provided Education  Provided Verbal Education On [employer] with him his concern with not being able to afford the co pays for the ordered vestibular therapy. Permission given to check to see if  PREP may decrease his cost. Discussed the management of his diabetes. His HgA1c goal is 7 &he is at 7.8]              Our next appointment is by telephone on 11/30/22 at 0900  Please call the care guide team at [phone] if you need to cancel or reschedule your appointment.   If you are experiencing a Mental Health or Behavioral Health Crisis or need someone to talk to, please call the Suicide and Crisis Lifeline: 988 call the [place] National Suicide Prevention Lifeline: 518-127-3246 or TTY: (425)238-2747 TTY 248 471 1843) to talk to a trained counselor call 1-800-273-TALK (toll free, 24 hour hotline) call the [facility]: 785-338-6325 call 911   No computer access, no preference for copy of AVS     The patient  has been provided with contact information for the care management team and has been advised to call with any health related questions or concerns.   Amayah Staheli L. Noelle Penner, RN, BSN, [facility]  VBCI Care Management Coordinator  [phone]  Fax: [phone]

## 2022-12-07 ENCOUNTER — Encounter (HOSPITAL_COMMUNITY): Payer: Medicare PPO

## 2022-12-09 ENCOUNTER — Encounter (HOSPITAL_COMMUNITY): Payer: Medicare PPO

## 2022-12-09 ENCOUNTER — Other Ambulatory Visit: Payer: Self-pay | Admitting: Family Medicine

## 2022-12-10 DIAGNOSIS — H40052 Ocular hypertension, left eye: Secondary | ICD-10-CM | POA: Diagnosis not present

## 2022-12-20 DIAGNOSIS — E1165 Type 2 diabetes mellitus with hyperglycemia: Secondary | ICD-10-CM | POA: Diagnosis not present

## 2022-12-24 ENCOUNTER — Encounter (HOSPITAL_COMMUNITY): Payer: Self-pay | Admitting: Cardiology

## 2022-12-24 ENCOUNTER — Other Ambulatory Visit: Payer: Self-pay

## 2022-12-24 ENCOUNTER — Inpatient Hospital Stay (HOSPITAL_COMMUNITY): Payer: Medicare PPO

## 2022-12-24 ENCOUNTER — Encounter (HOSPITAL_COMMUNITY): Admission: EM | Disposition: E | Payer: Self-pay | Source: Home / Self Care | Attending: Cardiology

## 2022-12-24 ENCOUNTER — Other Ambulatory Visit (HOSPITAL_COMMUNITY): Payer: Self-pay

## 2022-12-24 ENCOUNTER — Inpatient Hospital Stay (HOSPITAL_COMMUNITY)
Admission: EM | Admit: 2022-12-24 | Discharge: 2023-01-23 | DRG: 215 | Disposition: E | Payer: Medicare PPO | Attending: Cardiology | Admitting: Cardiology

## 2022-12-24 DIAGNOSIS — E1122 Type 2 diabetes mellitus with diabetic chronic kidney disease: Secondary | ICD-10-CM | POA: Diagnosis present

## 2022-12-24 DIAGNOSIS — Y840 Cardiac catheterization as the cause of abnormal reaction of the patient, or of later complication, without mention of misadventure at the time of the procedure: Secondary | ICD-10-CM | POA: Diagnosis not present

## 2022-12-24 DIAGNOSIS — N400 Enlarged prostate without lower urinary tract symptoms: Secondary | ICD-10-CM | POA: Diagnosis not present

## 2022-12-24 DIAGNOSIS — Z7984 Long term (current) use of oral hypoglycemic drugs: Secondary | ICD-10-CM

## 2022-12-24 DIAGNOSIS — E1165 Type 2 diabetes mellitus with hyperglycemia: Secondary | ICD-10-CM | POA: Diagnosis not present

## 2022-12-24 DIAGNOSIS — Z452 Encounter for adjustment and management of vascular access device: Secondary | ICD-10-CM | POA: Diagnosis not present

## 2022-12-24 DIAGNOSIS — M1990 Unspecified osteoarthritis, unspecified site: Secondary | ICD-10-CM | POA: Diagnosis present

## 2022-12-24 DIAGNOSIS — K219 Gastro-esophageal reflux disease without esophagitis: Secondary | ICD-10-CM | POA: Diagnosis present

## 2022-12-24 DIAGNOSIS — E872 Acidosis, unspecified: Secondary | ICD-10-CM | POA: Diagnosis present

## 2022-12-24 DIAGNOSIS — Z794 Long term (current) use of insulin: Secondary | ICD-10-CM | POA: Diagnosis not present

## 2022-12-24 DIAGNOSIS — R079 Chest pain, unspecified: Secondary | ICD-10-CM | POA: Diagnosis not present

## 2022-12-24 DIAGNOSIS — I130 Hypertensive heart and chronic kidney disease with heart failure and stage 1 through stage 4 chronic kidney disease, or unspecified chronic kidney disease: Secondary | ICD-10-CM | POA: Diagnosis present

## 2022-12-24 DIAGNOSIS — D696 Thrombocytopenia, unspecified: Secondary | ICD-10-CM | POA: Diagnosis not present

## 2022-12-24 DIAGNOSIS — E43 Unspecified severe protein-calorie malnutrition: Secondary | ICD-10-CM | POA: Insufficient documentation

## 2022-12-24 DIAGNOSIS — I9589 Other hypotension: Secondary | ICD-10-CM | POA: Diagnosis not present

## 2022-12-24 DIAGNOSIS — Z515 Encounter for palliative care: Secondary | ICD-10-CM

## 2022-12-24 DIAGNOSIS — J811 Chronic pulmonary edema: Secondary | ICD-10-CM | POA: Diagnosis not present

## 2022-12-24 DIAGNOSIS — Z79899 Other long term (current) drug therapy: Secondary | ICD-10-CM

## 2022-12-24 DIAGNOSIS — I447 Left bundle-branch block, unspecified: Secondary | ICD-10-CM | POA: Diagnosis present

## 2022-12-24 DIAGNOSIS — G4700 Insomnia, unspecified: Secondary | ICD-10-CM | POA: Diagnosis present

## 2022-12-24 DIAGNOSIS — Z8249 Family history of ischemic heart disease and other diseases of the circulatory system: Secondary | ICD-10-CM

## 2022-12-24 DIAGNOSIS — R200 Anesthesia of skin: Secondary | ICD-10-CM | POA: Diagnosis not present

## 2022-12-24 DIAGNOSIS — N182 Chronic kidney disease, stage 2 (mild): Secondary | ICD-10-CM | POA: Diagnosis present

## 2022-12-24 DIAGNOSIS — R739 Hyperglycemia, unspecified: Secondary | ICD-10-CM | POA: Diagnosis not present

## 2022-12-24 DIAGNOSIS — D62 Acute posthemorrhagic anemia: Secondary | ICD-10-CM | POA: Diagnosis not present

## 2022-12-24 DIAGNOSIS — N179 Acute kidney failure, unspecified: Secondary | ICD-10-CM | POA: Diagnosis present

## 2022-12-24 DIAGNOSIS — I213 ST elevation (STEMI) myocardial infarction of unspecified site: Secondary | ICD-10-CM | POA: Diagnosis not present

## 2022-12-24 DIAGNOSIS — Z87891 Personal history of nicotine dependence: Secondary | ICD-10-CM | POA: Diagnosis not present

## 2022-12-24 DIAGNOSIS — R918 Other nonspecific abnormal finding of lung field: Secondary | ICD-10-CM | POA: Diagnosis not present

## 2022-12-24 DIAGNOSIS — S301XXA Contusion of abdominal wall, initial encounter: Secondary | ICD-10-CM | POA: Diagnosis not present

## 2022-12-24 DIAGNOSIS — I2102 ST elevation (STEMI) myocardial infarction involving left anterior descending coronary artery: Principal | ICD-10-CM | POA: Diagnosis present

## 2022-12-24 DIAGNOSIS — S1093XA Contusion of unspecified part of neck, initial encounter: Secondary | ICD-10-CM | POA: Diagnosis not present

## 2022-12-24 DIAGNOSIS — I5021 Acute systolic (congestive) heart failure: Secondary | ICD-10-CM | POA: Diagnosis not present

## 2022-12-24 DIAGNOSIS — I2510 Atherosclerotic heart disease of native coronary artery without angina pectoris: Secondary | ICD-10-CM | POA: Diagnosis not present

## 2022-12-24 DIAGNOSIS — Z888 Allergy status to other drugs, medicaments and biological substances status: Secondary | ICD-10-CM

## 2022-12-24 DIAGNOSIS — R570 Cardiogenic shock: Secondary | ICD-10-CM | POA: Diagnosis not present

## 2022-12-24 DIAGNOSIS — K5730 Diverticulosis of large intestine without perforation or abscess without bleeding: Secondary | ICD-10-CM | POA: Diagnosis not present

## 2022-12-24 DIAGNOSIS — G8929 Other chronic pain: Secondary | ICD-10-CM | POA: Diagnosis present

## 2022-12-24 DIAGNOSIS — E7800 Pure hypercholesterolemia, unspecified: Secondary | ICD-10-CM | POA: Diagnosis present

## 2022-12-24 DIAGNOSIS — J9601 Acute respiratory failure with hypoxia: Secondary | ICD-10-CM | POA: Diagnosis present

## 2022-12-24 DIAGNOSIS — R011 Cardiac murmur, unspecified: Secondary | ICD-10-CM | POA: Diagnosis present

## 2022-12-24 DIAGNOSIS — R202 Paresthesia of skin: Secondary | ICD-10-CM | POA: Diagnosis not present

## 2022-12-24 DIAGNOSIS — Z7982 Long term (current) use of aspirin: Secondary | ICD-10-CM

## 2022-12-24 DIAGNOSIS — I491 Atrial premature depolarization: Secondary | ICD-10-CM | POA: Diagnosis not present

## 2022-12-24 DIAGNOSIS — Z66 Do not resuscitate: Secondary | ICD-10-CM | POA: Diagnosis not present

## 2022-12-24 DIAGNOSIS — R0902 Hypoxemia: Secondary | ICD-10-CM | POA: Diagnosis not present

## 2022-12-24 HISTORY — PX: VENTRICULAR ASSIST DEVICE INSERTION: CATH118273

## 2022-12-24 HISTORY — PX: CORONARY/GRAFT ACUTE MI REVASCULARIZATION: CATH118305

## 2022-12-24 HISTORY — PX: RIGHT HEART CATH: CATH118263

## 2022-12-24 LAB — COMPREHENSIVE METABOLIC PANEL
ALT: 21 U/L (ref 0–44)
AST: 25 U/L (ref 15–41)
Albumin: 3.2 g/dL — ABNORMAL LOW (ref 3.5–5.0)
Alkaline Phosphatase: 46 U/L (ref 38–126)
Anion gap: 13 (ref 5–15)
BUN: 18 mg/dL (ref 8–23)
CO2: 16 mmol/L — ABNORMAL LOW (ref 22–32)
Calcium: 8.6 mg/dL — ABNORMAL LOW (ref 8.9–10.3)
Chloride: 106 mmol/L (ref 98–111)
Creatinine, Ser: 1.05 mg/dL (ref 0.61–1.24)
GFR, Estimated: >60 mL/min (ref >60)
Glucose, Bld: 286 mg/dL — ABNORMAL HIGH (ref 70–99)
Potassium: 3.7 mmol/L (ref 3.5–5.1)
Sodium: 135 mmol/L (ref 135–145)
Total Bilirubin: 0.7 mg/dL (ref 0.3–1.2)
Total Protein: 6.1 g/dL — ABNORMAL LOW (ref 6.5–8.1)

## 2022-12-24 LAB — CBC
HCT: 33.3 % — ABNORMAL LOW (ref 39.0–52.0)
HCT: 31.1 % — ABNORMAL LOW (ref 39.0–52.0)
Hemoglobin: 10.6 g/dL — ABNORMAL LOW (ref 13.0–17.0)
Hemoglobin: 10.1 g/dL — ABNORMAL LOW (ref 13.0–17.0)
MCH: 27.8 pg (ref 26.0–34.0)
MCH: 28.4 pg (ref 26.0–34.0)
MCHC: 31.8 g/dL (ref 30.0–36.0)
MCHC: 32.5 g/dL (ref 30.0–36.0)
MCV: 87.4 fL (ref 80.0–100.0)
MCV: 87.4 fL (ref 80.0–100.0)
Platelets: 173 10*3/uL (ref 150–400)
Platelets: 159 10*3/uL (ref 150–400)
RBC: 3.81 MIL/uL — ABNORMAL LOW (ref 4.22–5.81)
RBC: 3.56 MIL/uL — ABNORMAL LOW (ref 4.22–5.81)
RDW: 16.8 % — ABNORMAL HIGH (ref 11.5–15.5)
RDW: 16.6 % — ABNORMAL HIGH (ref 11.5–15.5)
WBC: 8.7 10*3/uL (ref 4.0–10.5)
WBC: 8.1 10*3/uL (ref 4.0–10.5)
nRBC: 0 % (ref 0.0–0.2)
nRBC: 0 % (ref 0.0–0.2)

## 2022-12-24 LAB — POCT ACTIVATED CLOTTING TIME
Activated Clotting Time: 210 s
Activated Clotting Time: 279 s
Activated Clotting Time: 302 s
Activated Clotting Time: 227 s
Activated Clotting Time: 135 s

## 2022-12-24 LAB — CBC WITH DIFFERENTIAL/PLATELET
Abs Immature Granulocytes: 0.02 10*3/uL (ref 0.00–0.07)
Basophils Absolute: 0 10*3/uL (ref 0.0–0.1)
Basophils Relative: 1 %
Eosinophils Absolute: 0.1 10*3/uL (ref 0.0–0.5)
Eosinophils Relative: 1 %
HCT: 32.5 % — ABNORMAL LOW (ref 39.0–52.0)
Hemoglobin: 10.7 g/dL — ABNORMAL LOW (ref 13.0–17.0)
Immature Granulocytes: 0 %
Lymphocytes Relative: 16 %
Lymphs Abs: 1.1 10*3/uL (ref 0.7–4.0)
MCH: 27.9 pg (ref 26.0–34.0)
MCHC: 32.9 g/dL (ref 30.0–36.0)
MCV: 84.9 fL (ref 80.0–100.0)
Monocytes Absolute: 0.5 10*3/uL (ref 0.1–1.0)
Monocytes Relative: 7 %
Neutro Abs: 5.4 10*3/uL (ref 1.7–7.7)
Neutrophils Relative %: 75 %
Platelets: 154 10*3/uL (ref 150–400)
RBC: 3.83 MIL/uL — ABNORMAL LOW (ref 4.22–5.81)
RDW: 16.4 % — ABNORMAL HIGH (ref 11.5–15.5)
WBC: 7.2 10*3/uL (ref 4.0–10.5)
nRBC: 0 % (ref 0.0–0.2)

## 2022-12-24 LAB — POCT I-STAT 7, (LYTES, BLD GAS, ICA,H+H)
Acid-base deficit: 11 mmol/L — ABNORMAL HIGH (ref 0.0–2.0)
Acid-base deficit: 12 mmol/L — ABNORMAL HIGH (ref 0.0–2.0)
Bicarbonate: 15.3 mmol/L — ABNORMAL LOW (ref 20.0–28.0)
Bicarbonate: 15.3 mmol/L — ABNORMAL LOW (ref 20.0–28.0)
Calcium, Ion: 1.13 mmol/L — ABNORMAL LOW (ref 1.15–1.40)
Calcium, Ion: 1.15 mmol/L (ref 1.15–1.40)
HCT: 36 % — ABNORMAL LOW (ref 39.0–52.0)
HCT: 40 % (ref 39.0–52.0)
Hemoglobin: 12.2 g/dL — ABNORMAL LOW (ref 13.0–17.0)
Hemoglobin: 13.6 g/dL (ref 13.0–17.0)
O2 Saturation: 75 %
O2 Saturation: 91 %
Potassium: 3.5 mmol/L (ref 3.5–5.1)
Potassium: 3.6 mmol/L (ref 3.5–5.1)
Sodium: 121 mmol/L — ABNORMAL LOW (ref 135–145)
Sodium: 124 mmol/L — ABNORMAL LOW (ref 135–145)
TCO2: 16 mmol/L — ABNORMAL LOW (ref 22–32)
TCO2: 16 mmol/L — ABNORMAL LOW (ref 22–32)
pCO2 arterial: 34.5 mm[Hg] (ref 32–48)
pCO2 arterial: 37.9 mm[Hg] (ref 32–48)
pH, Arterial: 7.214 — ABNORMAL LOW (ref 7.35–7.45)
pH, Arterial: 7.254 — ABNORMAL LOW (ref 7.35–7.45)
pO2, Arterial: 46 mm[Hg] — ABNORMAL LOW (ref 83–108)
pO2, Arterial: 73 mm[Hg] — ABNORMAL LOW (ref 83–108)

## 2022-12-24 LAB — COOXEMETRY PANEL
Carboxyhemoglobin: 1.7 % — ABNORMAL HIGH (ref 0.5–1.5)
Carboxyhemoglobin: 1.9 % — ABNORMAL HIGH (ref 0.5–1.5)
Carboxyhemoglobin: 1.6 % — ABNORMAL HIGH (ref 0.5–1.5)
Carboxyhemoglobin: 2.2 % — ABNORMAL HIGH (ref 0.5–1.5)
Methemoglobin: <0.7 % (ref 0.0–1.5)
Methemoglobin: 0.8 % (ref 0.0–1.5)
Methemoglobin: <0.7 % (ref 0.0–1.5)
Methemoglobin: <0.7 % (ref 0.0–1.5)
O2 Saturation: 53.2 %
O2 Saturation: 46 %
O2 Saturation: 41.1 %
O2 Saturation: 55.1 %
Total hemoglobin: 11.8 g/dL — ABNORMAL LOW (ref 12.0–16.0)
Total hemoglobin: 10.9 g/dL — ABNORMAL LOW (ref 12.0–16.0)
Total hemoglobin: 10.6 g/dL — ABNORMAL LOW (ref 12.0–16.0)
Total hemoglobin: 10 g/dL — ABNORMAL LOW (ref 12.0–16.0)

## 2022-12-24 LAB — POCT I-STAT EG7
Acid-base deficit: 6 mmol/L — ABNORMAL HIGH (ref 0.0–2.0)
Acid-base deficit: 5 mmol/L — ABNORMAL HIGH (ref 0.0–2.0)
Acid-base deficit: 7 mmol/L — ABNORMAL HIGH (ref 0.0–2.0)
Bicarbonate: 19.6 mmol/L — ABNORMAL LOW (ref 20.0–28.0)
Bicarbonate: 17.4 mmol/L — ABNORMAL LOW (ref 20.0–28.0)
Bicarbonate: 20.2 mmol/L (ref 20.0–28.0)
Calcium, Ion: 1.15 mmol/L (ref 1.15–1.40)
Calcium, Ion: 1.15 mmol/L (ref 1.15–1.40)
Calcium, Ion: 1.18 mmol/L (ref 1.15–1.40)
HCT: 38 % — ABNORMAL LOW (ref 39.0–52.0)
HCT: 37 % — ABNORMAL LOW (ref 39.0–52.0)
HCT: 38 % — ABNORMAL LOW (ref 39.0–52.0)
Hemoglobin: 12.9 g/dL — ABNORMAL LOW (ref 13.0–17.0)
Hemoglobin: 12.6 g/dL — ABNORMAL LOW (ref 13.0–17.0)
Hemoglobin: 12.9 g/dL — ABNORMAL LOW (ref 13.0–17.0)
O2 Saturation: 53 %
O2 Saturation: 55 %
O2 Saturation: 87 %
Potassium: 4.1 mmol/L (ref 3.5–5.1)
Potassium: 4 mmol/L (ref 3.5–5.1)
Potassium: 4.3 mmol/L (ref 3.5–5.1)
Sodium: 136 mmol/L (ref 135–145)
Sodium: 136 mmol/L (ref 135–145)
Sodium: 136 mmol/L (ref 135–145)
TCO2: 21 mmol/L — ABNORMAL LOW (ref 22–32)
TCO2: 18 mmol/L — ABNORMAL LOW (ref 22–32)
TCO2: 21 mmol/L — ABNORMAL LOW (ref 22–32)
pCO2, Ven: 37.7 mm[Hg] — ABNORMAL LOW (ref 44–60)
pCO2, Ven: 31.7 mm[Hg] — ABNORMAL LOW (ref 44–60)
pCO2, Ven: 38.5 mm[Hg] — ABNORMAL LOW (ref 44–60)
pH, Ven: 7.324 (ref 7.25–7.43)
pH, Ven: 7.328 (ref 7.25–7.43)
pH, Ven: 7.347 (ref 7.25–7.43)
pO2, Ven: 30 mm[Hg] — CL (ref 32–45)
pO2, Ven: 31 mm[Hg] — CL (ref 32–45)
pO2, Ven: 54 mm[Hg] — ABNORMAL HIGH (ref 32–45)

## 2022-12-24 LAB — POCT I-STAT, CHEM 8
BUN: 17 mg/dL (ref 8–23)
Calcium, Ion: 1.2 mmol/L (ref 1.15–1.40)
Chloride: 107 mmol/L (ref 98–111)
Creatinine, Ser: 0.8 mg/dL (ref 0.61–1.24)
Glucose, Bld: 284 mg/dL — ABNORMAL HIGH (ref 70–99)
HCT: 34 % — ABNORMAL LOW (ref 39.0–52.0)
Hemoglobin: 11.6 g/dL — ABNORMAL LOW (ref 13.0–17.0)
Potassium: 3.8 mmol/L (ref 3.5–5.1)
Sodium: 138 mmol/L (ref 135–145)
TCO2: 16 mmol/L — ABNORMAL LOW (ref 22–32)

## 2022-12-24 LAB — GLUCOSE, CAPILLARY
Glucose-Capillary: 318 mg/dL — ABNORMAL HIGH (ref 70–99)
Glucose-Capillary: 320 mg/dL — ABNORMAL HIGH (ref 70–99)
Glucose-Capillary: 226 mg/dL — ABNORMAL HIGH (ref 70–99)

## 2022-12-24 LAB — LIPID PANEL
Cholesterol: 143 mg/dL (ref 0–200)
HDL: 70 mg/dL (ref >40)
LDL Cholesterol: 59 mg/dL (ref 0–99)
Total CHOL/HDL Ratio: 2 {ratio}
Triglycerides: 72 mg/dL (ref <150)
VLDL: 14 mg/dL (ref 0–40)

## 2022-12-24 LAB — APTT: aPTT: 27 s (ref 24–36)

## 2022-12-24 LAB — HEMOGLOBIN A1C
Hgb A1c MFr Bld: 8.2 % — ABNORMAL HIGH (ref 4.8–5.6)
Mean Plasma Glucose: 188.64 mg/dL

## 2022-12-24 LAB — MRSA NEXT GEN BY PCR, NASAL: MRSA by PCR Next Gen: NOT DETECTED

## 2022-12-24 LAB — ECHOCARDIOGRAM COMPLETE
Est EF: <20
S' Lateral: 5.3 cm

## 2022-12-24 LAB — CG4 I-STAT (LACTIC ACID)
Lactic Acid, Venous: 2.2 mmol/L (ref 0.5–1.9)
Lactic Acid, Venous: 1.7 mmol/L (ref 0.5–1.9)

## 2022-12-24 LAB — HEPARIN LEVEL (UNFRACTIONATED): Heparin Unfractionated: <0.1 [IU]/mL — ABNORMAL LOW (ref 0.30–0.70)

## 2022-12-24 LAB — LACTIC ACID, PLASMA
Lactic Acid, Venous: 2.4 mmol/L (ref 0.5–1.9)
Lactic Acid, Venous: 2.3 mmol/L (ref 0.5–1.9)

## 2022-12-24 LAB — PROTIME-INR
INR: 1 (ref 0.8–1.2)
Prothrombin Time: 13.6 s (ref 11.4–15.2)

## 2022-12-24 LAB — LACTATE DEHYDROGENASE: LDH: 908 U/L — ABNORMAL HIGH (ref 98–192)

## 2022-12-24 LAB — TROPONIN I (HIGH SENSITIVITY): Troponin I (High Sensitivity): 703 ng/L (ref <18)

## 2022-12-24 SURGERY — CORONARY/GRAFT ACUTE MI REVASCULARIZATION
Anesthesia: LOCAL

## 2022-12-24 MED ORDER — EPINEPHRINE HCL 5 MG/250ML IV SOLN IN NS
INTRAVENOUS | Status: AC
  Filled 2022-12-24: qty 250

## 2022-12-24 MED ORDER — VERAPAMIL HCL 2.5 MG/ML IV SOLN
INTRAVENOUS | Status: DC | PRN
  Administered 2022-12-24: 10 mL via INTRA_ARTERIAL

## 2022-12-24 MED ORDER — VASOPRESSIN 20 UNITS/100 ML INFUSION FOR SHOCK
0.0000 [IU]/min | INTRAVENOUS | Status: DC
  Filled 2022-12-24 (×2): qty 100

## 2022-12-24 MED ORDER — NOREPINEPHRINE BITARTRATE 1 MG/ML IV SOLN
INTRAVENOUS | Status: DC | PRN
  Administered 2022-12-24: 2.5 ug/min via INTRAVENOUS

## 2022-12-24 MED ORDER — ONDANSETRON HCL 4 MG/2ML IJ SOLN
INTRAMUSCULAR | Status: AC
  Filled 2022-12-24: qty 4

## 2022-12-24 MED ORDER — HYDRALAZINE HCL 20 MG/ML IJ SOLN: 10.0000 mg | INTRAMUSCULAR | Status: AC | PRN

## 2022-12-24 MED ORDER — HYDRALAZINE HCL 20 MG/ML IJ SOLN
10.0000 mg | INTRAMUSCULAR | Status: AC | PRN
  Administered 2022-12-24: 10 mg via INTRAVENOUS
  Filled 2022-12-24: qty 0.5
  Filled 2022-12-24: qty 1

## 2022-12-24 MED ORDER — ROSUVASTATIN CALCIUM 20 MG PO TABS
20.0000 mg | ORAL_TABLET | Freq: Every day | ORAL | Status: DC
  Administered 2022-12-24 – 2022-12-26 (×3): 20 mg via ORAL
  Filled 2022-12-24 (×3): qty 1

## 2022-12-24 MED ORDER — FUROSEMIDE 10 MG/ML IJ SOLN
INTRAMUSCULAR | Status: AC
  Filled 2022-12-24: qty 4

## 2022-12-24 MED ORDER — ORAL CARE MOUTH RINSE: 15.0000 mL | OROMUCOSAL | Status: DC | PRN

## 2022-12-24 MED ORDER — HEPARIN SODIUM (PORCINE) 1000 UNIT/ML IJ SOLN
INTRAMUSCULAR | Status: AC
  Filled 2022-12-24: qty 10

## 2022-12-24 MED ORDER — FENTANYL CITRATE (PF) 100 MCG/2ML IJ SOLN
INTRAMUSCULAR | Status: DC | PRN
  Administered 2022-12-24: 50 ug via INTRAVENOUS

## 2022-12-24 MED ORDER — TICAGRELOR 90 MG PO TABS
ORAL_TABLET | ORAL | Status: DC | PRN
  Administered 2022-12-24: 180 mg via ORAL

## 2022-12-24 MED ORDER — HEPARIN (PORCINE) IN NACL 1000-0.9 UT/500ML-% IV SOLN
INTRAVENOUS | Status: DC | PRN
  Administered 2022-12-24 (×2): 500 mL

## 2022-12-24 MED ORDER — LIDOCAINE HCL (PF) 1 % IJ SOLN
INTRAMUSCULAR | Status: DC | PRN
  Administered 2022-12-24: 5 mL
  Administered 2022-12-24: 2 mL
  Administered 2022-12-24 (×2): 10 mL

## 2022-12-24 MED ORDER — TAMSULOSIN HCL 0.4 MG PO CAPS
0.4000 mg | ORAL_CAPSULE | Freq: Every day | ORAL | Status: DC
  Administered 2022-12-24 – 2022-12-29 (×6): 0.4 mg via ORAL
  Filled 2022-12-24 (×6): qty 1

## 2022-12-24 MED ORDER — ASPIRIN 325 MG PO TABS
ORAL_TABLET | ORAL | Status: DC | PRN
  Administered 2022-12-24: 325 mg via ORAL

## 2022-12-24 MED ORDER — MIDAZOLAM HCL 2 MG/2ML IJ SOLN
INTRAMUSCULAR | Status: DC | PRN
  Administered 2022-12-24: 1 mg via INTRAVENOUS

## 2022-12-24 MED ORDER — HEPARIN (PORCINE) 25000 UT/250ML-% IV SOLN
850.0000 [IU]/h | INTRAVENOUS | Status: DC
  Administered 2022-12-24: 550 [IU]/h via INTRAVENOUS
  Administered 2022-12-25: 850 [IU]/h via INTRAVENOUS
  Filled 2022-12-24 (×2): qty 250

## 2022-12-24 MED ORDER — TICAGRELOR 90 MG PO TABS
ORAL_TABLET | ORAL | Status: AC
  Filled 2022-12-24: qty 2

## 2022-12-24 MED ORDER — FENTANYL CITRATE (PF) 100 MCG/2ML IJ SOLN
INTRAMUSCULAR | Status: AC
  Filled 2022-12-24: qty 2

## 2022-12-24 MED ORDER — SODIUM CHLORIDE 0.9% FLUSH: 3.0000 mL | INTRAVENOUS | Status: DC | PRN

## 2022-12-24 MED ORDER — SODIUM CHLORIDE 0.9 % IV SOLN
250.0000 mL | INTRAVENOUS | Status: AC | PRN
Start: 2022-12-24 — End: 2022-12-25

## 2022-12-24 MED ORDER — MILRINONE LACTATE IN DEXTROSE 20-5 MG/100ML-% IV SOLN
INTRAVENOUS | Status: AC
  Administered 2022-12-24: 0.125 ug/kg/min via INTRAVENOUS
  Filled 2022-12-24: qty 100

## 2022-12-24 MED ORDER — SODIUM CHLORIDE 0.9 % IV SOLN
250.0000 mL | INTRAVENOUS | Status: AC | PRN
Start: 2022-12-24 — End: 2022-12-25
  Administered 2022-12-24: 250 mL via INTRAVENOUS

## 2022-12-24 MED ORDER — HEPARIN SODIUM (PORCINE) 1000 UNIT/ML IJ SOLN
INTRAMUSCULAR | Status: DC | PRN
  Administered 2022-12-24 (×3): 4000 [IU] via INTRAVENOUS

## 2022-12-24 MED ORDER — VERAPAMIL HCL 2.5 MG/ML IV SOLN
INTRAVENOUS | Status: AC
  Filled 2022-12-24: qty 2

## 2022-12-24 MED ORDER — EPINEPHRINE HCL 5 MG/250ML IV SOLN IN NS
0.5000 ug/min | INTRAVENOUS | Status: DC
  Filled 2022-12-24: qty 250

## 2022-12-24 MED ORDER — ONDANSETRON HCL 4 MG/2ML IJ SOLN
INTRAMUSCULAR | Status: DC | PRN
  Administered 2022-12-24 (×2): 4 mg via INTRAVENOUS

## 2022-12-24 MED ORDER — ETOMIDATE 2 MG/ML IV SOLN
INTRAVENOUS | Status: AC
  Filled 2022-12-24: qty 20

## 2022-12-24 MED ORDER — ONDANSETRON HCL 4 MG/2ML IJ SOLN
4.0000 mg | Freq: Four times a day (QID) | INTRAMUSCULAR | Status: DC | PRN
  Administered 2022-12-24 – 2022-12-29 (×3): 4 mg via INTRAVENOUS
  Filled 2022-12-24 (×3): qty 2

## 2022-12-24 MED ORDER — INSULIN GLARGINE-YFGN 100 UNIT/ML ~~LOC~~ SOLN
10.0000 [IU] | Freq: Every day | SUBCUTANEOUS | Status: DC
  Administered 2022-12-24: 10 [IU] via SUBCUTANEOUS
  Filled 2022-12-24 (×2): qty 0.1

## 2022-12-24 MED ORDER — MILRINONE LACTATE IN DEXTROSE 20-5 MG/100ML-% IV SOLN: 0.1250 ug/kg/min | INTRAVENOUS | Status: DC

## 2022-12-24 MED ORDER — FUROSEMIDE 10 MG/ML IJ SOLN
INTRAMUSCULAR | Status: DC | PRN
  Administered 2022-12-24: 40 mg via INTRAVENOUS

## 2022-12-24 MED ORDER — SODIUM CHLORIDE 0.9 % IV SOLN: INTRAVENOUS | Status: AC | PRN

## 2022-12-24 MED ORDER — IOHEXOL 350 MG/ML SOLN
INTRAVENOUS | Status: DC | PRN
  Administered 2022-12-24: 190 mL via INTRA_ARTERIAL

## 2022-12-24 MED ORDER — NOREPINEPHRINE 4 MG/250ML-% IV SOLN
0.0000 ug/min | INTRAVENOUS | Status: DC
  Administered 2022-12-24: 7.5 ug/min via INTRAVENOUS

## 2022-12-24 MED ORDER — MILRINONE LACTATE IN DEXTROSE 20-5 MG/100ML-% IV SOLN
0.1250 ug/kg/min | INTRAVENOUS | Status: DC
  Administered 2022-12-25 – 2022-12-27 (×3): 0.125 ug/kg/min via INTRAVENOUS
  Administered 2022-12-28: 0.25 ug/kg/min via INTRAVENOUS
  Administered 2022-12-29: 0.125 ug/kg/min via INTRAVENOUS
  Filled 2022-12-24 (×4): qty 100

## 2022-12-24 MED ORDER — CHLORHEXIDINE GLUCONATE CLOTH 2 % EX PADS
6.0000 | MEDICATED_PAD | Freq: Every day | CUTANEOUS | Status: DC
  Administered 2022-12-25 – 2022-12-29 (×5): 6 via TOPICAL

## 2022-12-24 MED ORDER — SODIUM CHLORIDE 0.9% FLUSH
3.0000 mL | Freq: Two times a day (BID) | INTRAVENOUS | Status: DC
  Administered 2022-12-24 – 2022-12-29 (×9): 3 mL via INTRAVENOUS

## 2022-12-24 MED ORDER — PANTOPRAZOLE SODIUM 40 MG PO TBEC
40.0000 mg | DELAYED_RELEASE_TABLET | Freq: Every day | ORAL | Status: DC
  Administered 2022-12-24 – 2022-12-29 (×6): 40 mg via ORAL
  Filled 2022-12-24 (×6): qty 1

## 2022-12-24 MED ORDER — MIDAZOLAM HCL 2 MG/2ML IJ SOLN
INTRAMUSCULAR | Status: AC
  Filled 2022-12-24: qty 2

## 2022-12-24 MED ORDER — LABETALOL HCL 5 MG/ML IV SOLN
10.0000 mg | INTRAVENOUS | Status: AC | PRN
Start: 2022-12-24 — End: 2022-12-24
  Administered 2022-12-24: 10 mg via INTRAVENOUS
  Filled 2022-12-24: qty 4

## 2022-12-24 MED ORDER — FUROSEMIDE 10 MG/ML IJ SOLN
40.0000 mg | Freq: Once | INTRAMUSCULAR | Status: AC
  Administered 2022-12-24: 40 mg via INTRAVENOUS
  Filled 2022-12-24: qty 4

## 2022-12-24 MED ORDER — SERTRALINE HCL 50 MG PO TABS
50.0000 mg | ORAL_TABLET | Freq: Every day | ORAL | Status: DC
  Administered 2022-12-24 – 2022-12-29 (×6): 50 mg via ORAL
  Filled 2022-12-24 (×6): qty 1

## 2022-12-24 MED ORDER — ASPIRIN 81 MG PO TBEC
81.0000 mg | DELAYED_RELEASE_TABLET | Freq: Every day | ORAL | Status: DC
  Administered 2022-12-25 – 2022-12-29 (×5): 81 mg via ORAL
  Filled 2022-12-24 (×5): qty 1

## 2022-12-24 MED ORDER — SUCCINYLCHOLINE CHLORIDE 200 MG/10ML IV SOSY
PREFILLED_SYRINGE | INTRAVENOUS | Status: AC
  Filled 2022-12-24: qty 10

## 2022-12-24 MED ORDER — INSULIN ASPART 100 UNIT/ML IJ SOLN
0.0000 [IU] | Freq: Three times a day (TID) | INTRAMUSCULAR | Status: DC
  Administered 2022-12-24: 11 [IU] via SUBCUTANEOUS
  Administered 2022-12-25 (×3): 8 [IU] via SUBCUTANEOUS
  Administered 2022-12-26 (×2): 2 [IU] via SUBCUTANEOUS
  Administered 2022-12-26: 3 [IU] via SUBCUTANEOUS
  Administered 2022-12-27: 8 [IU] via SUBCUTANEOUS
  Administered 2022-12-27: 5 [IU] via SUBCUTANEOUS
  Administered 2022-12-27 – 2022-12-28 (×2): 3 [IU] via SUBCUTANEOUS
  Administered 2022-12-28: 2 [IU] via SUBCUTANEOUS
  Administered 2022-12-28: 3 [IU] via SUBCUTANEOUS
  Administered 2022-12-29: 11 [IU] via SUBCUTANEOUS
  Administered 2022-12-29: 3 [IU] via SUBCUTANEOUS

## 2022-12-24 MED ORDER — ROCURONIUM BROMIDE 10 MG/ML (PF) SYRINGE
PREFILLED_SYRINGE | INTRAVENOUS | Status: AC
  Filled 2022-12-24: qty 10

## 2022-12-24 MED ORDER — HYDRALAZINE HCL 10 MG PO TABS
10.0000 mg | ORAL_TABLET | Freq: Three times a day (TID) | ORAL | Status: DC
  Administered 2022-12-24 – 2022-12-25 (×3): 10 mg via ORAL
  Filled 2022-12-24 (×4): qty 1

## 2022-12-24 MED ORDER — HYDRALAZINE HCL 20 MG/ML IJ SOLN: 10.0000 mg | INTRAMUSCULAR | Status: DC | PRN

## 2022-12-24 MED ORDER — TICAGRELOR 90 MG PO TABS
90.0000 mg | ORAL_TABLET | Freq: Two times a day (BID) | ORAL | Status: DC
  Administered 2022-12-24 – 2022-12-29 (×10): 90 mg via ORAL
  Filled 2022-12-24 (×10): qty 1

## 2022-12-24 MED ORDER — LIDOCAINE HCL (PF) 1 % IJ SOLN
INTRAMUSCULAR | Status: AC
  Filled 2022-12-24: qty 30

## 2022-12-24 MED ORDER — NITROGLYCERIN 1 MG/10 ML FOR IR/CATH LAB
INTRA_ARTERIAL | Status: DC | PRN
  Administered 2022-12-24: 100 ug via INTRACORONARY

## 2022-12-24 MED ORDER — ACETAMINOPHEN 325 MG PO TABS
650.0000 mg | ORAL_TABLET | ORAL | Status: DC | PRN
  Administered 2022-12-24 – 2022-12-25 (×2): 650 mg via ORAL
  Filled 2022-12-24 (×2): qty 2

## 2022-12-24 SURGICAL SUPPLY — 32 items
BALLN SAPPHIRE 2.0X12 (BALLOONS) ×1
BALLOON SAPPHIRE 2.0X12 (BALLOONS) IMPLANT
BALLOON TAKERU 1.5X12 (BALLOONS) IMPLANT
CATH INFINITI 5FR ANG PIGTAIL (CATHETERS) IMPLANT
CATH INFINITI AMBI 5FR TG (CATHETERS) IMPLANT
CATH LAUNCHER 6FR EBU3.5 (CATHETERS) IMPLANT
CATH SWAN GANZ 7F STRAIGHT (CATHETERS) IMPLANT
DEVICE IMPELLA CP SMRT ASSIST (CATHETERS) IMPLANT
DEVICE RAD COMP TR BAND LRG (VASCULAR PRODUCTS) IMPLANT
ELECT DEFIB PAD ADLT CADENCE (PAD) IMPLANT
GLIDESHEATH SLEND A-KIT 6F 22G (SHEATH) IMPLANT
GUIDEWIRE .025 260CM (WIRE) IMPLANT
GUIDEWIRE INQWIRE 1.5J.035X260 (WIRE) IMPLANT
INQWIRE 1.5J .035X260CM (WIRE) ×1
KIT ENCORE 26 ADVANTAGE (KITS) IMPLANT
KIT HEMO VALVE WATCHDOG (MISCELLANEOUS) IMPLANT
KIT MICROPUNCTURE NIT STIFF (SHEATH) IMPLANT
MAT PREVALON FULL STRYKER (MISCELLANEOUS) IMPLANT
PACK CARDIAC CATHETERIZATION (CUSTOM PROCEDURE TRAY) ×2 IMPLANT
SET ATX-X65L (MISCELLANEOUS) IMPLANT
SHEATH 6FR 75 DEST SLENDER (SHEATH) IMPLANT
SHEATH PINNACLE 7F 10CM (SHEATH) IMPLANT
SHEATH PROBE COVER 6X72 (BAG) IMPLANT
SHIELD CATH-GARD CONTAMINATION (MISCELLANEOUS) IMPLANT
STENT SYNERGY XD 2.50X20 (Permanent Stent) IMPLANT
STENT SYNERGY XD 2.75X12 (Permanent Stent) IMPLANT
SYNERGY XD 2.50X20 (Permanent Stent) ×1 IMPLANT
SYNERGY XD 2.75X12 (Permanent Stent) ×1 IMPLANT
SYSTEM COMPRESSION FEMOSTOP (HEMOSTASIS) IMPLANT
WIRE ASAHI PROWATER 180CM (WIRE) IMPLANT
WIRE EMERALD 3MM-J .035X150CM (WIRE) IMPLANT
WIRE HI TORQ WHISPER MS 190CM (WIRE) IMPLANT

## 2022-12-24 NOTE — Consult Note (Signed)
NAME:  Jeremy Patton, MRN:  [id number], DOB:  [birth year], LOS: 0 ADMISSION DATE:  01/21/2023, CONSULTATION DATE:  11/1 REFERRING MD:  Rosemary Holms, CHIEF COMPLAINT:  acute hypoxic resp failure    History of Present Illness:  This is a 82 year old male who presented to the ED w/ acute onset of chest pain and EKG c/w anterior/inferior STEMI. He was brought emergently to the cardiac cath lab while in cardiogenic shock. Underwent emergent left heart cath. This showed: 80% calcified prox and 80% mid occlusion of LAD (felt culprit vessel), distal apical was 80% diseased. The RCA and Lcx both 100% occluded felt chronic.  He underwent PCI w/ overlap DES  of LAD Impella placed. Right PAC floated. Right groin hematoma noted post-procedure. Pressure from fem stop currently being applied.  Desaturated to 70s while in cath lab. Placed on NRB On arrival to ICU hemodynamics improving some. Norepi needs improved w/ Impella mechanica assist and current SBP in 130s. Sats still 87-88% on NRB so critical care asked to see. He was placed in reverse trendelenburg to avoid malposition of Impella and placed on NIPPV.  Pertinent  Medical History  Hypertension, type II diabetes, BPH, chronic low back pain, prior bilateral carotid endarterectomy, HLD, GERD, former smoker (stopped in 83) Significant Hospital Events: Including procedures, antibiotic start and stop dates in addition to other pertinent events   11/1 admitted w/ STEMI ant/inferior. Presented in cardiogenic shock. Went directly to cath lab. Impella placed. Left heart cath showing severely diseased LAD (the mid LAD felt culprit lesion) underwent PVI w/ overlapping DES to the LAD. Right heart cath placed. Desaturated during cath lab. Remained hypoxic on NRB after arrival to ICU. Did get lasix during the cardiac cath. PCCM asked to see for hypoxia. At same time advanced HF team consulting for cardiogenic shock   Interim History / Subjective:   No current chest  pain. Does not feel short of breath but O2 sats 88% on 100% NRB. No sig accessory use   Objective   Blood pressure (!) 149/91, pulse (!) 0, resp. rate (!) 22, SpO2 (!) 80%.       No intake or output data in the 24 hours ending 01/14/2023 1317 There were no vitals filed for this visit.  Examination: General: 82 year old male now in reverse trendelenburg position w/ NIPPV mask in place.  HENT: NCAT no obvious JVD. Has right internal jugular SGC Lungs: fine crackles both bases. Now on CPAP 8 no accessory use. Sats now 100% on 50% (he felt 10 cmH2O was too much pressure). Soft raised palpable mass area over right clavicular head (nursing marking it. Pt states chronic)  PCXR PAC in satisfactory position. The Impella device in in left ventricle. He does have diffuse pulmonary edema on CXR  Cardiovascular: RRR w/ no MRG. ST on tele current flow 3.5 liters. Norepi off  Abdomen: soft  Extremities: warm. The right fem pressure devise in in place. Exts warm currently palpable bilaterally  Neuro: oriented X 3 GU: cl yellow   Resolved Hospital Problem list     Assessment & Plan:  Cardiogenic shock w/ resultant lactic acidosis due to  STEMI of mid LAD. Now s/p PCI and  overlapping DES of LAD and Impella (11/1) H/o HLD -NE turned off shortly after arrival to cicu  Plan Cont tele  Cont to monitor hemodynamics as directed by heart failure team  IPELLA support as directed by HF Holding GDMT given shock state NE for MAP > 65  Epi on standby Cont statin Cont post-op Brilinta  F/u lactate and co-ox  Acute hypoxic respiratory failure 2/2 Pulmonary edema from cardiogenic shock  -got lasix in cath lab. Had rapid and excellent response to positional change and addition of CPAP Plan Cont pulse ox CPAP at 8  HOB elevated in reverse trendelenburg  Maximize CO Diureses as hemodynamics support Am cxr  F/u echo   CKD stage 2 w/ BPH Baseline cr 0.96 to 1.14 range  Plan Ensure MAP > 65 Maximize  CO Renal dose meds Strict I&O Trend serial chems  Keep foley for now. Resume flowmax  DM type II (insulin dependent) w/ hyperglycemia  Plan Cont SSI and semglee Check beta-hydroxybutyric acid   Anemia w/ right fem hematoma w/ right internal jugular site area of concern (states chronic but also consider hematoma) -no evidence of active bleeding  Plan Trend CBC Incision sites marked Trigger for transfusion <8  GERD Plan Cont PPI    Best Practice (right click and "Reselect all SmartList Selections" daily)   Diet/type: NPO DVT prophylaxis: other started on Brilinta  GI prophylaxis: PPI Lines: Central line Foley:  Yes, and it is still needed Code Status:  full code Last date of multidisciplinary goals of care discussion [per primary ]  Labs   CBC: Recent Labs  Lab 01/18/2023 1000 01/14/2023 1003 01/20/2023 1056 01/07/2023 1114 01/04/2023 1139  WBC 7.2  --   --   --   --   NEUTROABS 5.4  --   --   --   --   HGB 10.7* 11.6* 12.2* 13.6 12.9*  HCT 32.5* 34.0* 36.0* 40.0 38.0*  MCV 84.9  --   --   --   --   PLT 154  --   --   --   --     Basic Metabolic Panel: Recent Labs  Lab 01/12/2023 1000 12/31/2022 1003 01/21/2023 1056 01/20/2023 1114 01/15/2023 1139  NA 135 138 124* 121* 136  K 3.7 3.8 3.5 3.6 4.1  CL 106 107  --   --   --   CO2 16*  --   --   --   --   GLUCOSE 286* 284*  --   --   --   BUN 18 17  --   --   --   CREATININE 1.05 0.80  --   --   --   CALCIUM 8.6*  --   --   --   --    GFR: CrCl cannot be calculated (Unknown ideal weight.). Recent Labs  Lab 12/27/2022 1000 12/28/2022 1003 12/28/2022 1130  WBC 7.2  --   --   LATICACIDVEN  --  2.2* 1.7    Liver Function Tests: Recent Labs  Lab 01/10/2023 1000  AST 25  ALT 21  ALKPHOS 46  BILITOT 0.7  PROT 6.1*  ALBUMIN 3.2*   No results for input(s): "LIPASE", "AMYLASE" in the last 168 hours. No results for input(s): "AMMONIA" in the last 168 hours.  ABG    Component Value Date/Time   PHART 7.214 (L)  01/14/2023 1114   PCO2ART 37.9 01/06/2023 1114   PO2ART 73 (L) 01/18/2023 1114   HCO3 19.6 (L) 01/02/2023 1139   TCO2 21 (L) 01/18/2023 1139   ACIDBASEDEF 6.0 (H) 12/31/2022 1139   O2SAT 53 01/01/2023 1139     Coagulation Profile: Recent Labs  Lab 01/22/2023 1000  INR 1.0    Cardiac Enzymes: No results for input(s): "CKTOTAL", "CKMB", "CKMBINDEX", "TROPONINI" in  the last 168 hours.  HbA1C: Hemoglobin A1C  Date/Time Value Ref Range Status  09/01/2022 11:18 AM 7.8 (A) 4.0 - 5.6 % Final  04/22/2022 01:46 PM 8.0 (A) 4.0 - 5.6 % Final   Hgb A1c MFr Bld  Date/Time Value Ref Range Status  01/04/2023 10:00 AM 8.2 (H) 4.8 - 5.6 % Final    Comment:    (NOTE) Pre diabetes:          5.7%-6.4%  Diabetes:              >6.4%  Glycemic control for   <7.0% adults with diabetes   07/01/2021 03:48 PM 8.3 (H) 4.8 - 5.6 % Final    Comment:             Prediabetes: 5.7 - 6.4          Diabetes: >6.4          Glycemic control for adults with diabetes: <7.0     CBG: No results for input(s): "GLUCAP" in the last 168 hours.  Review of Systems:   No chest pain. 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   Surgical History:   Past Surgical History:  Procedure Laterality Date   CATARACT EXTRACTION W/ INTRAOCULAR LENS  IMPLANT, BILATERAL     CIRCUMCISION     Age 50   COLONOSCOPY  08/22/2005 and  07/31/2009   minimal internal hemorrhoids, left-sided diverticulosis   COLONOSCOPY  N/A 11/25/2014   Procedure: COLONOSCOPY;  Surgeon: Corbin Ade, MD;  [facility];  Service: Endoscopy;  Laterality: N/A;  1030   ENDARTERECTOMY Right 05/06/2015   Procedure: Right Carotid ENDARTERECTOMY ;  Surgeon: Fransisco Hertz, MD;  Location: [facility] OR;  Service: Vascular;  Laterality: Right;   ENDARTERECTOMY Left 06/25/2015   Procedure: LEFT CAROTID ENDARTERECTOMY;  Surgeon: Fransisco Hertz, MD;  Location: [facility] OR;  Service: Vascular;  Laterality: Left;   ESOPHAGEAL DILATION N/A 11/25/2014   Procedure: ESOPHAGEAL DILATION;  Surgeon: Corbin Ade, MD;  Location: [facility];  Service: Endoscopy;  Laterality: N/A;   ESOPHAGOGASTRODUODENOSCOPY N/A 11/25/2014   Procedure: ESOPHAGOGASTRODUODENOSCOPY (EGD);  Surgeon: Corbin Ade, MD;  Location: [facility];  Service: Endoscopy;  Laterality: N/A;   EYE SURGERY     MULTIPLE TOOTH EXTRACTIONS     PATCH ANGIOPLASTY Right 05/06/2015   Procedure: PATCH ANGIOPLASTY;  Surgeon: Fransisco Hertz, MD;  Location: [facility] OR;  Service: Vascular;  Laterality: Right;   PATCH ANGIOPLASTY Left 06/25/2015   Procedure: PATCH ANGIOPLASTY USING XENOSURE BIOLOGIC PATCH 1cm X 6cm ;  Surgeon: Fransisco Hertz, MD;  Location: [facility] OR;  Service: Vascular;  Laterality: Left;   TONSILLECTOMY       Social History:   reports that he quit smoking about 31 years ago. His smoking use included cigarettes. He started smoking about 61 years ago. He has a 60 pack-year smoking history. He has never used smokeless tobacco. He reports current alcohol use of about 1.0 standard drink of alcohol per week. He reports current drug use. Drug: Marijuana.   Family History:  His family  history includes Colon cancer in his brother; Heart disease in his mother; Stroke in his mother.   Allergies Allergies  Allergen Reactions   Protamine     hypotension     Home Medications  Prior to Admission medications   Medication Sig Start Date End Date Taking? Authorizing Provider  ACCU-CHEK GUIDE test  strip  08/18/21   [provider]  Accu-Chek Softclix Lancets lancets  08/18/21   [provider]  acetaminophen (TYLENOL) 500 MG tablet Take 1,000 mg by mouth 2 (two) times daily.    [provider]  amLODipine (NORVASC) 10 MG tablet Take 1 tablet (10 mg total) by mouth daily. 05/10/22   Tommie Sams, DO  aspirin 81 MG tablet Take 81 mg by mouth daily.    [provider]  blood glucose meter kit and supplies KIT Dispense based on patient and insurance preference. Use up to four times daily as directed. 07/16/21   Tommie Sams, DO  cephALEXin (KEFLEX) 500 MG capsule Take 1 capsule (500 mg total) by mouth 2 (two) times daily. 11/28/22   Particia Nearing, PA-C  chlorhexidine (HIBICLENS) 4 % external liquid Apply topically daily as needed. 11/28/22   Particia Nearing, PA-C  clobetasol ointment (TEMOVATE) 0.05 % Apply 1 Application topically 2 (two) times daily. 09/07/22   Tommie Sams, DO  Continuous Glucose Sensor (DEXCOM G7 SENSOR) MISC Inject 1 Application into the skin as directed. Change sensor every 10 days as directed. 10/26/22   Dani Gobble, NP  LANTUS SOLOSTAR 100 UNIT/ML Solostar Pen INJECT 10 UNITS INTO THE SKIN AT BEDTIME. 11/23/22   Dani Gobble, NP  metFORMIN (GLUCOPHAGE) 1000 MG tablet Take 1 tablet (1,000 mg total) by mouth 2 (two) times daily with a meal. 04/22/22   Dani Gobble, NP  mupirocin ointment (BACTROBAN) 2 % Apply 1 Application topically 2 (two) times daily. 11/28/22   Particia Nearing, PA-C  olmesartan (BENICAR) 40 MG tablet Take 1 tablet (40 mg total) by mouth daily. 09/07/22   Tommie Sams, DO  pantoprazole (PROTONIX) 40 MG tablet TAKE (1) TABLET BY MOUTH ONCE DAILY. 12/09/22   Tommie Sams, DO  potassium chloride SA (KLOR-CON M) 20 MEQ tablet TAKE ONE TABLET BY MOUTH DAILY. 10/20/22   Tommie Sams, DO  rosuvastatin (CRESTOR) 20 MG tablet TAKE ONE TABLET BY MOUTH ONCE DAILY. 10/20/22   Tommie Sams, DO   sertraline (ZOLOFT) 50 MG tablet Take 1 tablet (50 mg total) by mouth daily. 05/10/22   Tommie Sams, DO  sitaGLIPtin (JANUVIA) 50 MG tablet Take 1 tablet (50 mg total) by mouth daily. 04/22/22   Dani Gobble, NP  tamsulosin (FLOMAX) 0.4 MG CAPS capsule TAKE (1) CAPSULE BY MOUTH ONCE DAILY. 10/05/22   Donnita Falls, FNP     Critical care time: 35 min     Simonne Martinet ACNP-BC [facility] Pager # [phone] OR # [phone] if no answer

## 2022-12-24 NOTE — Progress Notes (Signed)
Now on 6 liters PAD 9-10 mmHg Breathing comfortably  Remains off Norepi.  Impella reduced to P7 from P9 Co-ox still sub-optimal. Lactic acid elevated but expect to clear w/ time   Simonne Martinet ACNP-BC [facility] Pulmonary/Critical Care Pager # [phone] OR # [phone] if no answer

## 2022-12-24 NOTE — Progress Notes (Signed)
   01/07/2023 1015  Spiritual Encounters  Type of Visit Initial  Care provided to: Family  Referral source Code page  Reason for visit Code (STEMI)  OnCall Visit No  Spiritual Framework  Presenting Themes Impactful experiences and emotions  Community/Connection Family  Patient Stress Factors Health changes  Family Stress Factors Not reviewed  Interventions  Spiritual Care Interventions Made Established relationship of care and support;Compassionate presence;Reflective listening;Normalization of emotions;Encouragement  Intervention Outcomes  Outcomes Reduced anxiety;Reduced fear;Connection to spiritual care;Awareness of support  Spiritual Care Plan  Spiritual Care Issues Still Outstanding No further spiritual care needs at this time (see row info)   Patient came in as as code STEMI. Patient was taken directly to the cathlab. Chaplain waited for and escorted wife to 2 heart waiting area. Chaplain provided emotional and spiritual support for the patient's wife Jeremy Patton. Chaplain let patient's wife and daughter know to alert staff if they needed further spiritual support.   Arlyce Dice, Chaplain Resident [phone]

## 2022-12-24 NOTE — Progress Notes (Signed)
Pt had an episode of emesis so CPAP was removed and is on standby. Pt was placed on NRB and states WOB has improved. RN aware

## 2022-12-24 NOTE — Progress Notes (Addendum)
PHARMACY - ANTICOAGULATION CONSULT NOTE  Pharmacy Consult for heparin Indication: impella  Allergies  Allergen Reactions   Protamine Other (See Comments)    Severe hypotension    Patient Measurements:   Last weight 10/21/22 ~140lbs  Heparin Dosing Weight: ~63kg  Vital Signs: Temp: 99.3 F (37.4 C) (11/01 1615) BP: 125/100 (11/01 1615) Pulse Rate: 107 (11/01 1615)  Labs: Recent Labs    12/25/2022 1000 01/08/2023 1003 01/09/2023 1056 12/31/2022 1141 01/05/2023 1148 12/27/2022 1600  HGB 10.7* 11.6*   < > 12.6* 12.9* 10.6*  HCT 32.5* 34.0*   < > 37.0* 38.0* 33.3*  PLT 154  --   --   --   --  173  APTT 27  --   --   --   --   --   LABPROT 13.6  --   --   --   --   --   INR 1.0  --   --   --   --   --   CREATININE 1.05 0.80  --   --   --   --   TROPONINIHS 703*  --   --   --   --   --    < > = values in this interval not displayed.    CrCl cannot be calculated (Unknown ideal weight.).   Medical History: Past Medical History:  Diagnosis Date   Arthritis    Carotid artery disease (HCC)    a. s/p R CEA in 04/2015 b. 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   Diabetes mellitus without complication (HCC) 11/29/2011   GERD (gastroesophageal reflux disease)    Heart murmur    High cholesterol    History of CHF (congestive heart failure) 07/01/2021   Hyperlipidemia 11/29/2011   Hypertension 11/29/2011   Low back pain 11/29/2011   Lumbar radiculopathy 11/29/2011   Left   Pain of left lower leg    Wears glasses       Assessment: 82 yo M with STEMI s/p PCI c/b intraoperative hypotension requiring impella placement 11/1. No anticoagulation prior to admission. Pharmacy consulted for heparin.    ACT down to 135. Patient with hemolysis and urine that was dark red but is now clearing up to be light red/pink. Hgb down post procedure. No other signs of bleeding. Impella is running at P7, ~3L/min, D5W purge  (no heparin) @ 14.58ml/hr. Patient received 12,000 units heparin in cath this morning.   Goal of Therapy:  Heparin level 0.2 -0.5 units/ml Monitor platelets by anticoagulation protocol: Yes   Plan:  Start Heparin 550 units/hr F/u 6hr heparin level Monitor daily heparin level, CBC, signs/symptoms of bleeding   Alphia Moh, PharmD, BCPS, BCCP Clinical Pharmacist  Please check AMION for all [facility] Pharmacy phone numbers After 10:00 PM, call Main Pharmacy [phone]

## 2022-12-24 NOTE — Interval H&P Note (Signed)
History and Physical Interval Note:  01/06/2023 9:50 AM  Jeremy Patton  has presented today for surgery, with the diagnosis of stemi.  The various methods of treatment have been discussed with the patient and family. After consideration of risks, benefits and other options for treatment, the patient has consented to  Procedure(s): LEFT HEART CATH AND CORONARY ANGIOGRAPHY (N/A) as a surgical intervention.  The patient's history has been reviewed, patient examined, no change in status, stable for surgery.  I have reviewed the patient's chart and labs.  Questions were answered to the patient's satisfaction.     [name] J Javeon Macmurray

## 2022-12-24 NOTE — Progress Notes (Signed)
Called about worsening hematoma at right internal jugular PAC insertion site. Initially pt told me he had a mole or chronic cyst there. This appears as though he misunderstood the question. On f/u exam there is palpable soft tissue mass that extents past the marked boarder edges previously maked and there is palpable mass around and above the insertion site.   I instructed nursing staff to place weighted pressure dressing at insertion site.  Cbc was sent He is currently on IV heparin in addition to DAPT.  Have instructed nursing staff to reach out to cards re: what to do w/ heparin,.    Simonne Martinet ACNP-BC [facility] Pulmonary/Critical Care Pager # [phone] OR # [phone] if no answer

## 2022-12-24 NOTE — Progress Notes (Signed)
PHARMACY - ANTICOAGULATION CONSULT NOTE  Pharmacy Consult for heparin Indication: impella  Allergies  Allergen Reactions   Protamine Other (See Comments)    Severe hypotension    Patient Measurements: Height: 5\' 9"  (175.3 cm) Weight: 60.8 kg (134 lb 0.6 oz) IBW/kg (Calculated) : 70.7 Last weight 10/21/22 ~140lbs  Heparin Dosing Weight: ~63kg  Vital Signs: Temp: 99.5 F (37.5 C) (11/01 2300) Temp Source: Core (11/01 2000) BP: 90/78 (11/01 2300) Pulse Rate: 91 (11/01 2300)  Labs: Recent Labs    12/25/2022 1000 01/07/2023 1003 12/26/2022 1056 01/12/2023 1148 01/08/2023 1600 12/31/2022 1812 01/10/2023 2300  HGB 10.7* 11.6*   < > 12.9* 10.6* 10.1*  --   HCT 32.5* 34.0*   < > 38.0* 33.3* 31.1*  --   PLT 154  --   --   --  173 159  --   APTT 27  --   --   --   --   --   --   LABPROT 13.6  --   --   --   --   --   --   INR 1.0  --   --   --   --   --   --   HEPARINUNFRC  --   --   --   --   --   --  <0.10*  CREATININE 1.05 0.80  --   --   --   --   --   TROPONINIHS 703*  --   --   --   --   --   --    < > = values in this interval not displayed.    Estimated Creatinine Clearance: 59.1 mL/min (by C-G formula based on SCr of 0.8 mg/dL).   Medical History: Past Medical History:  Diagnosis Date   Arthritis    Carotid artery disease (HCC)    a. s/p R CEA in 04/2015 b. 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   Diabetes mellitus without complication (HCC) 11/29/2011   GERD (gastroesophageal reflux disease)    Heart murmur    High cholesterol    History of CHF (congestive heart failure) 07/01/2021   Hyperlipidemia 11/29/2011   Hypertension 11/29/2011   Low back pain 11/29/2011   Lumbar radiculopathy 11/29/2011   Left   Pain of left lower leg    Wears glasses       Assessment: 82 yo M with STEMI s/p PCI c/b intraoperative hypotension requiring impella placement 11/1. No anticoagulation prior to  admission. Pharmacy consulted for heparin.    ACT down to 135. Patient with hemolysis and urine that was dark red but is now clearing up to be light red/pink. Hgb down post procedure. No other signs of bleeding. Impella is running at P7, ~3L/min, D5W purge (no heparin) @ 14.21ml/hr. Patient received 12,000 units heparin in cath this morning.   11/1 PM update:  Heparin level undetectable Some hematoma around internal jugular PAC site  Goal of Therapy:  Heparin level 0.2 -0.5 units/ml Monitor platelets by anticoagulation protocol: Yes   Plan:  Inc heparin to 650 units/hr Heparin level in 8 hours Monitor for any worsening of hematoma  Abran Duke, PharmD, BCPS Clinical Pharmacist Phone: [phone]

## 2022-12-24 NOTE — Progress Notes (Signed)
82 year old male with hypertension, type II DM, h/o bilateral carotid endarterectomy, now with chest pain with anterior inferior ST elevation.  Discussed with the patient, benefits outweigh risk for urgent coronary angiography and intervention for STEMI.  Elder Negus, MD

## 2022-12-24 NOTE — TOC Benefit Eligibility Note (Signed)
Patient Product/process development scientist completed.    The patient is insured through [employer]. Patient has Medicare and is not eligible for a copay card, but may be able to apply for patient assistance, if available.    Ran test claim for Brilinta 90 mg and the current 30 day co-pay is $40.00.  Ran test claim for Jardance 10 mg and the current 30 day co-pay is $40.00.  This test claim was processed through [facility]- copay amounts may vary at other pharmacies due to pharmacy/plan contracts, or as the patient moves through the different stages of their insurance plan.     Roland Earl, CPHT Pharmacy Technician III Certified Patient Advocate [facility] Patient Advocate Team Direct Number: [phone]  Fax: [phone]

## 2022-12-24 NOTE — H&P (Addendum)
Cardiology Admission History and Physical   Patient ID: Jeremy Patton MRN: [id number]; DOB: February 15, 1941   Admission date: 01/12/2023  PCP:  Tommie Sams, DO   [facility] Providers Cardiologist:  Elder Negus, MD   {  Chief Complaint:  Chest pain   History of Present Illness:   Jeremy Patton 82 y.o. male with hypertension, type II DM, h/o left carotid endarterectomy, admitted with chest pain  Patient has been having off-and-on chest pain for last 2-3 days, but started having more intense pain this morning.  Patient was central, with mild shortness of breath.  He called EMS.  EKG showed anterior and inferior ST elevation.  Code STEMI was thus activated.  On presentation, patient was still having 2/10 chest pain.  He was hemodynamically stable on arrival.  He had an episode of vomiting in the field, right after he was given his aspirin.  He has had no further vomiting episodes.  He had no arrhythmias on route.   Given his presentation, he was recommended to undergo urgent coronary angiography and intervention.   Past Medical History:  Diagnosis Date   Arthritis    Carotid artery disease (HCC)    a. s/p R CEA in 04/2015 b. 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   Diabetes mellitus without complication (HCC) 11/29/2011   GERD (gastroesophageal reflux disease)    Heart murmur    High cholesterol    History of CHF (congestive heart failure) 07/01/2021   Hyperlipidemia 11/29/2011   Hypertension 11/29/2011   Low back pain 11/29/2011   Lumbar radiculopathy 11/29/2011   Left   Pain of left lower leg    Wears glasses     Past Surgical History:  Procedure Laterality Date   CATARACT EXTRACTION W/ INTRAOCULAR LENS  IMPLANT, BILATERAL     CIRCUMCISION     Age 62   COLONOSCOPY  08/22/2005 and  07/31/2009   minimal internal hemorrhoids, left-sided diverticulosis   COLONOSCOPY N/A  11/25/2014   Procedure: COLONOSCOPY;  Surgeon: Corbin Ade, MD;  [facility]: [facility];  Service: Endoscopy;  Laterality: N/A;  1030   ENDARTERECTOMY Right 05/06/2015   Procedure: Right Carotid ENDARTERECTOMY ;  Surgeon: Fransisco Hertz, MD;  Location: [facility] OR;  Service: Vascular;  Laterality: Right;   ENDARTERECTOMY Left 06/25/2015   Procedure: LEFT CAROTID ENDARTERECTOMY;  Surgeon: Fransisco Hertz, MD;  Location: [facility] OR;  Service: Vascular;  Laterality: Left;   ESOPHAGEAL DILATION N/A 11/25/2014   Procedure: ESOPHAGEAL DILATION;  Surgeon: Corbin Ade, MD;  Location: [facility];  Service: Endoscopy;  Laterality: N/A;   ESOPHAGOGASTRODUODENOSCOPY N/A 11/25/2014   Procedure: ESOPHAGOGASTRODUODENOSCOPY (EGD);  Surgeon: Corbin Ade, MD;  Location: [facility];  Service: Endoscopy;  Laterality: N/A;   EYE SURGERY     MULTIPLE TOOTH EXTRACTIONS     PATCH ANGIOPLASTY Right 05/06/2015   Procedure: PATCH ANGIOPLASTY;  Surgeon: Fransisco Hertz, MD;  Location: [facility] OR;  Service: Vascular;  Laterality: Right;   PATCH ANGIOPLASTY Left 06/25/2015   Procedure: PATCH ANGIOPLASTY USING XENOSURE BIOLOGIC PATCH 1cm X 6cm ;  Surgeon: Fransisco Hertz, MD;  Location: [facility] OR;  Service: Vascular;  Laterality: Left;   TONSILLECTOMY       Medications Prior to Admission: Prior to Admission medications   Medication Sig Start Date End Date Taking? Authorizing Provider  ACCU-CHEK GUIDE test strip  08/18/21  [provider]  Accu-Chek Softclix Lancets lancets  08/18/21   [provider]  acetaminophen (TYLENOL) 500 MG tablet Take 1,000 mg by mouth 2 (two) times daily.    [provider]  amLODipine (NORVASC) 10 MG tablet Take 1 tablet (10 mg total) by mouth daily. 05/10/22   Tommie Sams, DO  aspirin 81 MG tablet Take 81 mg by mouth daily.    [provider]  blood glucose meter kit and supplies KIT Dispense based on patient and insurance preference. Use up to four times daily as directed.  07/16/21   Tommie Sams, DO  cephALEXin (KEFLEX) 500 MG capsule Take 1 capsule (500 mg total) by mouth 2 (two) times daily. 11/28/22   Particia Nearing, PA-C  chlorhexidine (HIBICLENS) 4 % external liquid Apply topically daily as needed. 11/28/22   Particia Nearing, PA-C  clobetasol ointment (TEMOVATE) 0.05 % Apply 1 Application topically 2 (two) times daily. 09/07/22   Tommie Sams, DO  Continuous Glucose Sensor (DEXCOM G7 SENSOR) MISC Inject 1 Application into the skin as directed. Change sensor every 10 days as directed. 10/26/22   Dani Gobble, NP  LANTUS SOLOSTAR 100 UNIT/ML Solostar Pen INJECT 10 UNITS INTO THE SKIN AT BEDTIME. 11/23/22   Dani Gobble, NP  metFORMIN (GLUCOPHAGE) 1000 MG tablet Take 1 tablet (1,000 mg total) by mouth 2 (two) times daily with a meal. 04/22/22   Dani Gobble, NP  mupirocin ointment (BACTROBAN) 2 % Apply 1 Application topically 2 (two) times daily. 11/28/22   Particia Nearing, PA-C  olmesartan (BENICAR) 40 MG tablet Take 1 tablet (40 mg total) by mouth daily. 09/07/22   Tommie Sams, DO  pantoprazole (PROTONIX) 40 MG tablet TAKE (1) TABLET BY MOUTH ONCE DAILY. 12/09/22   Tommie Sams, DO  potassium chloride SA (KLOR-CON M) 20 MEQ tablet TAKE ONE TABLET BY MOUTH DAILY. 10/20/22   Tommie Sams, DO  rosuvastatin (CRESTOR) 20 MG tablet TAKE ONE TABLET BY MOUTH ONCE DAILY. 10/20/22   Tommie Sams, DO  sertraline (ZOLOFT) 50 MG tablet Take 1 tablet (50 mg total) by mouth daily. 05/10/22   Tommie Sams, DO  sitaGLIPtin (JANUVIA) 50 MG tablet Take 1 tablet (50 mg total) by mouth daily. 04/22/22   Dani Gobble, NP  tamsulosin (FLOMAX) 0.4 MG CAPS capsule TAKE (1) CAPSULE BY MOUTH ONCE DAILY. 10/05/22   Donnita Falls, FNP     Allergies:    Allergies  Allergen Reactions   Protamine     hypotension    Social History:   Social History   Socioeconomic History   Marital status: Married    Spouse name: Not on file   Number of  children: 4   Years of education: Not on file   Highest education level: Not on file  Occupational History   Occupation: retired  Tobacco Use   Smoking status: Former    Current packs/day: 0.00    Average packs/day: 2.0 packs/day for 30.0 years (60.0 ttl pk-yrs)    Types: Cigarettes    Start date: 12/16/1961    Quit date: 12/17/1991    Years since quitting: 31.0   Smokeless tobacco: Never  Vaping Use   Vaping status: Never Used  Substance and Sexual Activity   Alcohol use: Yes    Alcohol/week: 1.0 standard drink of alcohol    Types: 1 Standard drinks or equivalent per week   Drug use: Yes  Types: Marijuana   Sexual activity: Yes    Birth control/protection: None  Other Topics Concern   Not on file  Social History Narrative   Not on file   Social Determinants of Health   Financial Resource Strain: Low Risk  (12/10/2021)   Overall Financial Resource Strain (CARDIA)    Difficulty of Paying Living Expenses: Not hard at all  Food Insecurity: No Food Insecurity (12/10/2021)   Hunger Vital Sign    Worried About Running Out of Food in the Last Year: Never true    Ran Out of Food in the Last Year: Never true  Transportation Needs: No Transportation Needs (12/10/2021)   PRAPARE - Administrator, Civil Service (Medical): No    Lack of Transportation (Non-Medical): No  Physical Activity: Insufficiently Active (12/10/2021)   Exercise Vital Sign    Days of Exercise per Week: 3 days    Minutes of Exercise per Session: 30 min  Stress: No Stress Concern Present (12/10/2021)   [employer] of Occupational Health - Occupational Stress Questionnaire    Feeling of Stress : Not at all  Social Connections: Moderately Isolated (12/10/2021)   Social Connection and Isolation Panel [NHANES]    Frequency of Communication with Friends and Family: More than three times a week    Frequency of Social Gatherings with Friends and Family: Once a week    Attends Religious Services:  Never    Database administrator or Organizations: No    Attends Banker Meetings: Never    Marital Status: Married  Catering manager Violence: Not At Risk (12/10/2021)   Humiliation, Afraid, Rape, and Kick questionnaire    Fear of Current or Ex-Partner: No    Emotionally Abused: No    Physically Abused: No    Sexually Abused: No    Family History:   The patient's family history includes Colon cancer in his brother; Heart disease in his mother; Stroke in his mother.    ROS:  Please see the history of present illness.  All other ROS reviewed and negative.     Physical Exam/Data:   Vitals:   01/21/2023 1330 01/18/2023 1335 01/01/2023 1340 12/28/2022 1345  BP: (!) 124/97   (!) 145/119  Pulse: (!) 112 (!) 110 (!) 108 (!) 108  Resp: 20 (!) 22 (!) 22 (!) 21  Temp: 99.3 F (37.4 C) 99 F (37.2 C) 99.1 F (37.3 C) 99.3 F (37.4 C)  SpO2: 100% 99% 100% 99%   No intake or output data in the 24 hours ending 01/19/2023 1354    10/21/2022    2:46 PM 09/07/2022   11:23 AM 09/01/2022   10:57 AM  Last 3 Weights  Weight (lbs) 140 lb 3.2 oz 141 lb 3.2 oz 142 lb 9.6 oz  Weight (kg) 63.594 kg 64.048 kg 64.683 kg     Physical Exam Vitals and nursing note reviewed.  Constitutional:      General: He is not in acute distress. Neck:     Vascular: No JVD.  Cardiovascular:     Rate and Rhythm: Normal rate and regular rhythm.     Heart sounds: Normal heart sounds. No murmur heard. Pulmonary:     Effort: Pulmonary effort is normal.     Breath sounds: Normal breath sounds. No wheezing or rales.  Musculoskeletal:     Right lower leg: No edema.     Left lower leg: No edema.      EKG:  The ECG that  was done 01/10/2023 was personally reviewed and demonstrates  Sinus rhythm Anterior and inferior STEMI    Relevant CV Studies: Left carotid endarterectomy 06/2015  Laboratory Data:  High Sensitivity Troponin:   Recent Labs  Lab 01/05/2023 1000  TROPONINIHS 703*       Chemistry Recent Labs  Lab 01/01/2023 1000 01/09/2023 1003 01/17/2023 1056 01/13/2023 1114 01/09/2023 1139  NA 135 138   < > 121* 136  K 3.7 3.8   < > 3.6 4.1  CL 106 107  --   --   --   CO2 16*  --   --   --   --   GLUCOSE 286* 284*  --   --   --   BUN 18 17  --   --   --   CREATININE 1.05 0.80  --   --   --   CALCIUM 8.6*  --   --   --   --   GFRNONAA >60  --   --   --   --   ANIONGAP 13  --   --   --   --    < > = values in this interval not displayed.    Recent Labs  Lab 01/02/2023 1000  PROT 6.1*  ALBUMIN 3.2*  AST 25  ALT 21  ALKPHOS 46  BILITOT 0.7   Lipids  Recent Labs  Lab 01/13/2023 1000  CHOL 143  TRIG 72  HDL 70  LDLCALC 59  CHOLHDL 2.0   Hematology Recent Labs  Lab 01/02/2023 1000 01/06/2023 1003 01/16/2023 1114 01/20/2023 1139  WBC 7.2  --   --   --   RBC 3.83*  --   --   --   HGB 10.7*   < > 13.6 12.9*  HCT 32.5*   < > 40.0 38.0*  MCV 84.9  --   --   --   MCH 27.9  --   --   --   MCHC 32.9  --   --   --   RDW 16.4*  --   --   --   PLT 154  --   --   --    < > = values in this interval not displayed.   Thyroid No results for input(s): "TSH", "FREET4" in the last 168 hours. BNPNo results for input(s): "BNP", "PROBNP" in the last 168 hours.  DDimer No results for input(s): "DDIMER" in the last 168 hours.   Radiology/Studies:  No results found.   Assessment and Plan:   82 y.o. male with hypertension, type II DM, h/o left carotid endarterectomy, admitted with chest pain  STEMI: Anterior STEMI, also with inferior ST elevation. Suspect occlusion of wrap around LAD. Discussed risks, benefits. Will proceed with urgent coronary angiography and intervention  Type 2 DM: Will manage with insulin  Risk Assessment/Risk Scores:  TIMI Risk Score for ST  Elevation MI:   The patient's TIMI risk score is 11, which indicates a 35.9% risk of all cause mortality at 30 days.   New York Heart Association (NYHA) Functional Class NYHA Class III    Code  Status: Full Code  Severity of Illness: The appropriate patient status for this patient is INPATIENT. Inpatient status is judged to be reasonable and necessary in order to provide the required intensity of service to ensure the patient's safety. The patient's presenting symptoms, physical exam findings, and initial radiographic and laboratory data in the context of their chronic comorbidities is felt  to place them at high risk for further clinical deterioration. Furthermore, it is not anticipated that the patient will be medically stable for discharge from the hospital within 2 midnights of admission.   * I certify that at the point of admission it is my clinical judgment that the patient will require inpatient hospital care spanning beyond 2 midnights from the point of admission due to high intensity of service, high risk for further deterioration and high frequency of surveillance required.*   For questions or updates, please contact [facility] Please consult [email or web address] for contact info under     Signed, Elder Negus, MD 01/07/2023 1:54 PM

## 2022-12-24 NOTE — Consult Note (Signed)
Advanced Heart Failure Team Consult Note   Primary Physician: Tommie Sams, DO PCP-Cardiologist:  Elder Negus, MD  Reason for Consultation: STEMI>Cardiogenic shock  HPI:    Jeremy Patton is seen today for evaluation of STEMI>Cardiogenic shock at the request of Dr. Rosemary Holms.   Jeremy Patton is a 82 y.o. male with hx diastolic heart failure, HTN, DMII, BPH, HLD, and h/o bilateral carotid endarterectomy.  Jeremy Patton presented to the ED with sharp midsternal chest pain.  He reports chest pain that would come and go for a couple of months.  For the last 2 to 3 days he had persistent chest pain that would intermittently improve with rest.  This morning he had chest pain that would not go away so EMS was called.  EMS EKG showed anterior and inferior ST elevation, code STEMI called.  Transported straight to the Cath Lab on arrival, LHC with proximal/mid LAD disease (100% mid LAD occlusion), now s/p overlap DES to mid LAD.  Patient in cardiogenic shock, Impella placed for mechanical support.  Patient required NRB 2/2 saturations while on cath table, PCCM consulted.  Procedure complicated by small right groin hematoma, FemStop placed.  Required NE. Admission labs reviewed: LA 2.2>1.7, HsTrop 706, K3.7, SCR 1.05, AST/ALT WNL, LDL 59, Hgb 10.7, HgbA1c 8.2.   Transported back to CVICU.  Resting comfortably in bed with BiPAP.  Denies further chest pain.  Impella at P9. On 4 of epi. Jeremy Patton: PA unavailable, CO/CI 4.5/2.5   Home Medications Prior to Admission medications   Medication Sig Start Date End Date Taking? Authorizing Provider  ACCU-CHEK GUIDE test strip  08/18/21   [provider]  Accu-Chek Softclix Lancets lancets  08/18/21   [provider]  acetaminophen (TYLENOL) 500 MG tablet Take 1,000 mg by mouth 2 (two) times daily.    [provider]  amLODipine (NORVASC) 10 MG tablet Take 1 tablet (10 mg total) by mouth daily. 05/10/22   Tommie Sams, DO  aspirin 81 MG tablet Take 81 mg by mouth daily.    [provider]  blood glucose meter kit and supplies KIT Dispense based on patient and insurance preference. Use up to four times daily as directed. 07/16/21   Tommie Sams, DO  cephALEXin (KEFLEX) 500 MG capsule Take 1 capsule (500 mg total) by mouth 2 (two) times daily. 11/28/22   Particia Nearing, PA-C  chlorhexidine (HIBICLENS) 4 % external liquid Apply topically daily as needed. 11/28/22   Particia Nearing, PA-C  clobetasol ointment (TEMOVATE) 0.05 % Apply 1 Application topically 2 (two) times daily. 09/07/22   Tommie Sams, DO  Continuous Glucose Sensor (DEXCOM G7 SENSOR) MISC Inject 1 Application into the skin as directed. Change sensor every 10 days as directed. 10/26/22   Dani Gobble, NP  LANTUS SOLOSTAR 100 UNIT/ML Solostar Pen INJECT 10 UNITS INTO THE SKIN AT BEDTIME. 11/23/22   Dani Gobble, NP  metFORMIN (GLUCOPHAGE) 1000 MG tablet Take 1 tablet (1,000 mg total) by mouth 2 (two) times daily with a meal. 04/22/22   Dani Gobble, NP  mupirocin ointment (BACTROBAN) 2 % Apply 1 Application topically 2 (two) times daily. 11/28/22   Particia Nearing, PA-C  olmesartan (BENICAR) 40 MG tablet Take 1 tablet (40 mg total) by mouth daily. 09/07/22   Tommie Sams, DO  pantoprazole (PROTONIX) 40 MG tablet TAKE (1) TABLET BY MOUTH ONCE DAILY. 12/09/22   Tommie Sams, DO  potassium chloride SA (KLOR-CON M) 20 MEQ tablet TAKE ONE TABLET BY MOUTH DAILY. 10/20/22   Tommie Sams, DO  rosuvastatin (CRESTOR) 20 MG tablet TAKE ONE TABLET BY MOUTH ONCE DAILY. 10/20/22   Tommie Sams, DO  sertraline (ZOLOFT) 50 MG tablet Take 1 tablet (50 mg total) by mouth daily. 05/10/22   Tommie Sams, DO  sitaGLIPtin (JANUVIA) 50 MG tablet Take 1 tablet (50 mg total) by mouth daily. 04/22/22   Dani Gobble, NP  tamsulosin (FLOMAX) 0.4 MG CAPS capsule TAKE (1) CAPSULE BY MOUTH ONCE DAILY. 10/05/22   Donnita Falls,  FNP    Past Medical History: Past Medical History:  Diagnosis Date   Arthritis    Carotid artery disease (HCC)    a. s/p R CEA in 04/2015 b. 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   Diabetes mellitus without complication (HCC) 11/29/2011   GERD (gastroesophageal reflux disease)    Heart murmur    High cholesterol    History of CHF (congestive heart failure) 07/01/2021   Hyperlipidemia 11/29/2011   Hypertension 11/29/2011   Low back pain 11/29/2011   Lumbar radiculopathy 11/29/2011   Left   Pain of left lower leg    Wears glasses     Past Surgical History: Past Surgical History:  Procedure Laterality Date   CATARACT EXTRACTION W/ INTRAOCULAR LENS  IMPLANT, BILATERAL     CIRCUMCISION     Age 21   COLONOSCOPY  08/22/2005 and  07/31/2009   minimal internal hemorrhoids, left-sided diverticulosis   COLONOSCOPY N/A 11/25/2014   Procedure: COLONOSCOPY;  Surgeon: Corbin Ade, MD;  [facility];  Service: Endoscopy;  Laterality: N/A;  1030   ENDARTERECTOMY Right 05/06/2015   Procedure: Right Carotid ENDARTERECTOMY ;  Surgeon: Fransisco Hertz, MD;  Location: [facility] OR;  Service: Vascular;  Laterality: Right;   ENDARTERECTOMY Left 06/25/2015   Procedure: LEFT CAROTID ENDARTERECTOMY;  Surgeon: Fransisco Hertz, MD;  Location: [facility] OR;  Service: Vascular;  Laterality: Left;   ESOPHAGEAL DILATION N/A 11/25/2014   Procedure: ESOPHAGEAL DILATION;  Surgeon: Corbin Ade, MD;  [facility];  Service: Endoscopy;  Laterality: N/A;   ESOPHAGOGASTRODUODENOSCOPY N/A 11/25/2014   Procedure: ESOPHAGOGASTRODUODENOSCOPY (EGD);  Surgeon: Corbin Ade, MD;  [facility];  Service: Endoscopy;  Laterality: N/A;   EYE SURGERY     MULTIPLE TOOTH EXTRACTIONS     PATCH ANGIOPLASTY Right 05/06/2015   Procedure: PATCH ANGIOPLASTY;  Surgeon: Fransisco Hertz, MD;  Location: [facility] OR;  Service: Vascular;  Laterality: Right;    PATCH ANGIOPLASTY Left 06/25/2015   Procedure: PATCH ANGIOPLASTY USING XENOSURE BIOLOGIC PATCH 1cm X 6cm ;  Surgeon: Fransisco Hertz, MD;  Location: [facility] OR;  Service: Vascular;  Laterality: Left;   TONSILLECTOMY      Family History: Family History  Problem Relation Age of Onset   Stroke Mother    Heart disease Mother        before age 17   Colon cancer Brother        Age 53    Social History: Social History   Socioeconomic History   Marital status: Married    Spouse name: Not on file   Number of children: 4   Years of education: Not on file   Highest education level: Not on file  Occupational History   Occupation: retired  Tobacco Use  Smoking status: Former    Current packs/day: 0.00    Average packs/day: 2.0 packs/day for 30.0 years (60.0 ttl pk-yrs)    Types: Cigarettes    Start date: 12/16/1961    Quit date: 12/17/1991    Years since quitting: 31.0   Smokeless tobacco: Never  Vaping Use   Vaping status: Never Used  Substance and Sexual Activity   Alcohol use: Yes    Alcohol/week: 1.0 standard drink of alcohol    Types: 1 Standard drinks or equivalent per week   Drug use: Yes    Types: Marijuana   Sexual activity: Yes    Birth control/protection: None  Other Topics Concern   Not on file  Social History Narrative   Not on file   Social Determinants of Health   Financial Resource Strain: Low Risk  (12/10/2021)   Overall Financial Resource Strain (CARDIA)    Difficulty of Paying Living Expenses: Not hard at all  Food Insecurity: No Food Insecurity (12/10/2021)   Hunger Vital Sign    Worried About Running Out of Food in the Last Year: Never true    Ran Out of Food in the Last Year: Never true  Transportation Needs: No Transportation Needs (12/10/2021)   PRAPARE - Administrator, Civil Service (Medical): No    Lack of Transportation (Non-Medical): No  Physical Activity: Insufficiently Active (12/10/2021)   Exercise Vital Sign    Days of Exercise  per Week: 3 days    Minutes of Exercise per Session: 30 min  Stress: No Stress Concern Present (12/10/2021)   [employer] of Occupational Health - Occupational Stress Questionnaire    Feeling of Stress : Not at all  Social Connections: Moderately Isolated (12/10/2021)   Social Connection and Isolation Panel [NHANES]    Frequency of Communication with Friends and Family: More than three times a week    Frequency of Social Gatherings with Friends and Family: Once a week    Attends Religious Services: Never    Database administrator or Organizations: No    Attends Banker Meetings: Never    Marital Status: Married    Allergies:  Allergies  Allergen Reactions   Protamine     hypotension    Objective:    Vital Signs:   Temp:  [99 F (37.2 C)-99.7 F (37.6 C)] 99.3 F (37.4 C) (11/01 1345) Pulse Rate:  [0-112] 108 (11/01 1345) Resp:  [14-37] 21 (11/01 1345) BP: (83-154)/(53-143) 145/119 (11/01 1345) SpO2:  [75 %-100 %] 99 % (11/01 1345) FiO2 (%):  [60 %] 60 % (11/01 1329)    Weight change: There were no vitals filed for this visit.  Intake/Output:  No intake or output data in the 24 hours ending 01/17/2023 1401    Physical Exam   General:  elderly appearing.   HEENT: +bipap Neck: supple. JVD ~7 cm. Carotids 2+ bilat; no bruits. No lymphadenopathy or thyromegaly appreciated. Cor: PMI nondisplaced. Tachy rate & regular rhythm. No rubs, gallops or murmurs. Lungs: clear Abdomen: soft, nontender, nondistended. No hepatosplenomegaly. No bruits or masses. Good bowel sounds. Extremities: no cyanosis, clubbing, rash, edema. R groin with femstop in place. IABP R groin Neuro: alert & oriented x 3, cranial nerves grossly intact. moves all 4 extremities w/o difficulty. Affect pleasant.   Telemetry   ST low 100s (Personally reviewed)    EKG    ST 110   Labs   Basic Metabolic Panel: Recent Labs  Lab 01/21/2023 1000 01/03/2023 1003  01/03/2023 1056  01/14/2023 1114 01/03/2023 1139  NA 135 138 124* 121* 136  K 3.7 3.8 3.5 3.6 4.1  CL 106 107  --   --   --   CO2 16*  --   --   --   --   GLUCOSE 286* 284*  --   --   --   BUN 18 17  --   --   --   CREATININE 1.05 0.80  --   --   --   CALCIUM 8.6*  --   --   --   --     Liver Function Tests: Recent Labs  Lab 12/27/2022 1000  AST 25  ALT 21  ALKPHOS 46  BILITOT 0.7  PROT 6.1*  ALBUMIN 3.2*   No results for input(s): "LIPASE", "AMYLASE" in the last 168 hours. No results for input(s): "AMMONIA" in the last 168 hours.  CBC: Recent Labs  Lab 01/19/2023 1000 01/06/2023 1003 01/17/2023 1056 01/17/2023 1114 01/12/2023 1139  WBC 7.2  --   --   --   --   NEUTROABS 5.4  --   --   --   --   HGB 10.7* 11.6* 12.2* 13.6 12.9*  HCT 32.5* 34.0* 36.0* 40.0 38.0*  MCV 84.9  --   --   --   --   PLT 154  --   --   --   --     Cardiac Enzymes: No results for input(s): "CKTOTAL", "CKMB", "CKMBINDEX", "TROPONINI" in the last 168 hours.  BNP: BNP (last 3 results) No results for input(s): "BNP" in the last 8760 hours.  ProBNP (last 3 results) No results for input(s): "PROBNP" in the last 8760 hours.   CBG: Recent Labs  Lab 01/22/2023 1302  GLUCAP 318*    Coagulation Studies: Recent Labs    01/17/2023 1000  LABPROT 13.6  INR 1.0     Imaging   No results found.   Medications:     Current Medications:  EPINEPHrine NaCl       insulin aspart  0-15 Units Subcutaneous TID WC   insulin glargine-yfgn  10 Units Subcutaneous QHS   pantoprazole  40 mg Oral Daily   rosuvastatin  20 mg Oral Daily   sertraline  50 mg Oral Daily   sodium chloride flush  3 mL Intravenous Q12H   tamsulosin  0.4 mg Oral QPC supper   ticagrelor  90 mg Oral BID    Infusions:  sodium chloride     sodium chloride     epinephrine     norepinephrine (LEVOPHED) Adult infusion        Patient Profile   Jeremy Patton is a 82 y.o. male with hx diastolic heart failure, HTN, DMII, BPH, HLD, and h/o  bilateral carotid endarterectomy. Presented with STEMI, AHF team to see for MCS management.   Assessment/Plan   Anterior STEMI>>Cardiogenic shock - History of diastolic HF. Echo 4/18 EF 55-60%, abnormal diastolic function, mild-mod MR, mild-mod calcified annulus - Presented with STEMI. LHC: LM: Normal. LAD: 80% calcific proximal/mid LAD disease, Mid 100% occlusion (culprit lesion) Distal apical 80% stenosis, Left to left, and left to right collaterals from LAD to LCx and RCA respectively. Lcx: Proximal 100% occlusion (likely chronic). RCA: Mid 100% occlusion (likely chronic), s/p mid LAD overlap DES - RHC today RA 7, Fick CO/CI 4.84/ 2.7, PW 27. PA 41/19 (30) - Impella CP placed for hemodynamic support with CGS, currently on P9. - Volume mildly elevated,  got 40 mg IV lasix in lab, plan for another 40mg   - Continue NE, wean for MAP greater than 65. Follow Co-ox - GDMT limited by MCS and CGS - Update echo - Continue Brilinta and statin - Renal function stable, avoid hypotension - No further chest pain  Acute respiratory failure - required NRB in lab 2/2 desaturations, now stable on bipap - PCCM following  L groin hematoma - post-procedure. Femstop in place - Continue to monitor, follow CBC  HTN -BP soft, required NE during procedure -Continue NE for MAP >65  DMII -Per primary -A1c 8.2 -Continue SSI  Length of Stay: 0  Alen Bleacher, NP  01/14/2023, 2:01 PM  Advanced Heart Failure Team Pager [phone] (M-F; 7a - 5p)  Please contact [facility] for night-coverage after hours (4p -7a ) and weekends on [email or web address]

## 2022-12-24 NOTE — H&P (View-Only) (Signed)
82 year old male with hypertension, type II DM, h/o bilateral carotid endarterectomy, now with chest pain with anterior inferior ST elevation.  Discussed with the patient, benefits outweigh risk for urgent coronary angiography and intervention for STEMI.  Elder Negus, MD

## 2022-12-24 NOTE — Progress Notes (Signed)
RT x2 attempted to place/maintain arterial line but was unsuccessfully. RN aware.

## 2022-12-24 NOTE — TOC Initial Note (Signed)
Transition of Care [facility]) - Initial/Assessment Note    Patient Details  Name: Jeremy Patton MRN: [id number] Date of Birth: 05/17/1940  [facility] CM/SW Contact:    Elliot Cousin, RN Phone Number:[phone] 12/26/2022, 6:06 PM  Clinical Narrative:   CM spoke to pt's dtr. Pt is independent at home with spouse. Has a cane at home and scale. Will continue to follow for dc needs.                Expected Discharge Plan: Home/Self Care Barriers to Discharge: Continued Medical Work up   Patient Goals and CMS Choice            Expected Discharge Plan and Services   Discharge Planning Services: CM Consult   Living arrangements for the past 2 months: Single Family Home                                      Prior Living Arrangements/Services Living arrangements for the past 2 months: Single Family Home Lives with:: Spouse   Do you feel safe going back to the place where you live?: Yes          Current home services: DME (stair lift, cane)    Activities of Daily Living      Permission Sought/Granted Permission sought to share information with : Case Manager, Family Supports, PCP Permission granted to share information with : Yes, Verbal Permission Granted              Emotional Assessment           Psych Involvement: No (comment)  Admission diagnosis:  STEMI (ST elevation myocardial infarction) (HCC) [I21.3] Patient Active Problem List   Diagnosis Date Noted   STEMI (ST elevation myocardial infarction) (HCC) 01/21/2023   Cardiogenic shock (HCC) 12/27/2022   Benign prostatic hyperplasia with weak urinary stream 10/05/2022   Alzheimer's disease (HCC) 09/08/2022   Rash 09/08/2022   Mood disturbance 05/10/2022   Chronic low back pain without sciatica 07/01/2021   Acute hypoxic respiratory failure (HCC) 06/08/2016   Carotid artery disease (HCC) 05/01/2015   Dizziness 05/01/2015   Essential hypertension 05/01/2015    Hyperlipidemia LDL goal <70 05/01/2015   Type 2 diabetes mellitus with circulatory disorder, with long-term current use of insulin (HCC) 05/01/2015   GERD (gastroesophageal reflux disease) 11/01/2014   PCP:  Tommie Sams, DO Pharmacy:   [facility] - [place], [place] - [address] [place] [address] Phone: [phone] Fax: [phone]  [facility] - [place], [place] - [address] [address] [place] [address] [address] [place] [address] [place] [address] Phone: [phone] Fax: [phone]     Social Determinants of Health (SDOH) Social History: SDOH Screenings   Food Insecurity: No Food Insecurity (12/10/2021)  Housing: Low Risk  (12/10/2021)  Transportation Needs: No Transportation Needs (12/10/2021)  Utilities: Not At Risk (12/10/2021)  Alcohol Screen: Low Risk  (12/10/2021)  Depression (PHQ2-9): Low Risk  (10/21/2022)  Financial Resource Strain: Low Risk  (12/10/2021)  Physical Activity: Insufficiently Active (12/10/2021)  Social Connections: Moderately Isolated (12/10/2021)  Stress: No Stress Concern Present (12/10/2021)  Tobacco Use: Medium Risk (11/28/2022)   SDOH Interventions:     Readmission Risk Interventions     No data to display

## 2022-12-24 NOTE — CV Procedure (Addendum)
Anterior/inferior STEMI, cardiogenic shock LM: Normal LAD: 80% calcific proximal/mid LAD disease          Mid 100% occlusion (culprit lesion)          Distal apical 80% stenosis          Left to left, and left to right collaterals from LAD to LCx and RCA respectively Lcx: Proximal 100% occlusion (likely chronic) RCA: Mid 100% occlusion (likely chronic)  LVEDP 26 mmHg  Impella guided PCI to mid LAD with overlapping stents 2.5 x 20 mm Synergy, and 2.75 x 12 mm Synergy DES Right heart catheter placed through right IJ for invasive hemodynamic monitoring  Small hematoma noted at right groin and right neck site. Femostop placed   Patient required nonrebreather mask throughout the case, with improvement oxygen saturations  Currently, hemodynamically stable with normal oxygen saturation  Patient was admitted to ICU  Full report to follow  Elder Negus, MD

## 2022-12-24 NOTE — Progress Notes (Signed)
  Echocardiogram 2D Echocardiogram has been performed.  Delcie Roch 12/27/2022, 2:14 PM

## 2022-12-25 ENCOUNTER — Inpatient Hospital Stay (HOSPITAL_COMMUNITY): Payer: Medicare PPO

## 2022-12-25 DIAGNOSIS — J9601 Acute respiratory failure with hypoxia: Secondary | ICD-10-CM | POA: Diagnosis not present

## 2022-12-25 DIAGNOSIS — I2102 ST elevation (STEMI) myocardial infarction involving left anterior descending coronary artery: Principal | ICD-10-CM

## 2022-12-25 DIAGNOSIS — R570 Cardiogenic shock: Secondary | ICD-10-CM | POA: Diagnosis not present

## 2022-12-25 LAB — GLUCOSE, CAPILLARY
Glucose-Capillary: 275 mg/dL — ABNORMAL HIGH (ref 70–99)
Glucose-Capillary: 289 mg/dL — ABNORMAL HIGH (ref 70–99)
Glucose-Capillary: 299 mg/dL — ABNORMAL HIGH (ref 70–99)
Glucose-Capillary: 290 mg/dL — ABNORMAL HIGH (ref 70–99)
Glucose-Capillary: 278 mg/dL — ABNORMAL HIGH (ref 70–99)

## 2022-12-25 LAB — BASIC METABOLIC PANEL
Anion gap: 12 (ref 5–15)
BUN: 29 mg/dL — ABNORMAL HIGH (ref 8–23)
CO2: 20 mmol/L — ABNORMAL LOW (ref 22–32)
Calcium: 8.2 mg/dL — ABNORMAL LOW (ref 8.9–10.3)
Chloride: 102 mmol/L (ref 98–111)
Creatinine, Ser: 1.34 mg/dL — ABNORMAL HIGH (ref 0.61–1.24)
GFR, Estimated: 52 mL/min — ABNORMAL LOW (ref >60)
Glucose, Bld: 291 mg/dL — ABNORMAL HIGH (ref 70–99)
Potassium: 3.7 mmol/L (ref 3.5–5.1)
Sodium: 134 mmol/L — ABNORMAL LOW (ref 135–145)

## 2022-12-25 LAB — CBC
HCT: 28.2 % — ABNORMAL LOW (ref 39.0–52.0)
Hemoglobin: 9.3 g/dL — ABNORMAL LOW (ref 13.0–17.0)
MCH: 27.8 pg (ref 26.0–34.0)
MCHC: 33 g/dL (ref 30.0–36.0)
MCV: 84.2 fL (ref 80.0–100.0)
Platelets: 122 10*3/uL — ABNORMAL LOW (ref 150–400)
RBC: 3.35 MIL/uL — ABNORMAL LOW (ref 4.22–5.81)
RDW: 16.2 % — ABNORMAL HIGH (ref 11.5–15.5)
WBC: 8.4 10*3/uL (ref 4.0–10.5)
nRBC: 0 % (ref 0.0–0.2)

## 2022-12-25 LAB — MAGNESIUM: Magnesium: 1.7 mg/dL (ref 1.7–2.4)

## 2022-12-25 LAB — COOXEMETRY PANEL
Carboxyhemoglobin: 1.9 % — ABNORMAL HIGH (ref 0.5–1.5)
Carboxyhemoglobin: 2.3 % — ABNORMAL HIGH (ref 0.5–1.5)
Methemoglobin: 0.7 % (ref 0.0–1.5)
Methemoglobin: 1.2 % (ref 0.0–1.5)
O2 Saturation: 57.4 %
O2 Saturation: 66.3 %
Total hemoglobin: 9.9 g/dL — ABNORMAL LOW (ref 12.0–16.0)
Total hemoglobin: 9.1 g/dL — ABNORMAL LOW (ref 12.0–16.0)

## 2022-12-25 LAB — HEPARIN LEVEL (UNFRACTIONATED)
Heparin Unfractionated: <0.1 [IU]/mL — ABNORMAL LOW (ref 0.30–0.70)
Heparin Unfractionated: 0.15 [IU]/mL — ABNORMAL LOW (ref 0.30–0.70)

## 2022-12-25 MED ORDER — DEXTROSE 5 % SOLN FOR IMPELLA PURGE CATHETER: INTRAVENOUS | Status: DC

## 2022-12-25 MED ORDER — INSULIN GLARGINE-YFGN 100 UNIT/ML ~~LOC~~ SOLN
14.0000 [IU] | Freq: Two times a day (BID) | SUBCUTANEOUS | Status: DC
  Administered 2022-12-25 – 2022-12-29 (×8): 14 [IU] via SUBCUTANEOUS
  Filled 2022-12-25 (×10): qty 0.14

## 2022-12-25 MED ORDER — MAGNESIUM SULFATE 2 GM/50ML IV SOLN
2.0000 g | Freq: Once | INTRAVENOUS | Status: AC
  Administered 2022-12-25: 2 g via INTRAVENOUS
  Filled 2022-12-25: qty 50

## 2022-12-25 MED ORDER — SODIUM BICARBONATE 8.4 % IV SOLN
INTRAVENOUS | Status: DC
  Filled 2022-12-25 (×2): qty 25

## 2022-12-25 MED ORDER — NOREPINEPHRINE 4 MG/250ML-% IV SOLN
INTRAVENOUS | Status: AC
  Administered 2022-12-25: 4 mg
  Filled 2022-12-25: qty 250

## 2022-12-25 MED ORDER — SODIUM CHLORIDE 0.9 % IV SOLN
25.0000 mg | Freq: Once | INTRAVENOUS | Status: AC
  Administered 2022-12-25: 25 mg via INTRAVENOUS
  Filled 2022-12-25: qty 1

## 2022-12-25 MED ORDER — DIGOXIN 125 MCG PO TABS
0.0625 mg | ORAL_TABLET | Freq: Every day | ORAL | Status: DC
  Administered 2022-12-25 – 2022-12-29 (×5): 0.0625 mg via ORAL
  Filled 2022-12-25 (×5): qty 1

## 2022-12-25 MED ORDER — NOREPINEPHRINE 4 MG/250ML-% IV SOLN
0.0000 ug/min | INTRAVENOUS | Status: DC
  Administered 2022-12-25: 5 ug/min via INTRAVENOUS
  Filled 2022-12-25: qty 250

## 2022-12-25 MED ORDER — SODIUM CHLORIDE 0.9 % IV SOLN: Freq: Once | INTRAVENOUS | Status: AC

## 2022-12-25 MED ORDER — LIDOCAINE-PRILOCAINE 2.5-2.5 % EX CREA
TOPICAL_CREAM | Freq: Four times a day (QID) | CUTANEOUS | Status: DC | PRN
  Filled 2022-12-25 (×2): qty 5

## 2022-12-25 MED ORDER — MAGNESIUM SULFATE 2 GM/50ML IV SOLN
2.0000 g | Freq: Once | INTRAVENOUS | Status: DC
Start: 2022-12-25 — End: 2022-12-25

## 2022-12-25 MED ORDER — POTASSIUM CHLORIDE 10 MEQ/50ML IV SOLN
10.0000 meq | INTRAVENOUS | Status: AC
  Administered 2022-12-25 (×4): 10 meq via INTRAVENOUS
  Filled 2022-12-25 (×4): qty 50

## 2022-12-25 MED ORDER — DEXTROSE 5 % SOLN FOR IMPELLA PURGE CATHETER
INTRAVENOUS | Status: DC
  Filled 2022-12-25: qty 1000

## 2022-12-25 MED ORDER — MELATONIN 3 MG PO TABS
3.0000 mg | ORAL_TABLET | Freq: Every day | ORAL | Status: DC
  Administered 2022-12-25 – 2022-12-28 (×4): 3 mg via ORAL
  Filled 2022-12-25 (×4): qty 1

## 2022-12-25 MED ORDER — SODIUM CHLORIDE 0.9% IV SOLUTION: INTRAVENOUS | Status: DC | PRN

## 2022-12-25 NOTE — Consult Note (Signed)
NAME:  Jeremy Patton, MRN:  [id number], DOB:  [birth year], LOS: 1 ADMISSION DATE:  12/25/2022, CONSULTATION DATE:  11/1 REFERRING MD:  Rosemary Holms, CHIEF COMPLAINT:  acute hypoxic resp failure    History of Present Illness:  This is a 82 year old male who presented to the ED w/ acute onset of chest pain and EKG c/w anterior/inferior STEMI. He was brought emergently to the cardiac cath lab while in cardiogenic shock. Underwent emergent left heart cath. This showed: 80% calcified prox and 80% mid occlusion of LAD (felt culprit vessel), distal apical was 80% diseased. The RCA and Lcx both 100% occluded felt chronic.  He underwent PCI w/ overlap DES  of LAD Impella placed. Right PAC floated. Right groin hematoma noted post-procedure. Pressure from fem stop currently being applied.  Desaturated to 70s while in cath lab. Placed on NRB On arrival to ICU hemodynamics improving some. Norepi needs improved w/ Impella mechanica assist and current SBP in 130s. Sats still 87-88% on NRB so critical care asked to see. He was placed in reverse trendelenburg to avoid malposition of Impella and placed on NIPPV.  Pertinent  Medical History  Hypertension, type II diabetes, BPH, chronic low back pain, prior bilateral carotid endarterectomy, HLD, GERD, former smoker (stopped in 31) Significant Hospital Events: Including procedures, antibiotic start and stop dates in addition to other pertinent events   11/1 admitted w/ STEMI ant/inferior. Presented in cardiogenic shock. Went directly to cath lab. Impella placed. Left heart cath showing severely diseased LAD (the mid LAD felt culprit lesion) underwent PVI w/ overlapping DES to the LAD. Right heart cath placed. Desaturated during cath lab. Remained hypoxic on NRB after arrival to ICU. Did get lasix during the cardiac cath. PCCM asked to see for hypoxia. At same time advanced HF team consulting for cardiogenic shock   Interim History / Subjective:  Denies chest  pain Stated breathing is much better today Denies cough Remain afebrile  Objective   Blood pressure 108/82, pulse 97, temperature 99.1 F (37.3 C), resp. rate 15, height 5\' 9"  (1.753 m), weight 60.8 kg, SpO2 98%. PAP: (9-44)/(3-30) 22/15 CVP:  [0 mmHg-8 mmHg] 1 mmHg PCWP:  [7 mmHg] 7 mmHg CO:  [3.5 L/min-5.1 L/min] 5.1 L/min CI:  [1.98 L/min/m2-2.87 L/min/m2] 2.87 L/min/m2  Vent Mode: CPAP FiO2 (%):  [60 %] 60 % PEEP:  [10 cmH20] 10 cmH20   Intake/Output Summary (Last 24 hours) at 12/25/2022 1052 Last data filed at 12/25/2022 1024 Gross per 24 hour  Intake 905.9 ml  Output 2114 ml  Net -1208.1 ml   Filed Weights   12/26/2022 2000  Weight: 60.8 kg    Examination: General: Elderly male, lying on the bed HEENT: [place]/AT, eyes anicteric.  moist mucus membranes Neuro: Alert, awake following commands Chest: Coarse breath sounds, no wheezes or rhonchi Heart: Regular rate and rhythm, no murmurs or gallops Abdomen: Soft, nontender, nondistended, bowel sounds present Skin: No rash Extremities: Impella is in place, currently at [facility]    Resolved Hospital Problem list     Assessment & Plan:  Acute biventricular HFrEF with cardiogenic shock Acute anterior/inferior STEMI status post DES to LAD Advanced heart failure team is following Continue milrinone Patient CVP is 4, will hold off diuretics Continue aspirin, Brilinta and IV heparin infusion Continue statin Impella is at P7 Holding GDMT in the setting of shock Monitor Coox Echo showing EF less than 20% with right ventricular dysfunction   Acute hypoxic respiratory failure 2/2 Pulmonary edema from cardiogenic shock  Patient is on 2 L nasal cannula oxygen now BiPAP was taken off Titrate oxygen with O2 sat goal 92%  Acute kidney injury BPH Serum creatinine trended up likely due to cardiorenal syndrome CVP is 4 Monitor intake and output Avoid nephrotoxic agent Continue Flomax  DM type II (insulin dependent) w/  hyperglycemia  Blood sugars are not well-controlled Increase Semglee to 14 units twice daily Monitor fingerstick goal 140-180  Anemia with right femoral puncture site hematoma also right side of the base of the neck hematoma  H&H dropped to 9.3 from 10.1 Neck hematoma size has decreased Monitor H&H Transfuse if hemoglobin less than 8  GERD Continue Protonix   Best Practice (right click and "Reselect all SmartList Selections" daily)   Diet/type: Regular consistency DVT prophylaxis: Systemic heparin GI prophylaxis: PPI Lines: Central line Foley:  Yes, and it is still needed Code Status:  full code Last date of multidisciplinary goals of care discussion [per primary ]  Labs   CBC: Recent Labs  Lab 01/22/2023 1000 01/07/2023 1003 01/16/2023 1141 01/14/2023 1148 12/31/2022 1600 12/28/2022 1812 12/25/22 0323  WBC 7.2  --   --   --  8.7 8.1 8.4  NEUTROABS 5.4  --   --   --   --   --   --   HGB 10.7*   < > 12.6* 12.9* 10.6* 10.1* 9.3*  HCT 32.5*   < > 37.0* 38.0* 33.3* 31.1* 28.2*  MCV 84.9  --   --   --  87.4 87.4 84.2  PLT 154  --   --   --  173 159 122*   < > = values in this interval not displayed.    Basic Metabolic Panel: Recent Labs  Lab 01/03/2023 1000 01/10/2023 1003 01/22/2023 1056 01/22/2023 1114 01/16/2023 1139 01/05/2023 1141 01/14/2023 1148 12/25/22 0323  NA 135 138   < > 121* 136 136 136 134*  K 3.7 3.8   < > 3.6 4.1 4.0 4.3 3.7  CL 106 107  --   --   --   --   --  102  CO2 16*  --   --   --   --   --   --  20*  GLUCOSE 286* 284*  --   --   --   --   --  291*  BUN 18 17  --   --   --   --   --  29*  CREATININE 1.05 0.80  --   --   --   --   --  1.34*  CALCIUM 8.6*  --   --   --   --   --   --  8.2*  MG  --   --   --   --   --   --   --  1.7   < > = values in this interval not displayed.   GFR: Estimated Creatinine Clearance: 35.3 mL/min (A) (by C-G formula based on SCr of 1.34 mg/dL (H)). Recent Labs  Lab 01/14/2023 1000 01/08/2023 1003 01/19/2023 1130 12/25/2022 1546  01/11/2023 1600 01/09/2023 1710 01/16/2023 1812 12/25/22 0323  WBC 7.2  --   --   --  8.7  --  8.1 8.4  LATICACIDVEN  --  2.2* 1.7 2.4*  --  2.3*  --   --     Liver Function Tests: Recent Labs  Lab 01/01/2023 1000  AST 25  ALT 21  ALKPHOS 46  BILITOT 0.7  PROT 6.1*  ALBUMIN 3.2*   No results for input(s): "LIPASE", "AMYLASE" in the last 168 hours. No results for input(s): "AMMONIA" in the last 168 hours.  ABG    Component Value Date/Time   PHART 7.214 (L) 01/07/2023 1114   PCO2ART 37.9 01/01/2023 1114   PO2ART 73 (L) 12/26/2022 1114   HCO3 20.2 01/19/2023 1148   TCO2 21 (L) 01/13/2023 1148   ACIDBASEDEF 5.0 (H) 01/18/2023 1148   O2SAT 66.3 12/25/2022 0758     Coagulation Profile: Recent Labs  Lab 12/28/2022 1000  INR 1.0    Cardiac Enzymes: No results for input(s): "CKTOTAL", "CKMB", "CKMBINDEX", "TROPONINI" in the last 168 hours.  HbA1C: Hemoglobin A1C  Date/Time Value Ref Range Status  09/01/2022 11:18 AM 7.8 (A) 4.0 - 5.6 % Final  04/22/2022 01:46 PM 8.0 (A) 4.0 - 5.6 % Final   Hgb A1c MFr Bld  Date/Time Value Ref Range Status  12/31/2022 10:00 AM 8.2 (H) 4.8 - 5.6 % Final    Comment:    (NOTE) Pre diabetes:          5.7%-6.4%  Diabetes:              >6.4%  Glycemic control for   <7.0% adults with diabetes   07/01/2021 03:48 PM 8.3 (H) 4.8 - 5.6 % Final    Comment:             Prediabetes: 5.7 - 6.4          Diabetes: >6.4          Glycemic control for adults with diabetes: <7.0     CBG: Recent Labs  Lab 01/04/2023 1302 01/07/2023 1707 01/02/2023 2157 12/25/22 0617  GLUCAP 318* 320* 226* 275*   The patient is critically ill due to cardiogenic shock due to ischemic cardiomyopathy critical care was necessary to treat or prevent imminent or life-threatening deterioration.  Critical care was time spent personally by me on the following activities: development of treatment plan with patient and/or surrogate as well as nursing, discussions with  consultants, evaluation of patient's response to treatment, examination of patient, obtaining history from patient or surrogate, ordering and performing treatments and interventions, ordering and review of laboratory studies, ordering and review of radiographic studies, pulse oximetry, re-evaluation of patient's condition and participation in multidisciplinary rounds.   During this encounter critical care time was devoted to patient care services described in this note for 35 minutes.     Cheri Fowler, MD [facility] See Amion for pager If no response to pager, please call [phone] until 7pm After 7pm, Please call E-link [phone]

## 2022-12-25 NOTE — Progress Notes (Signed)
[facility] ADULT ICU REPLACEMENT PROTOCOL   The patient does apply for the [facility] Adult ICU Electrolyte Replacment Protocol based on the criteria listed below:   1.Exclusion criteria: TCTS, ECMO, Dialysis, and Myasthenia Gravis patients 2. Is GFR >/= 30 ml/min? Yes.    Patient's GFR today is 52 3. Is SCr </= 2? Yes.   Patient's SCr is 1.34 mg/dL 4. Did SCr increase >/= 0.5 in 24 hours? No. 5.Pt's weight >40kg  Yes.   6. Abnormal electrolyte(s): Mg = 1.7    7. Electrolytes replaced per protocol 8.  Call MD STAT for K+ </= 2.5, Phos </= 1, or Mag </= 1 Physician:  Marcelline Deist, eMD   Jeremy Patton 12/25/2022 6:03 AM

## 2022-12-25 NOTE — Progress Notes (Signed)
PHARMACY - ANTICOAGULATION CONSULT NOTE  Pharmacy Consult for heparin Indication: impella  Allergies  Allergen Reactions   Protamine Other (See Comments)    Severe hypotension    Patient Measurements: Height: 5\' 9"  (175.3 cm) Weight: 60.8 kg (134 lb 0.6 oz) IBW/kg (Calculated) : 70.7 Last weight 10/21/22 ~140lbs  Heparin Dosing Weight: ~63kg  Vital Signs: Temp: 99.3 F (37.4 C) (11/02 0900) Temp Source: Core (11/02 0800) BP: 103/78 (11/02 0900) Pulse Rate: 94 (11/02 0900)  Labs: Recent Labs    01/11/2023 1000 01/15/2023 1003 01/05/2023 1056 01/14/2023 1600 01/15/2023 1812 01/12/2023 2300 12/25/22 0323 12/25/22 0800  HGB 10.7* 11.6*   < > 10.6* 10.1*  --  9.3*  --   HCT 32.5* 34.0*   < > 33.3* 31.1*  --  28.2*  --   PLT 154  --   --  173 159  --  122*  --   APTT 27  --   --   --   --   --   --   --   LABPROT 13.6  --   --   --   --   --   --   --   INR 1.0  --   --   --   --   --   --   --   HEPARINUNFRC  --   --   --   --   --  <0.10*  --  <0.10*  CREATININE 1.05 0.80  --   --   --   --  1.34*  --   TROPONINIHS 703*  --   --   --   --   --   --   --    < > = values in this interval not displayed.    Estimated Creatinine Clearance: 35.3 mL/min (A) (by C-G formula based on SCr of 1.34 mg/dL (H)).   Medical History: Past Medical History:  Diagnosis Date   Arthritis    Carotid artery disease (HCC)    a. s/p R CEA in 04/2015 b. 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 No anticoagulation prior to admission. Pharmacy consulted for heparin.    Heparin level this morning undetectable on heparin at 650 units/hr.  No overt bleeding, but continues to ooze around lines.  Goal of Therapy:  Heparin level 0.2 -0.5 units/ml Monitor platelets by anticoagulation protocol: Yes   Plan:  Increase heparin to 800 units/hr Heparin level in 8 hours Daily heparin level and CBC. Monitor for any worsening of hematoma  Reece Leader, Colon Flattery, [facility] Clinical Pharmacist  12/25/2022 9:55 AM   [facility] pharmacy phone numbers are listed on [email or web address]

## 2022-12-25 NOTE — Plan of Care (Signed)
  Problem: Education: Goal: Knowledge of General Education information will improve Description: Including pain rating scale, medication(s)/side effects and non-pharmacologic comfort measures Outcome: Progressing   Problem: Clinical Measurements: Goal: Ability to maintain clinical measurements within normal limits will improve Outcome: Progressing   Problem: Clinical Measurements: Goal: Diagnostic test results will improve Outcome: Progressing   Problem: Clinical Measurements: Goal: Respiratory complications will improve Outcome: Progressing   Problem: Clinical Measurements: Goal: Cardiovascular complication will be avoided Outcome: Progressing   Problem: Nutrition: Goal: Adequate nutrition will be maintained Outcome: Progressing   Problem: Safety: Goal: Ability to remain free from injury will improve Outcome: Progressing   Problem: Pain Management: Goal: General experience of comfort will improve Outcome: Progressing   Problem: Skin Integrity: Goal: Risk for impaired skin integrity will decrease Outcome: Progressing

## 2022-12-25 NOTE — Progress Notes (Signed)
Advanced Heart Failure Rounding Note   Subjective:    11/1 anterior/inferior STEMI d/t acute LAD Occlusion (RCA and LCX 100% CTO). LAD PCI. -> shock -> impella 11/1 Echo EF < 20 RV mildly down   Remains on Impella at P-7 Milrinone 0.125  Of NE. Co-ox 66%   Denies CP or SOB. Persistent hiccups   Swan numbers on P-7 RA 2 PA 23/12 (18)  PCW 12 Thermo 4.7/2.6 SVR 953 PAPi 11  POCUS: EF 25-30% RV ok. Impella 4.8   Objective:   Weight Range:  Vital Signs:   Temp:  [99 F (37.2 C)-100 F (37.8 C)] 99.3 F (37.4 C) (11/02 0900) Pulse Rate:  [0-114] 94 (11/02 0900) Resp:  [9-37] 18 (11/02 0900) BP: (83-160)/(53-143) 103/78 (11/02 0900) SpO2:  [75 %-100 %] 98 % (11/02 0900) FiO2 (%):  [60 %] 60 % (11/01 1329) Weight:  [60.8 kg] 60.8 kg (11/01 2000) Last BM Date : 12/25/22  Weight change: Filed Weights   01/17/2023 2000  Weight: 60.8 kg    Intake/Output:   Intake/Output Summary (Last 24 hours) at 12/25/2022 0915 Last data filed at 12/25/2022 0900 Gross per 24 hour  Intake 905.9 ml  Output 2084 ml  Net -1178.1 ml     Physical Exam: General:  Lying in bed. No resp difficulty HEENT: normal Neck: supple. RIJ swan Carotids 2+ bilat; no bruits. No lymphadenopathy or thryomegaly appreciated. Cor: . Regular rate & rhythm. No rubs, gallops or murmurs. Lungs: clear Abdomen: soft, nontender, nondistended. No hepatosplenomegaly. No bruits or masses. Good bowel sounds. Extremities: no cyanosis, clubbing, rash, edema RFA Impella groin soft Neuro: alert & orientedx3, cranial nerves grossly intact. moves all 4 extremities w/o difficulty. Affect pleasant   Telemetry: Sinus 90s Personally reviewed  Labs: Basic Metabolic Panel: Recent Labs  Lab 01/15/2023 1000 01/14/2023 1003 01/16/2023 1056 01/17/2023 1114 12/28/2022 1139 01/11/2023 1141 01/05/2023 1148 12/25/22 0323  NA 135 138   < > 121* 136 136 136 134*  K 3.7 3.8   < > 3.6 4.1 4.0 4.3 3.7  CL 106 107  --   --   --   --    --  102  CO2 16*  --   --   --   --   --   --  20*  GLUCOSE 286* 284*  --   --   --   --   --  291*  BUN 18 17  --   --   --   --   --  29*  CREATININE 1.05 0.80  --   --   --   --   --  1.34*  CALCIUM 8.6*  --   --   --   --   --   --  8.2*  MG  --   --   --   --   --   --   --  1.7   < > = values in this interval not displayed.    Liver Function Tests: Recent Labs  Lab 01/19/2023 1000  AST 25  ALT 21  ALKPHOS 46  BILITOT 0.7  PROT 6.1*  ALBUMIN 3.2*   No results for input(s): "LIPASE", "AMYLASE" in the last 168 hours. No results for input(s): "AMMONIA" in the last 168 hours.  CBC: Recent Labs  Lab 01/20/2023 1000 01/18/2023 1003 01/01/2023 1141 12/31/2022 1148 01/20/2023 1600 01/02/2023 1812 12/25/22 0323  WBC 7.2  --   --   --  8.7  8.1 8.4  NEUTROABS 5.4  --   --   --   --   --   --   HGB 10.7*   < > 12.6* 12.9* 10.6* 10.1* 9.3*  HCT 32.5*   < > 37.0* 38.0* 33.3* 31.1* 28.2*  MCV 84.9  --   --   --  87.4 87.4 84.2  PLT 154  --   --   --  173 159 122*   < > = values in this interval not displayed.    Cardiac Enzymes: No results for input(s): "CKTOTAL", "CKMB", "CKMBINDEX", "TROPONINI" in the last 168 hours.  BNP: BNP (last 3 results) No results for input(s): "BNP" in the last 8760 hours.  ProBNP (last 3 results) No results for input(s): "PROBNP" in the last 8760 hours.    Other results:  Imaging: CARDIAC CATHETERIZATION  Addendum Date: 01/13/2023   Coronary angiography and intervention 12/26/2022: LM: Normal LAD: 80% calcific proximal/mid LAD disease          Mid 100% occlusion (culprit lesion)          Distal apical 80% stenosis          Left to left, and left to right collaterals from LAD to LCx and RCA respectively Lcx: Proximal 100% occlusion (likely chronic) RCA: Mid 100% occlusion (likely chronic)  LVEDP 26 mmHg  Right heart catheterization 12/31/2022: (Performed after Impella guided PCI) RA: 5 mmHg RV: 59/0 mmHg PA: 41/19 mmHg, mPAP 30 mmHg PCW: 29 mmHg AO sats:  87% PA sats: 54% CO: 4.8 L/min CI: 2.7 L/min/m2 Impella CP placement to right common femoral artery Impella assisted PCI to mid LAD with overlapping stents 2.5 x 20 mm Synergy, and 2.75 x 12 mm Synergy DES Small hematoma noted at right groin and right neck site. Femostop placed, Continue management of cardiogenic shock. After recovery of shock, could consider intervention to normal culprit proximal/mid LAD severe stenosis.  Nonsustained related delay in door to device time due to following patient related factors: Severe subclavian and aorta tortuosity, requiring placement of destination sheath, cardiogenic shock requiring mechanical circulatory support. Elder Negus, MD     Addendum Date: 01/02/2023   Coronary angiography and intervention 01/18/2023: LM: Normal LAD: 80% calcific proximal/mid LAD disease          Mid 100% occlusion (culprit lesion)          Distal apical 80% stenosis          Left to left, and left to right collaterals from LAD to LCx and RCA respectively Lcx: Proximal 100% occlusion (likely chronic) RCA: Mid 100% occlusion (likely chronic)  LVEDP 26 mmHg  Right heart catheterization 01/02/2023: (Performed after Impella guided PCI) RA: 5 mmHg RV: 59/0 mmHg PA: 41/19 mmHg, mPAP 30 mmHg PCW: 29 mmHg AO sats: 87% PA sats: 54% CO: 4.8 L/min CI: 2.7 L/min/m2 Impella guided PCI to mid LAD with overlapping stents 2.5 x 20 mm Synergy, and 2.75 x 12 mm Synergy DES Small hematoma noted at right groin and right neck site. Femostop placed, Continue management of cardiogenic shock. After recovery of shock, could consider intervention to normal culprit proximal/mid LAD severe stenosis.  Nonsustained related delay in door to device time due to following patient related factors: Severe subclavian and aorta tortuosity, requiring placement of destination sheath, cardiogenic shock requiring mechanical circulatory support. Elder Negus, MD     Result Date: 01/16/2023 Table formatting from the original  result was not included. Images from the original result  were not included. Coronary angiography and intervention 12/26/2022: LM: Normal LAD: 80% calcific proximal/mid LAD disease          Mid 100% occlusion (culprit lesion)          Distal apical 80% stenosis          Left to left, and left to right collaterals from LAD to LCx and RCA respectively Lcx: Proximal 100% occlusion (likely chronic) RCA: Mid 100% occlusion (likely chronic)  LVEDP 26 mmHg  Right heart catheterization 01/15/2023: (Performed after Impella guided PCI) RA: 5 mmHg RV: 59/0 mmHg PA: 41/19 mmHg, mPAP 30 mmHg PCW: 29 mmHg AO sats: 87% PA sats: 54% CO: 4.8 L/min CI: 2.7 L/min/m2 Impella guided PCI to mid LAD with overlapping stents 2.5 x 20 mm Synergy, and 2.75 x 12 mm Synergy DES Small hematoma noted at right groin and right neck site. Femostop placed, Continue management of cardiogenic shock. After recovery of shock, could consider intervention to normal culprit proximal/mid LAD severe stenosis.  Elder Negus, MD     DG CHEST PORT 1 VIEW  Result Date: 01/06/2023 CLINICAL DATA:  STEMI EXAM: PORTABLE CHEST 1 VIEW COMPARISON:  06/22/2021 FINDINGS: Swan-Ganz catheter tip in the central right pulmonary artery. Impella device in the left ventricle. Diffuse interstitial opacities throughout the lungs, likely edema. Heart and mediastinal contours within normal limits. No effusions or pneumothorax. IMPRESSION: Diffuse interstitial opacities throughout the lungs compatible with edema. Electronically Signed   By: Charlett Nose M.D.   On: 01/13/2023 17:33   ECHOCARDIOGRAM COMPLETE  Result Date: 01/20/2023    ECHOCARDIOGRAM REPORT   Patient Name:   Jeremy Patton Date of Exam: 01/16/2023 Medical Rec #:  [id number]           Height:       69.0 in Accession #:    [id number]          Weight:       140.2 lb Date of Birth:  1941-02-13           BSA:          1.776 m Patient Age:    82 years            BP:           149/91 mmHg Patient Gender: M                    HR:           105 bpm. Exam Location:  Inpatient Procedure: 2D Echo, Cardiac Doppler and Color Doppler STAT ECHO Indications:    Shock  History:        Patient has prior history of Echocardiogram examinations, most                 recent 06/09/2016. CHF, impella; Risk Factors:Hypertension,                 Dyslipidemia and Diabetes.  Sonographer:    Delcie Roch RDCS Referring Phys: [id number] [facility] J PATWARDHAN  Sonographer Comments: Image acquisition challenging due to respiratory motion. IMPRESSIONS  1. LV function is depressed with akinesis of the inferior, inferoseptal, distal anterior and apical walls; severe hypokinesis elsewhere. Catheter protruding into LV (inlet is 36 mm below valve plane). Left ventricular ejection fraction, by estimation, is <20%. The left ventricle has severely decreased function.  2. Right ventricular systolic function is mildly reduced. The right ventricular size is normal.  3. Bidirectional flow seen by color doppler .  Evidence of atrial level shunting detected by color flow Doppler.  4. A small pericardial effusion is present.  5. MR is eccentric, directed posterior into LA. Mild mitral valve regurgitation.  6. The aortic valve was not well visualized.  7. The inferior vena cava is normal in size with <50% respiratory variability, suggesting right atrial pressure of 8 mmHg. FINDINGS  Left Ventricle: LV function is depressed with akinesis of the inferior, inferoseptal, distal anterior and apical walls; severe hypokinesis elsewhere. Catheter protruding into LV (inlet is 36 mm below valve plane). Left ventricular ejection fraction, by estimation, is <20%. The left ventricle has severely decreased function. The left ventricular internal cavity size was normal in size. There is no left ventricular hypertrophy. Right Ventricle: The right ventricular size is normal. Right vetricular wall thickness was not assessed. Right ventricular systolic function is mildly reduced. Left  Atrium: Left atrial size was normal in size. Right Atrium: Right atrial size was normal in size. Pericardium: A small pericardial effusion is present. Mitral Valve: MR is eccentric, directed posterior into LA. There is mild thickening of the mitral valve leaflet(s). Mild mitral valve regurgitation. Aortic Valve: The aortic valve was not well visualized. Pulmonic Valve: The pulmonic valve was normal in structure. Pulmonic valve regurgitation is not visualized. Aorta: The aortic root is normal in size and structure. Venous: The inferior vena cava is normal in size with less than 50% respiratory variability, suggesting right atrial pressure of 8 mmHg. IAS/Shunts: Evidence of atrial level shunting detected by color flow Doppler.  LEFT VENTRICLE PLAX 2D LVIDd:         5.40 cm Diastology LVIDs:         5.30 cm LV e' medial:  3.59 cm/s LV PW:         0.80 cm LV e' lateral: 9.03 cm/s LV IVS:        0.80 cm  RIGHT VENTRICLE         IVC TAPSE (M-mode): 1.3 cm  IVC diam: 1.80 cm LEFT ATRIUM             Index        RIGHT ATRIUM          Index LA diam:        3.90 cm 2.20 cm/m   RA Area:     7.40 cm LA Vol (A2C):   45.0 ml 25.33 ml/m  RA Volume:   11.70 ml 6.59 ml/m LA Vol (A4C):   27.5 ml 15.48 ml/m LA Biplane Vol: 36.2 ml 20.38 ml/m Dietrich Pates MD Electronically signed by Dietrich Pates MD Signature Date/Time: 01/10/2023/2:41:35 PM    Final      Medications:     Scheduled Medications:  aspirin EC  81 mg Oral Daily   Chlorhexidine Gluconate Cloth  6 each Topical Daily   hydrALAZINE  10 mg Oral Q8H   insulin aspart  0-15 Units Subcutaneous TID WC   insulin glargine-yfgn  10 Units Subcutaneous QHS   pantoprazole  40 mg Oral Daily   rosuvastatin  20 mg Oral Daily   sertraline  50 mg Oral Daily   sodium chloride flush  3 mL Intravenous Q12H   tamsulosin  0.4 mg Oral QPC supper   ticagrelor  90 mg Oral BID    Infusions:  sodium chloride     sodium chloride     sodium chloride Stopped (12/25/22 0749)    dextrose 5 % (Impella PURGE) solution 1000 mL bag     epinephrine  heparin 800 Units/hr (12/25/22 0849)   milrinone 0.125 mcg/kg/min (12/25/22 0800)   potassium chloride 10 mEq (12/25/22 0856)    PRN Medications: sodium chloride, Place/Maintain arterial line **AND** sodium chloride, sodium chloride, acetaminophen, ondansetron (ZOFRAN) IV, mouth rinse, sodium chloride flush   Assessment/Plan:   1. Anterior STEMI>>Cardiogenic shock -Echo 4/18 EF 55-60%, - Presented with STEMI 11/1. LHC: LM: Normal. LAD: 80% calcific proximal/mid LAD disease, Mid 100% occlusion (culprit lesion) Distal apical 80% stenosis. LCX 100% pCTO, RCA 100% mCTO with L->L and L-> R collats. s/p mid LAD overlap DES - Echo 11/1 EF < 20 RV mild HK - POCUS 11/2 EF 25-30% RV ok  - Impella CP placed for hemodynamic support with CGS, currently on P7 - On milrinone 0.125. Co-ox 66% - Swan numbers look great Wean Impella to P-6. Follow swan - Volume low. No diuretics - Continue Brilinta and statin - Add digoxin. - Try to wean off Impella next 24-48 hours - Continue heparin systemically and bicarb in purge. Discussed dosing with PharmD personally.  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 L groin hematoma - post-procedure. 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  Critical care was necessary to treat or prevent imminent or life-threatening deterioration.  Critical care was time spent personally by me (independent of midlevel providers or residents) on the following activities: development  of treatment plan with patient and/or surrogate as well as nursing, discussions with consultants, evaluation of patient's response to treatment, examination of patient, obtaining history from patient or surrogate, ordering and performing treatments and interventions, ordering and review of laboratory studies, ordering and review of radiographic studies, pulse oximetry and re-evaluation of patient's condition.  Length of Stay: 1   Arvilla Meres MD 12/25/2022, 9:15 AM  Advanced Heart Failure Team Pager [phone] (M-F; 7a - 4p)  Please contact [facility] for night-coverage after hours (4p -7a ) and weekends on [email or web address]

## 2022-12-25 NOTE — Progress Notes (Signed)
eLink Physician-Brief Progress Note Patient Name: Jeremy Patton DOB: 1941/02/11 MRN: [id number]   Date of Service  12/25/2022  HPI/Events of Note  Insomnia  eICU Interventions   Eval of home meds    Insomnia Takes Melatonin at home We will prescribe here   Intervention Category Minor Interventions: Other:  [name] 12/25/2022, 8:18 PM

## 2022-12-25 NOTE — Progress Notes (Signed)
   Impella turned dow to P-6 this am.   Throughout morning SBP dropped consistently with MAPs dropping into 50s.   Given 250cc IVF without improvement. Impella turned back to p-8. NE restarted. Given 500 cc IVF for CVP 0-1.   MAPs improved.   Additional CCT 40 mins.   Arvilla Meres, MD  4:57 PM

## 2022-12-25 NOTE — Progress Notes (Signed)
Pt BP and AO noted.  Dr Gala Romney paged.  CPO 0.4.  Impella on P6.  Impella increased to P8 by Dr Gala Romney.

## 2022-12-25 NOTE — Progress Notes (Signed)
PHARMACY - ANTICOAGULATION CONSULT NOTE  Pharmacy Consult for heparin Indication: impella  Allergies  Allergen Reactions   Protamine Other (See Comments)    Severe hypotension    Patient Measurements: Height: 5\' 9"  (175.3 cm) Weight: 60.8 kg (134 lb 0.6 oz) IBW/kg (Calculated) : 70.7 Last weight 10/21/22 ~140lbs  Heparin Dosing Weight: ~63kg  Vital Signs: Temp: 99.1 F (37.3 C) (11/02 1733) Temp Source: Core (11/02 1200) BP: 104/78 (11/02 1730) Pulse Rate: 98 (11/02 1733)  Labs: Recent Labs    01/14/2023 1000 01/06/2023 1003 01/21/2023 1056 01/20/2023 1600 01/10/2023 1812 01/16/2023 2300 12/25/22 0323 12/25/22 0800 12/25/22 1700  HGB 10.7* 11.6*   < > 10.6* 10.1*  --  9.3*  --   --   HCT 32.5* 34.0*   < > 33.3* 31.1*  --  28.2*  --   --   PLT 154  --   --  173 159  --  122*  --   --   APTT 27  --   --   --   --   --   --   --   --   LABPROT 13.6  --   --   --   --   --   --   --   --   INR 1.0  --   --   --   --   --   --   --   --   HEPARINUNFRC  --   --   --   --   --  <0.10*  --  <0.10* 0.15*  CREATININE 1.05 0.80  --   --   --   --  1.34*  --   --   TROPONINIHS 703*  --   --   --   --   --   --   --   --    < > = values in this interval not displayed.    Estimated Creatinine Clearance: 35.3 mL/min (A) (by C-G formula based on SCr of 1.34 mg/dL (H)).    Assessment: 82 yo M with STEMI s/p PCI c/b intraoperative hypotension requiring impella placement 11/1. No anticoagulation prior to admission. Pharmacy consulted for heparin.    Heparin level remains slightly subtherapeutic (0.15) on infusion at 800 units/hr. No issues with line or bleeding reported per RN. Pt with remote groin hematoma post-procedure - no evidence of ongoing bleeding.  Goal of Therapy:  Heparin level 0.2 -0.5 units/ml Monitor platelets by anticoagulation protocol: Yes   Plan:  Increase heparin to 850 units/hr Heparin level in 8 hours  Christoper Fabian, PharmD, BCPS Please see amion for complete  clinical pharmacist phone list  12/25/2022 5:57 PM

## 2022-12-25 NOTE — Progress Notes (Signed)
[facility] ADULT ICU REPLACEMENT PROTOCOL   The patient does apply for the [facility] Adult ICU Electrolyte Replacment Protocol based on the criteria listed below:   1.Exclusion criteria: TCTS, ECMO, Dialysis, and Myasthenia Gravis patients 2. Is GFR >/= 30 ml/min? Yes.    Patient's GFR today is 52 3. Is SCr </= 2? Yes.   Patient's SCr is 1.34  mg/dL 4. Did SCr increase >/= 0.5 in 24 hours? No. 5.Pt's weight >40kg  Yes.   6. Abnormal electrolyte(s): K+ = 3.7, Mg = 1.7  7. Electrolytes replaced per protocol 8.  Call MD STAT for K+ </= 2.5, Phos </= 1, or Mag </= 1 Physician:  eMD, Dia Sitter 12/25/2022 4:17 AM

## 2022-12-26 ENCOUNTER — Inpatient Hospital Stay (HOSPITAL_COMMUNITY): Payer: Medicare PPO

## 2022-12-26 DIAGNOSIS — R570 Cardiogenic shock: Secondary | ICD-10-CM | POA: Diagnosis not present

## 2022-12-26 LAB — COOXEMETRY PANEL
Carboxyhemoglobin: 2.3 % — ABNORMAL HIGH (ref 0.5–1.5)
Methemoglobin: 1.1 % (ref 0.0–1.5)
O2 Saturation: 62.4 %
Total hemoglobin: 7.4 g/dL — ABNORMAL LOW (ref 12.0–16.0)

## 2022-12-26 LAB — CBC
HCT: 20.7 % — ABNORMAL LOW (ref 39.0–52.0)
HCT: 24.8 % — ABNORMAL LOW (ref 39.0–52.0)
Hemoglobin: 7 g/dL — ABNORMAL LOW (ref 13.0–17.0)
Hemoglobin: 8.3 g/dL — ABNORMAL LOW (ref 13.0–17.0)
MCH: 28.8 pg (ref 26.0–34.0)
MCH: 27.9 pg (ref 26.0–34.0)
MCHC: 33.8 g/dL (ref 30.0–36.0)
MCHC: 33.5 g/dL (ref 30.0–36.0)
MCV: 85.2 fL (ref 80.0–100.0)
MCV: 83.2 fL (ref 80.0–100.0)
Platelets: 77 10*3/uL — ABNORMAL LOW (ref 150–400)
Platelets: 68 10*3/uL — ABNORMAL LOW (ref 150–400)
RBC: 2.43 MIL/uL — ABNORMAL LOW (ref 4.22–5.81)
RBC: 2.98 MIL/uL — ABNORMAL LOW (ref 4.22–5.81)
RDW: 16.1 % — ABNORMAL HIGH (ref 11.5–15.5)
RDW: 16.2 % — ABNORMAL HIGH (ref 11.5–15.5)
WBC: 8.7 10*3/uL (ref 4.0–10.5)
WBC: 8.3 10*3/uL (ref 4.0–10.5)
nRBC: 0 % (ref 0.0–0.2)
nRBC: 0 % (ref 0.0–0.2)

## 2022-12-26 LAB — BASIC METABOLIC PANEL
Anion gap: 6 (ref 5–15)
BUN: 24 mg/dL — ABNORMAL HIGH (ref 8–23)
CO2: 23 mmol/L (ref 22–32)
Calcium: 8 mg/dL — ABNORMAL LOW (ref 8.9–10.3)
Chloride: 107 mmol/L (ref 98–111)
Creatinine, Ser: 1.28 mg/dL — ABNORMAL HIGH (ref 0.61–1.24)
GFR, Estimated: 55 mL/min — ABNORMAL LOW (ref >60)
Glucose, Bld: 274 mg/dL — ABNORMAL HIGH (ref 70–99)
Potassium: 3.8 mmol/L (ref 3.5–5.1)
Sodium: 136 mmol/L (ref 135–145)

## 2022-12-26 LAB — GLUCOSE, CAPILLARY
Glucose-Capillary: 124 mg/dL — ABNORMAL HIGH (ref 70–99)
Glucose-Capillary: 132 mg/dL — ABNORMAL HIGH (ref 70–99)
Glucose-Capillary: 182 mg/dL — ABNORMAL HIGH (ref 70–99)
Glucose-Capillary: 191 mg/dL — ABNORMAL HIGH (ref 70–99)
Glucose-Capillary: 236 mg/dL — ABNORMAL HIGH (ref 70–99)

## 2022-12-26 LAB — LIPOPROTEIN A (LPA): Lipoprotein (a): 100.6 nmol/L — ABNORMAL HIGH (ref <75.0)

## 2022-12-26 LAB — PREPARE RBC (CROSSMATCH)

## 2022-12-26 LAB — LACTATE DEHYDROGENASE: LDH: 954 U/L — ABNORMAL HIGH (ref 98–192)

## 2022-12-26 LAB — FIBRINOGEN: Fibrinogen: 313 mg/dL (ref 210–475)

## 2022-12-26 LAB — MAGNESIUM: Magnesium: 2.1 mg/dL (ref 1.7–2.4)

## 2022-12-26 LAB — HEPARIN LEVEL (UNFRACTIONATED): Heparin Unfractionated: 0.21 [IU]/mL — ABNORMAL LOW (ref 0.30–0.70)

## 2022-12-26 MED ORDER — POTASSIUM CHLORIDE CRYS ER 20 MEQ PO TBCR
20.0000 meq | EXTENDED_RELEASE_TABLET | Freq: Once | ORAL | Status: AC
  Administered 2022-12-26: 20 meq via ORAL
  Filled 2022-12-26: qty 1

## 2022-12-26 MED ORDER — SODIUM CHLORIDE 0.9% IV SOLUTION: Freq: Once | INTRAVENOUS | Status: AC

## 2022-12-26 MED ORDER — FUROSEMIDE 10 MG/ML IJ SOLN: 20.0000 mg | Freq: Once | INTRAMUSCULAR | Status: DC

## 2022-12-26 NOTE — Progress Notes (Signed)
Advanced Heart Failure Rounding Note   Subjective:    11/1 anterior/inferior STEMI d/t acute LAD Occlusion (RCA and LCX 100% CTO). LAD PCI. -> shock -> impella 11/1 Echo EF < 20 RV mildly down   Failed Impella wean to P-6 yesterday with hypotension. Had to go back to P-8 and starte NE.   Remains on Impella at P-8 Milrinone 0.125  NE off. Flow 3.6 on Impella. Motor current ok   Feels good. Denies CP or SOB. Hgd dropped 12.9 -> 9.3 -> 7.0 No obvious bleeding. Groin site ok.  PLTs 122 -> 77 -> 68  POCUS: EF 25-30% RV ok. Impella was too deep so I pulled it back 2cm   Objective:   Weight Range:  Vital Signs:   Temp:  [98.6 F (37 C)-99.5 F (37.5 C)] 99.1 F (37.3 C) (11/03 1130) Pulse Rate:  [74-109] 92 (11/03 1145) Resp:  [6-28] 19 (11/03 1145) BP: (63-131)/(48-94) 122/86 (11/03 1130) SpO2:  [90 %-100 %] 98 % (11/03 1015) Weight:  [63.5 kg] 63.5 kg (11/03 0600) Last BM Date : 12/25/22  Weight change: Filed Weights   01/18/2023 2000 12/26/22 0600  Weight: 60.8 kg 63.5 kg    Intake/Output:   Intake/Output Summary (Last 24 hours) at 12/26/2022 1157 Last data filed at 12/26/2022 1100 Gross per 24 hour  Intake 2324.83 ml  Output 2708 ml  Net -383.17 ml     Physical Exam: General:  Lying in bed. No resp difficulty HEENT: normal Neck: supple. RIJ swan Carotids 2+ bilat; no bruits. No lymphadenopathy or thryomegaly appreciated. Cor: PMI nondisplaced. Regular rate & rhythm. No rubs, gallops or murmurs. Lungs: clear Abdomen: soft, nontender, nondistended. No hepatosplenomegaly. No bruits or masses. Good bowel sounds. Extremities: no cyanosis, clubbing, rash, edema R groin impell a site ok Neuro: alert & orientedx3, cranial nerves grossly intact. moves all 4 extremities w/o difficulty. Affect pleasant   Telemetry: Sinus 90s Personally reviewed  Labs: Basic Metabolic Panel: Recent Labs  Lab 01/07/2023 1000 01/11/2023 1003 01/15/2023 1056 12/27/2022 1139 12/25/2022 1141  01/16/2023 1148 12/25/22 0323 12/26/22 0228  NA 135 138   < > 136 136 136 134* 136  K 3.7 3.8   < > 4.1 4.0 4.3 3.7 3.8  CL 106 107  --   --   --   --  102 107  CO2 16*  --   --   --   --   --  20* 23  GLUCOSE 286* 284*  --   --   --   --  291* 274*  BUN 18 17  --   --   --   --  29* 24*  CREATININE 1.05 0.80  --   --   --   --  1.34* 1.28*  CALCIUM 8.6*  --   --   --   --   --  8.2* 8.0*  MG  --   --   --   --   --   --  1.7 2.1   < > = values in this interval not displayed.    Liver Function Tests: Recent Labs  Lab 12/28/2022 1000  AST 25  ALT 21  ALKPHOS 46  BILITOT 0.7  PROT 6.1*  ALBUMIN 3.2*   No results for input(s): "LIPASE", "AMYLASE" in the last 168 hours. No results for input(s): "AMMONIA" in the last 168 hours.  CBC: Recent Labs  Lab 01/20/2023 1000 01/03/2023 1003 01/06/2023 1600 01/13/2023 1812 12/25/22 0323 12/26/22  0228 12/26/22 1003  WBC 7.2  --  8.7 8.1 8.4 8.7 8.3  NEUTROABS 5.4  --   --   --   --   --   --   HGB 10.7*   < > 10.6* 10.1* 9.3* 7.0* 8.3*  HCT 32.5*   < > 33.3* 31.1* 28.2* 20.7* 24.8*  MCV 84.9  --  87.4 87.4 84.2 85.2 83.2  PLT 154  --  173 159 122* 77* 68*   < > = values in this interval not displayed.    Cardiac Enzymes: No results for input(s): "CKTOTAL", "CKMB", "CKMBINDEX", "TROPONINI" in the last 168 hours.  BNP: BNP (last 3 results) No results for input(s): "BNP" in the last 8760 hours.  ProBNP (last 3 results) No results for input(s): "PROBNP" in the last 8760 hours.    Other results:  Imaging: CT CHEST ABDOMEN PELVIS WO CONTRAST  Result Date: 12/26/2022 CLINICAL DATA:  Retroperitoneal bleed EXAM: CT CHEST, ABDOMEN AND PELVIS WITHOUT CONTRAST TECHNIQUE: Multidetector CT imaging of the chest, abdomen and pelvis was performed following the standard protocol without IV contrast. RADIATION DOSE REDUCTION: This exam was performed according to the departmental dose-optimization program which includes automated exposure control,  adjustment of the mA and/or kV according to patient size and/or use of iterative reconstruction technique. COMPARISON:  Chest radiograph 1 day prior FINDINGS: CT CHEST FINDINGS Cardiovascular: An Impella device is in place terminating in the left ventricle. A right IJ Swan-Ganz catheter is in place terminating in the right pulmonary artery. There are extensive coronary artery calcifications and mild calcified plaque throughout the thoracic aorta. The heart size is normal. There is no pericardial effusion. Mediastinum/Nodes: The thyroid is unremarkable. The esophagus is grossly unremarkable. There is no mediastinal or axillary lymphadenopathy. There is no bulky hilar adenopathy, within the confines of noncontrast technique. Lungs/Pleura: The trachea and central airways are patent. There are small bilateral pleural effusions with adjacent airspace opacities. There is no overt edema. There is no pneumothorax. There are no suspicious nodules. Musculoskeletal: There is no acute osseous abnormality or suspicious osseous lesion. There is a probable benign intraosseous hemangioma in the T11 vertebral body. CT ABDOMEN PELVIS FINDINGS Hepatobiliary: The liver is unremarkable. Layering hyperdensity in the gallbladder likely reflects vicarious excretion of contrast. There is no biliary ductal dilatation. Pancreas: Grossly unremarkable. Spleen: Unremarkable. Adrenals/Urinary Tract: The adrenals are unremarkable. There is retained contrast in the kidneys. There are no suspicious renal lesions. There are no stones in either kidney or along the course of either ureter. There is no hydronephrosis or hydroureter. A Foley catheter is in place in the bladder accounting for the anti dependent gas. Stomach/Bowel: The stomach is unremarkable. There is no evidence of bowel obstruction. There is no abnormal bowel wall thickening or inflammatory change. There is moderate stool throughout the colon. The appendix is normal. There is colonic  diverticulosis without evidence of acute diverticulitis. Vascular/Lymphatic: There is extensive calcified plaque throughout the nonaneurysmal abdominal aorta. There is no abdominal or pelvic lymphadenopathy. Reproductive: The prostate is enlarged. Other: There is no ascites or free intraperitoneal air. There is no retroperitoneal hematoma. There is no right groin hematoma. Musculoskeletal: There is a small lipoma in the left gluteal musculature. There is no acute osseous abnormality or suspicious osseous lesion. There is advanced disc degeneration at L4-L5. IMPRESSION: 1. No retroperitoneal hematoma, right groin hematoma, or other acute finding in the abdomen or pelvis. 2. Small bilateral pleural effusions with adjacent airspace opacity which may reflect atelectasis, infection,  or combination thereof. 3. Colonic diverticulosis without evidence of acute diverticulitis. 4. Persistent contrast in the kidneys suggesting acute renal injury. 5. Impella device terminating in the left ventricle and Swan-Ganz catheter terminating in the right pulmonary artery. Electronically Signed   By: Lesia Hausen M.D.   On: 12/26/2022 11:07   DG Chest Port 1 View  Result Date: 12/25/2022 CLINICAL DATA:  Pulmonary edema EXAM: PORTABLE CHEST 1 VIEW COMPARISON:  Chest x-ray dated December 28, 2022 FINDINGS: Patient rotation somewhat limits evaluation. Cardiac and mediastinal contours are unchanged. Slight interval retraction of PA catheter which overlies the expected area of the right pulmonary artery. Stable position of Impella device. Diffuse interstitial opacities, decreased when compared with the prior. No evidence of pleural effusion or pneumothorax. IMPRESSION: Decreased interstitial opacities, likely improving pulmonary edema. Electronically Signed   By: Allegra Lai M.D.   On: 12/25/2022 09:33   DG CHEST PORT 1 VIEW  Result Date: 01/02/2023 CLINICAL DATA:  STEMI EXAM: PORTABLE CHEST 1 VIEW COMPARISON:  06/22/2021  FINDINGS: Swan-Ganz catheter tip in the central right pulmonary artery. Impella device in the left ventricle. Diffuse interstitial opacities throughout the lungs, likely edema. Heart and mediastinal contours within normal limits. No effusions or pneumothorax. IMPRESSION: Diffuse interstitial opacities throughout the lungs compatible with edema. Electronically Signed   By: Charlett Nose M.D.   On: 12/27/2022 17:33   ECHOCARDIOGRAM COMPLETE  Result Date: 01/20/2023    ECHOCARDIOGRAM REPORT   Patient Name:   Jeremy Patton Date of Exam: 12/25/2022 Medical Rec #:  [id number]           Height:       69.0 in Accession #:    [id number]          Weight:       140.2 lb Date of Birth:  1940-10-17           BSA:          1.776 m Patient Age:    82 years            BP:           149/91 mmHg Patient Gender: M                   HR:           105 bpm. Exam Location:  Inpatient Procedure: 2D Echo, Cardiac Doppler and Color Doppler STAT ECHO Indications:    Shock  History:        Patient has prior history of Echocardiogram examinations, most                 recent 06/09/2016. CHF, impella; Risk Factors:Hypertension,                 Dyslipidemia and Diabetes.  Sonographer:    Delcie Roch RDCS Referring Phys: [id number] [facility] J PATWARDHAN  Sonographer Comments: Image acquisition challenging due to respiratory motion. IMPRESSIONS  1. LV function is depressed with akinesis of the inferior, inferoseptal, distal anterior and apical walls; severe hypokinesis elsewhere. Catheter protruding into LV (inlet is 36 mm below valve plane). Left ventricular ejection fraction, by estimation, is <20%. The left ventricle has severely decreased function.  2. Right ventricular systolic function is mildly reduced. The right ventricular size is normal.  3. Bidirectional flow seen by color doppler . Evidence of atrial level shunting detected by color flow Doppler.  4. A small pericardial effusion is present.  5. MR is eccentric, directed posterior  into  LA. Mild mitral valve regurgitation.  6. The aortic valve was not well visualized.  7. The inferior vena cava is normal in size with <50% respiratory variability, suggesting right atrial pressure of 8 mmHg. FINDINGS  Left Ventricle: LV function is depressed with akinesis of the inferior, inferoseptal, distal anterior and apical walls; severe hypokinesis elsewhere. Catheter protruding into LV (inlet is 36 mm below valve plane). Left ventricular ejection fraction, by estimation, is <20%. The left ventricle has severely decreased function. The left ventricular internal cavity size was normal in size. There is no left ventricular hypertrophy. Right Ventricle: The right ventricular size is normal. Right vetricular wall thickness was not assessed. Right ventricular systolic function is mildly reduced. Left Atrium: Left atrial size was normal in size. Right Atrium: Right atrial size was normal in size. Pericardium: A small pericardial effusion is present. Mitral Valve: MR is eccentric, directed posterior into LA. There is mild thickening of the mitral valve leaflet(s). Mild mitral valve regurgitation. Aortic Valve: The aortic valve was not well visualized. Pulmonic Valve: The pulmonic valve was normal in structure. Pulmonic valve regurgitation is not visualized. Aorta: The aortic root is normal in size and structure. Venous: The inferior vena cava is normal in size with less than 50% respiratory variability, suggesting right atrial pressure of 8 mmHg. IAS/Shunts: Evidence of atrial level shunting detected by color flow Doppler.  LEFT VENTRICLE PLAX 2D LVIDd:         5.40 cm Diastology LVIDs:         5.30 cm LV e' medial:  3.59 cm/s LV PW:         0.80 cm LV e' lateral: 9.03 cm/s LV IVS:        0.80 cm  RIGHT VENTRICLE         IVC TAPSE (M-mode): 1.3 cm  IVC diam: 1.80 cm LEFT ATRIUM             Index        RIGHT ATRIUM          Index LA diam:        3.90 cm 2.20 cm/m   RA Area:     7.40 cm LA Vol (A2C):   45.0 ml  25.33 ml/m  RA Volume:   11.70 ml 6.59 ml/m LA Vol (A4C):   27.5 ml 15.48 ml/m LA Biplane Vol: 36.2 ml 20.38 ml/m Dietrich Pates MD Electronically signed by Dietrich Pates MD Signature Date/Time: 01/11/2023/2:41:35 PM    Final      Medications:     Scheduled Medications:  aspirin EC  81 mg Oral Daily   Chlorhexidine Gluconate Cloth  6 each Topical Daily   digoxin  0.0625 mg Oral Daily   hydrALAZINE  10 mg Oral Q8H   insulin aspart  0-15 Units Subcutaneous TID WC   insulin glargine-yfgn  14 Units Subcutaneous BID   melatonin  3 mg Oral QHS   pantoprazole  40 mg Oral Daily   rosuvastatin  20 mg Oral Daily   sertraline  50 mg Oral Daily   sodium chloride flush  3 mL Intravenous Q12H   tamsulosin  0.4 mg Oral QPC supper   ticagrelor  90 mg Oral BID    Infusions:  epinephrine     milrinone 0.125 mcg/kg/min (12/26/22 1100)   norepinephrine (LEVOPHED) Adult infusion Stopped (12/26/22 0358)   sodium bicarbonate 25 mEq (Impella PURGE) in dextrose 5 % 1000 mL bag      PRN Medications: sodium chloride, acetaminophen,  lidocaine-prilocaine, ondansetron (ZOFRAN) IV, mouth rinse, sodium chloride flush   Assessment/Plan:   1. Anterior STEMI>>Cardiogenic shock -Echo 4/18 EF 55-60%, - Presented with STEMI 11/1. LHC: LM: Normal. LAD: 80% calcific proximal/mid LAD disease, Mid 100% occlusion (culprit lesion) Distal apical 80% stenosis. LCX 100% pCTO, RCA 100% mCTO with L->L and L-> R collats. s/p mid LAD overlap DES - Echo 11/1 EF < 20 RV mild HK - POCUS 11/2 EF 25-30% RV ok  - Impella CP placed for hemodynamic support with CGS, currently on P8 - On milrinone 0.125. Co-ox 62% - Failed Impella wean to P-6 on 11/2 but now appears he was also bleeding yesterday - Ernestine Conrad numbers look great today (except for low CVP). Has gotten 1u RBCs. Will check repeat hgb. Hold heparin. No diuretics. Continue milrinone - Stat CT C/A/P 11/3 no bleed.  - Hold diuretics - Continue Brilinta and statin - Continue  digoxin. - Will drop Impella to p-7 and follow  2. 3v CAD now s/p anterior/inferior STEMI - s/p PCI LAD - continue DAPT/statin - No cs/s angina  3.Acute respiratory failure - required NRB in lab 2/2 desaturations - now on RA - PCCMhas signed off   4. Acute blood loss anemia with L groin hematoma - post-procedure. - hgb 12.9 -> 10.1 -> 9.3 -> 7.0 - no evidence of ongoing bleeding on exam. Stat CT C/A/P with no bleed.  - Has received 1u RBC -> hgb 8.3 - Will follow. 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 Thrombocytopenia  - PLTS 122 -> 77 -> 68 - suspect combination shock/bleeding and impella - hold heparin for now - check HIT  CRITICAL CARE Performed by: Arvilla Meres  Total critical care time: 50 minutes  Critical care time was exclusive of separately billable procedures and treating other patients.  Critical care was necessary to treat or prevent imminent or life-threatening deterioration.  Critical care was time spent personally by me (independent of midlevel providers or residents) on the following activities: development of treatment plan with patient and/or surrogate as well as nursing, discussions with consultants, evaluation of patient's response to treatment, examination of patient, obtaining history from patient or surrogate, ordering and performing treatments and interventions, ordering and review of laboratory studies, ordering and review of radiographic studies, pulse oximetry and re-evaluation of patient's condition.  Length of Stay: 2   Arvilla Meres MD 12/26/2022, 11:57 AM  Advanced Heart Failure Team Pager [phone] (M-F; 7a - 4p)  Please contact [facility] for night-coverage after hours (4p -7a ) and weekends on [email or web address]

## 2022-12-26 NOTE — Plan of Care (Signed)
  Problem: Clinical Measurements: Goal: Ability to maintain clinical measurements within normal limits will improve Outcome: Progressing Goal: Will remain free from infection Outcome: Progressing Goal: Diagnostic test results will improve Outcome: Progressing Goal: Respiratory complications will improve Outcome: Progressing Goal: Cardiovascular complication will be avoided Outcome: Progressing   Problem: Nutrition: Goal: Adequate nutrition will be maintained Outcome: Progressing   Problem: Coping: Goal: Level of anxiety will decrease Outcome: Progressing   Problem: Skin Integrity: Goal: Risk for impaired skin integrity will decrease Outcome: Progressing

## 2022-12-26 NOTE — Progress Notes (Signed)
eLink Physician-Brief Progress Note Patient Name: NAREK KNISS DOB: 18-Nov-1940 MRN: [id number]   Date of Service  12/26/2022  HPI/Events of Note  Anemia  eICU Interventions  PRBC   82 M Acute MI s/p PCI On heparin Impala  Anti platelets 2 hematoma (neck/groin) stable  Hgb went down from 9.3 to 7 Low CVP AKI improving Pulm edema with CHF on CXR (slight improvement in bilat interstitial infiltrate) On 1 mic of levophed  Will transfuse Low dose furosemide 20 mg after  Intervention Category Intermediate Interventions: Other:  [name] 12/26/2022, 3:59 AM

## 2022-12-26 NOTE — Progress Notes (Signed)
PHARMACY - ANTICOAGULATION CONSULT NOTE  Pharmacy Consult for heparin Indication: impella  Allergies  Allergen Reactions   Protamine Other (See Comments)    Severe hypotension    Patient Measurements: Height: 5\' 9"  (175.3 cm) Weight: 63.5 kg (139 lb 15.9 oz) IBW/kg (Calculated) : 70.7 Last weight 10/21/22 ~140lbs  Heparin Dosing Weight: ~63kg  Vital Signs: Temp: 99.1 F (37.3 C) (11/03 1230) Temp Source: Core (11/03 0715) BP: 105/58 (11/03 1405) Pulse Rate: 100 (11/03 1230)  Labs: Recent Labs    01/19/2023 1000 01/15/2023 1003 01/18/2023 1056 12/25/22 0323 12/25/22 0800 12/25/22 1700 12/26/22 0228 12/26/22 0639 12/26/22 1003  HGB 10.7* 11.6*   < > 9.3*  --   --  7.0*  --  8.3*  HCT 32.5* 34.0*   < > 28.2*  --   --  20.7*  --  24.8*  PLT 154  --    < > 122*  --   --  77*  --  68*  APTT 27  --   --   --   --   --   --   --   --   LABPROT 13.6  --   --   --   --   --   --   --   --   INR 1.0  --   --   --   --   --   --   --   --   HEPARINUNFRC  --   --    < >  --  <0.10* 0.15*  --  0.21*  --   CREATININE 1.05 0.80  --  1.34*  --   --  1.28*  --   --   TROPONINIHS 703*  --   --   --   --   --   --   --   --    < > = values in this interval not displayed.    Estimated Creatinine Clearance: 38.6 mL/min (A) (by C-G formula based on SCr of 1.28 mg/dL (H)).   Assessment: 82 yo M with STEMI s/p PCI c/b intraoperative hypotension requiring impella placement 11/1. No anticoagulation prior to admission. Pharmacy consulted for heparin.    Heparin level within goal range on infusion at 850 units/hr. No issues with line or bleeding reported per RN. Pt with remote groin hematoma post-procedure - no evidence of ongoing bleeding.  Hgb dropped this morning to 7 (previously 12.9 on admission)  Goal of Therapy:  Heparin level 0.2 -0.5 units/ml Monitor platelets by anticoagulation protocol: Yes   Plan:  Holding heparin for now until source of potential bleeding is found.  Reece Leader, Colon Flattery, BCCP Clinical Pharmacist  12/26/2022 3:10 PM   [facility] pharmacy phone numbers are listed on [email or web address]

## 2022-12-27 ENCOUNTER — Encounter (HOSPITAL_COMMUNITY): Payer: Self-pay | Admitting: Cardiology

## 2022-12-27 ENCOUNTER — Inpatient Hospital Stay (HOSPITAL_COMMUNITY): Payer: Medicare PPO

## 2022-12-27 DIAGNOSIS — I5021 Acute systolic (congestive) heart failure: Secondary | ICD-10-CM

## 2022-12-27 DIAGNOSIS — R570 Cardiogenic shock: Secondary | ICD-10-CM | POA: Diagnosis not present

## 2022-12-27 LAB — CBC
HCT: 26.3 % — ABNORMAL LOW (ref 39.0–52.0)
Hemoglobin: 8.6 g/dL — ABNORMAL LOW (ref 13.0–17.0)
MCH: 28.3 pg (ref 26.0–34.0)
MCHC: 32.7 g/dL (ref 30.0–36.0)
MCV: 86.5 fL (ref 80.0–100.0)
Platelets: 63 10*3/uL — ABNORMAL LOW (ref 150–400)
RBC: 3.04 MIL/uL — ABNORMAL LOW (ref 4.22–5.81)
RDW: 16.3 % — ABNORMAL HIGH (ref 11.5–15.5)
WBC: 7.5 10*3/uL (ref 4.0–10.5)
nRBC: 0 % (ref 0.0–0.2)

## 2022-12-27 LAB — MAGNESIUM: Magnesium: 2 mg/dL (ref 1.7–2.4)

## 2022-12-27 LAB — BASIC METABOLIC PANEL
Anion gap: 5 (ref 5–15)
BUN: 20 mg/dL (ref 8–23)
CO2: 22 mmol/L (ref 22–32)
Calcium: 8.2 mg/dL — ABNORMAL LOW (ref 8.9–10.3)
Chloride: 109 mmol/L (ref 98–111)
Creatinine, Ser: 1.12 mg/dL (ref 0.61–1.24)
GFR, Estimated: >60 mL/min (ref >60)
Glucose, Bld: 205 mg/dL — ABNORMAL HIGH (ref 70–99)
Potassium: 3.9 mmol/L (ref 3.5–5.1)
Sodium: 136 mmol/L (ref 135–145)

## 2022-12-27 LAB — GLUCOSE, CAPILLARY
Glucose-Capillary: 151 mg/dL — ABNORMAL HIGH (ref 70–99)
Glucose-Capillary: 282 mg/dL — ABNORMAL HIGH (ref 70–99)
Glucose-Capillary: 204 mg/dL — ABNORMAL HIGH (ref 70–99)
Glucose-Capillary: 179 mg/dL — ABNORMAL HIGH (ref 70–99)
Glucose-Capillary: 197 mg/dL — ABNORMAL HIGH (ref 70–99)

## 2022-12-27 LAB — TYPE AND SCREEN
ABO/RH(D): B NEG
Antibody Screen: NEGATIVE
Unit division: 0

## 2022-12-27 LAB — HEPARIN INDUCED PLATELET AB (HIT ANTIBODY): Heparin Induced Plt Ab: 0.058 {OD_unit} (ref 0.000–0.400)

## 2022-12-27 LAB — BPAM RBC
Blood Product Expiration Date: 202412052359
ISSUE DATE / TIME: 202411030505
Unit Type and Rh: 1700

## 2022-12-27 LAB — COOXEMETRY PANEL
Carboxyhemoglobin: 2.3 % — ABNORMAL HIGH (ref 0.5–1.5)
Carboxyhemoglobin: 2.4 % — ABNORMAL HIGH (ref 0.5–1.5)
Methemoglobin: <0.7 % (ref 0.0–1.5)
Methemoglobin: 1.3 % (ref 0.0–1.5)
O2 Saturation: 63.5 %
O2 Saturation: 64.1 %
Total hemoglobin: 8.9 g/dL — ABNORMAL LOW (ref 12.0–16.0)
Total hemoglobin: 8.5 g/dL — ABNORMAL LOW (ref 12.0–16.0)

## 2022-12-27 LAB — FIBRINOGEN: Fibrinogen: 415 mg/dL (ref 210–475)

## 2022-12-27 LAB — ECHOCARDIOGRAM LIMITED
Est EF: 25
Height: 69 in
Weight: 2165.8 [oz_av]

## 2022-12-27 LAB — LACTATE DEHYDROGENASE: LDH: 736 U/L — ABNORMAL HIGH (ref 98–192)

## 2022-12-27 MED ORDER — LACTATED RINGERS IV BOLUS
250.0000 mL | Freq: Once | INTRAVENOUS | Status: AC
  Administered 2022-12-27: 250 mL via INTRAVENOUS

## 2022-12-27 MED ORDER — DOCUSATE SODIUM 100 MG PO CAPS
100.0000 mg | ORAL_CAPSULE | Freq: Two times a day (BID) | ORAL | Status: DC
  Administered 2022-12-27 – 2022-12-28 (×2): 100 mg via ORAL
  Filled 2022-12-27 (×2): qty 1

## 2022-12-27 MED ORDER — POLYETHYLENE GLYCOL 3350 17 G PO PACK
17.0000 g | PACK | Freq: Every day | ORAL | Status: DC | PRN
  Administered 2022-12-27: 17 g via ORAL
  Filled 2022-12-27: qty 1

## 2022-12-27 MED ORDER — ROSUVASTATIN CALCIUM 20 MG PO TABS
40.0000 mg | ORAL_TABLET | Freq: Every day | ORAL | Status: DC
  Administered 2022-12-27 – 2022-12-29 (×3): 40 mg via ORAL
  Filled 2022-12-27 (×3): qty 2

## 2022-12-27 NOTE — Progress Notes (Addendum)
PHARMACY - ANTICOAGULATION CONSULT NOTE  Pharmacy Consult for heparin Indication: impella CP  Allergies  Allergen Reactions   Protamine Other (See Comments)    Severe hypotension    Patient Measurements: Height: 5\' 9"  (175.3 cm) Weight: 61.4 kg (135 lb 5.8 oz) IBW/kg (Calculated) : 70.7 Last weight 10/21/22 ~140lbs  Heparin Dosing Weight: ~63kg  Vital Signs: Temp: 99.3 F (37.4 C) (11/04 0700) BP: 127/80 (11/04 0700) Pulse Rate: 109 (11/04 0700)  Labs: Recent Labs    01/07/2023 1000 01/19/2023 1003 12/25/22 0323 12/25/22 0800 12/25/22 1700 12/26/22 0228 12/26/22 0639 12/26/22 1003 12/27/22 0354  HGB 10.7*   < > 9.3*  --   --  7.0*  --  8.3* 8.6*  HCT 32.5*   < > 28.2*  --   --  20.7*  --  24.8* 26.3*  PLT 154   < > 122*  --   --  77*  --  68* 63*  APTT 27  --   --   --   --   --   --   --   --   LABPROT 13.6  --   --   --   --   --   --   --   --   INR 1.0  --   --   --   --   --   --   --   --   HEPARINUNFRC  --    < >  --  <0.10* 0.15*  --  0.21*  --   --   CREATININE 1.05   < > 1.34*  --   --  1.28*  --   --  1.12  TROPONINIHS 703*  --   --   --   --   --   --   --   --    < > = values in this interval not displayed.    Estimated Creatinine Clearance: 42.6 mL/min (by C-G formula based on SCr of 1.12 mg/dL).   Assessment: 82 yo M with STEMI s/p PCI c/b intraoperative hypotension requiring impella placement 11/1. No anticoagulation prior to admission. Pharmacy consulted for heparin.    Hgb up to 8.6 after 1 PRBC yesterday. Plt went down to 63. Fibrinogen 415. LDH 736. No issues with line or bleeding reported per RN. Pt with remote groin hematoma post-procedure - no evidence of ongoing bleeding. Remains on sodium bicarb purge, currently running on P7 > dropped down to P3 later this morning.   Goal of Therapy:  Heparin level 0.2 -0.5 units/ml Monitor platelets by anticoagulation protocol: Yes   Plan:  Holding heparin for now  Monitor CBC and for s/sx of  bleeding before restarting   Thank you for allowing pharmacy to participate in this patient's care,  Sherron Monday, PharmD, BCCCP Clinical Pharmacist  Phone: [phone] 12/27/2022 7:28 AM  Please check AMION for all [facility] Pharmacy phone numbers After 10:00 PM, call Main Pharmacy [phone]

## 2022-12-27 NOTE — Progress Notes (Signed)
   Impella weaned to P3 Flow 2.4 . Remains on mlrinone 0.125 mcg.   CVP remains low 1-2   Swan #s Co 6 CI 3.4.   No change. Anticipate removing Impella tomorrow if he remains stable over night.   Jhase Creppel NP-C  3:18 PM

## 2022-12-27 NOTE — Progress Notes (Addendum)
Advanced Heart Failure Rounding Note   Subjective:    11/1 anterior/inferior STEMI d/t acute LAD Occlusion (RCA and LCX 100% CTO). LAD PCI. -> shock -> impella 11/1 Echo EF < 20 RV mildly down  11/3 Given 1UPRBC  Remains on Impella at P-7 Milrinone 0.125 . CO-OX 63%   Flow 3.3 on Impella. LDH 954>736  #Swan CVP 1 PA 15/5 (8)  CO 5.7 CI 3.2   No complaints. Denies SOB.  Objective:   Weight Range:  Vital Signs:   Temp:  [98.8 F (37.1 C)-99.9 F (37.7 C)] 99.3 F (37.4 C) (11/04 0700) Pulse Rate:  [47-109] 109 (11/04 0700) Resp:  [9-28] 19 (11/04 0700) BP: (102-127)/(58-93) 127/80 (11/04 0700) SpO2:  [87 %-98 %] 96 % (11/04 0700) Weight:  [61.4 kg] 61.4 kg (11/04 0500) Last BM Date : 12/25/22  Weight change: Filed Weights   01/01/2023 2000 12/26/22 0600 12/27/22 0500  Weight: 60.8 kg 63.5 kg 61.4 kg    Intake/Output:   Intake/Output Summary (Last 24 hours) at 12/27/2022 0724 Last data filed at 12/27/2022 0700 Gross per 24 hour  Intake 637.21 ml  Output 3745 ml  Net -3107.79 ml     Physical Exam: General: Thin, no resp difficulty. Impella  HEENT: normal Neck: supple. no JVD. Carotids 2+ bilat; no bruits. No lymphadenopathy or thryomegaly appreciated. RIJ  Cor: PMI nondisplaced. Regular rate & rhythm. No rubs, gallops or murmurs. Lungs: clear Abdomen: soft, nontender, nondistended. No hepatosplenomegaly. No bruits or masses. Good bowel sounds. Extremities: no cyanosis, clubbing, rash, edema/ Impella CP no bleeding.   Neuro: alert & orientedx3, cranial nerves grossly intact. moves all 4 extremities w/o difficulty. Affect pleasant  Telemetry: SR 90s   Labs: Basic Metabolic Panel: Recent Labs  Lab 01/11/2023 1000 01/14/2023 1003 01/16/2023 1056 01/02/2023 1141 01/14/2023 1148 12/25/22 0323 12/26/22 0228 12/27/22 0354  NA 135 138   < > 136 136 134* 136 136  K 3.7 3.8   < > 4.0 4.3 3.7 3.8 3.9  CL 106 107  --   --   --  102 107 109  CO2 16*  --   --   --    --  20* 23 22  GLUCOSE 286* 284*  --   --   --  291* 274* 205*  BUN 18 17  --   --   --  29* 24* 20  CREATININE 1.05 0.80  --   --   --  1.34* 1.28* 1.12  CALCIUM 8.6*  --   --   --   --  8.2* 8.0* 8.2*  MG  --   --   --   --   --  1.7 2.1 2.0   < > = values in this interval not displayed.    Liver Function Tests: Recent Labs  Lab 01/18/2023 1000  AST 25  ALT 21  ALKPHOS 46  BILITOT 0.7  PROT 6.1*  ALBUMIN 3.2*   No results for input(s): "LIPASE", "AMYLASE" in the last 168 hours. No results for input(s): "AMMONIA" in the last 168 hours.  CBC: Recent Labs  Lab 01/22/2023 1000 01/14/2023 1003 12/26/2022 1812 12/25/22 0323 12/26/22 0228 12/26/22 1003 12/27/22 0354  WBC 7.2   < > 8.1 8.4 8.7 8.3 7.5  NEUTROABS 5.4  --   --   --   --   --   --   HGB 10.7*   < > 10.1* 9.3* 7.0* 8.3* 8.6*  HCT 32.5*   < >  31.1* 28.2* 20.7* 24.8* 26.3*  MCV 84.9   < > 87.4 84.2 85.2 83.2 86.5  PLT 154   < > 159 122* 77* 68* 63*   < > = values in this interval not displayed.    Cardiac Enzymes: No results for input(s): "CKTOTAL", "CKMB", "CKMBINDEX", "TROPONINI" in the last 168 hours.  BNP: BNP (last 3 results) No results for input(s): "BNP" in the last 8760 hours.  ProBNP (last 3 results) No results for input(s): "PROBNP" in the last 8760 hours.    Other results:  Imaging: CT CHEST ABDOMEN PELVIS WO CONTRAST  Result Date: 12/26/2022 CLINICAL DATA:  Retroperitoneal bleed EXAM: CT CHEST, ABDOMEN AND PELVIS WITHOUT CONTRAST TECHNIQUE: Multidetector CT imaging of the chest, abdomen and pelvis was performed following the standard protocol without IV contrast. RADIATION DOSE REDUCTION: This exam was performed according to the departmental dose-optimization program which includes automated exposure control, adjustment of the mA and/or kV according to patient size and/or use of iterative reconstruction technique. COMPARISON:  Chest radiograph 1 day prior FINDINGS: CT CHEST FINDINGS Cardiovascular:  An Impella device is in place terminating in the left ventricle. A right IJ Swan-Ganz catheter is in place terminating in the right pulmonary artery. There are extensive coronary artery calcifications and mild calcified plaque throughout the thoracic aorta. The heart size is normal. There is no pericardial effusion. Mediastinum/Nodes: The thyroid is unremarkable. The esophagus is grossly unremarkable. There is no mediastinal or axillary lymphadenopathy. There is no bulky hilar adenopathy, within the confines of noncontrast technique. Lungs/Pleura: The trachea and central airways are patent. There are small bilateral pleural effusions with adjacent airspace opacities. There is no overt edema. There is no pneumothorax. There are no suspicious nodules. Musculoskeletal: There is no acute osseous abnormality or suspicious osseous lesion. There is a probable benign intraosseous hemangioma in the T11 vertebral body. CT ABDOMEN PELVIS FINDINGS Hepatobiliary: The liver is unremarkable. Layering hyperdensity in the gallbladder likely reflects vicarious excretion of contrast. There is no biliary ductal dilatation. Pancreas: Grossly unremarkable. Spleen: Unremarkable. Adrenals/Urinary Tract: The adrenals are unremarkable. There is retained contrast in the kidneys. There are no suspicious renal lesions. There are no stones in either kidney or along the course of either ureter. There is no hydronephrosis or hydroureter. A Foley catheter is in place in the bladder accounting for the anti dependent gas. Stomach/Bowel: The stomach is unremarkable. There is no evidence of bowel obstruction. There is no abnormal bowel wall thickening or inflammatory change. There is moderate stool throughout the colon. The appendix is normal. There is colonic diverticulosis without evidence of acute diverticulitis. Vascular/Lymphatic: There is extensive calcified plaque throughout the nonaneurysmal abdominal aorta. There is no abdominal or pelvic  lymphadenopathy. Reproductive: The prostate is enlarged. Other: There is no ascites or free intraperitoneal air. There is no retroperitoneal hematoma. There is no right groin hematoma. Musculoskeletal: There is a small lipoma in the left gluteal musculature. There is no acute osseous abnormality or suspicious osseous lesion. There is advanced disc degeneration at L4-L5. IMPRESSION: 1. No retroperitoneal hematoma, right groin hematoma, or other acute finding in the abdomen or pelvis. 2. Small bilateral pleural effusions with adjacent airspace opacity which may reflect atelectasis, infection, or combination thereof. 3. Colonic diverticulosis without evidence of acute diverticulitis. 4. Persistent contrast in the kidneys suggesting acute renal injury. 5. Impella device terminating in the left ventricle and Swan-Ganz catheter terminating in the right pulmonary artery. Electronically Signed   By: Lesia Hausen M.D.   On: 12/26/2022 11:07  Medications:     Scheduled Medications:  aspirin EC  81 mg Oral Daily   Chlorhexidine Gluconate Cloth  6 each Topical Daily   digoxin  0.0625 mg Oral Daily   hydrALAZINE  10 mg Oral Q8H   insulin aspart  0-15 Units Subcutaneous TID WC   insulin glargine-yfgn  14 Units Subcutaneous BID   melatonin  3 mg Oral QHS   pantoprazole  40 mg Oral Daily   rosuvastatin  20 mg Oral Daily   sertraline  50 mg Oral Daily   sodium chloride flush  3 mL Intravenous Q12H   tamsulosin  0.4 mg Oral QPC supper   ticagrelor  90 mg Oral BID    Infusions:  epinephrine     milrinone 0.125 mcg/kg/min (12/27/22 0700)   norepinephrine (LEVOPHED) Adult infusion Stopped (12/26/22 0358)   sodium bicarbonate 25 mEq (Impella PURGE) in dextrose 5 % 1000 mL bag      PRN Medications: sodium chloride, acetaminophen, lidocaine-prilocaine, ondansetron (ZOFRAN) IV, mouth rinse, sodium chloride flush   Assessment/Plan:   1. Anterior STEMI>>Cardiogenic shock -Echo 4/18 EF 55-60%, -  Presented with STEMI 11/1. LHC: LM: Normal. LAD: 80% calcific proximal/mid LAD disease, Mid 100% occlusion (culprit lesion) Distal apical 80% stenosis. LCX 100% pCTO, RCA 100% mCTO with L->L and L-> R collats. s/p mid LAD overlap DES - Echo 11/1 EF < 20 RV mild HK - POCUS 11/2 EF 25-30% RV ok  - Impella CP placed for hemodynamic support with CGS, currently on P8 - Failed Impella wean to P-6 on 11/2 but now appears he was also bleeding. CT C/A/P 11/3 no bleed.  - On Impella P7 + Milrinone 0.125 mcg.  CO-OX stable. CVP 1 CO 5.7 CI 3.2  - Give 250 cc LR bolus now.  - Attempt to wean Impella to P6. --> Flow 3 - Hold diuretics/SGT2 for now.  - Stop hydralazine, can use spironolactone 12.5 daily.  - Continue Brilinta and statin - Continue digoxin.  2. 3v CAD now s/p anterior STEMI - s/p PCI/overlapping DES mLAD.  Had chronically occluded RCA and LCx with collaterals.  - continue DAPT/statin.  - No chest pain.   3.Acute respiratory failure - required NRB in lab 2/2 desaturations - now on RA - PCCM has signed off   4. Acute blood loss anemia with L groin hematoma - post-procedure. - no evidence of ongoing bleeding on exam. CT C/A/P with no RP bleed.  - Has received 1u RBC -> hgb 8.6, stable.  - Will follow. 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 Thrombocytopenia  - PLTS 122 -> 77 -> 68->63  - suspect combination shock/bleeding and impella - hold heparin for now. Sodium Bicarb in purge.  -HIT pending.  - Impella site ok. No bleeding.   Length of Stay: 3   Amy Clegg NP-C  12/27/2022, 7:24 AM  Advanced Heart Failure Team Pager [phone] (M-F; 7a - 4p)  Please contact [facility] for night-coverage after hours (4p -7a ) and weekends on [email or web address]  Patient seen with NP, agree with the above note.   Stable this morning on Impella P6 (turned down early am from P7) and milrinone 0.125.  CI 3.2 from [place], co-ox 64%.  CVP 2 on my  read. I/Os net negative 2682.   No chest pain or dyspnea, remains in NSR.    Hgb mildly higher at 8.6, no overt bleeding noted.   General: NAD Neck:  No JVD, no thyromegaly or thyroid nodule.  Lungs: Clear to auscultation bilaterally with normal respiratory effort. CV: Nondisplaced PMI.  Heart regular S1/S2, no S3/S4, 2/6 HSM LLSB/apex.  No peripheral edema.  Pedal pulses dopplerable.  Abdomen: Soft, nontender, no hepatosplenomegaly, no distention.  Skin: Intact without lesions or rashes.  Neurologic: Alert and oriented x 3.  Psych: Normal affect. Extremities: No clubbing or cyanosis.  HEENT: Normal.   Good cardiac output this morning and low filling pressures.  - He does not need Lasix, agree with gentle IVF with low CVP.  - Continue milrinone 0.125 mcg/kg/min.   - Will decrease Impella again to P5 and repeat co-ox in an hour or so (flow 2.8 L/min on P5).  If he does well, can continue to slowly decrease support with goal to possibly remove Impella tomorrow.  - Echo for Impella position and LV/RV function.  - Continue digoxin.  - Would stop hydralazine for now.  - Low dose spironolactone 12.5 daily.  - No heparin with concern for possible bleeding, running HCO3 in purge.  LDH trending down.   No evidence for overt bleeding at this time, hgb higher at 8.6.  CT abd/pelvis without RP bleeding yesterday.   Continue ASA/Brilinta + statin post-PCI.   CRITICAL CARE Performed by: Marca Ancona  Total critical care time: 45 minutes  Critical care time was exclusive of separately billable procedures and treating other patients.  Critical care was necessary to treat or prevent imminent or life-threatening deterioration.  Critical care was time spent personally by me on the following activities: development of treatment plan with patient and/or surrogate as well as nursing, discussions with consultants, evaluation of patient's response to treatment, examination of patient, obtaining  history from patient or surrogate, ordering and performing treatments and interventions, ordering and review of laboratory studies, ordering and review of radiographic studies, pulse oximetry and re-evaluation of patient's condition.  Marca Ancona 12/27/2022 8:19 AM

## 2022-12-27 NOTE — Plan of Care (Signed)
  Problem: Education: Goal: Knowledge of General Education information will improve Description: Including pain rating scale, medication(s)/side effects and non-pharmacologic comfort measures Outcome: Progressing   Problem: Health Behavior/Discharge Planning: Goal: Ability to manage health-related needs will improve Outcome: Progressing   Problem: Clinical Measurements: Goal: Ability to maintain clinical measurements within normal limits will improve Outcome: Progressing Goal: Diagnostic test results will improve Outcome: Progressing Goal: Cardiovascular complication will be avoided Outcome: Progressing   Problem: Activity: Goal: Risk for activity intolerance will decrease Outcome: Progressing   Problem: Safety: Goal: Ability to remain free from injury will improve Outcome: Progressing   Problem: Education: Goal: Understanding of CV disease, CV risk reduction, and recovery process will improve Outcome: Progressing   Problem: Cardiovascular: Goal: Ability to achieve and maintain adequate cardiovascular perfusion will improve Outcome: Progressing   Problem: Cardiac: Goal: Ability to achieve and maintain adequate cardiopulmonary perfusion will improve Outcome: Progressing Goal: Vascular access site(s) Level 0-1 will be maintained Outcome: Progressing

## 2022-12-28 DIAGNOSIS — R570 Cardiogenic shock: Secondary | ICD-10-CM | POA: Diagnosis not present

## 2022-12-28 DIAGNOSIS — E43 Unspecified severe protein-calorie malnutrition: Secondary | ICD-10-CM | POA: Insufficient documentation

## 2022-12-28 LAB — COOXEMETRY PANEL
Carboxyhemoglobin: 1.1 % (ref 0.5–1.5)
Carboxyhemoglobin: 1.9 % — ABNORMAL HIGH (ref 0.5–1.5)
Methemoglobin: <0.7 % (ref 0.0–1.5)
Methemoglobin: <0.7 % (ref 0.0–1.5)
O2 Saturation: 59.2 %
O2 Saturation: 65.1 %
Total hemoglobin: 9.2 g/dL — ABNORMAL LOW (ref 12.0–16.0)
Total hemoglobin: 8.8 g/dL — ABNORMAL LOW (ref 12.0–16.0)

## 2022-12-28 LAB — CBC
HCT: 26.4 % — ABNORMAL LOW (ref 39.0–52.0)
Hemoglobin: 8.6 g/dL — ABNORMAL LOW (ref 13.0–17.0)
MCH: 27.8 pg (ref 26.0–34.0)
MCHC: 32.6 g/dL (ref 30.0–36.0)
MCV: 85.4 fL (ref 80.0–100.0)
Platelets: 60 10*3/uL — ABNORMAL LOW (ref 150–400)
RBC: 3.09 MIL/uL — ABNORMAL LOW (ref 4.22–5.81)
RDW: 16.2 % — ABNORMAL HIGH (ref 11.5–15.5)
WBC: 6.7 10*3/uL (ref 4.0–10.5)
nRBC: 0 % (ref 0.0–0.2)

## 2022-12-28 LAB — GLUCOSE, CAPILLARY
Glucose-Capillary: 135 mg/dL — ABNORMAL HIGH (ref 70–99)
Glucose-Capillary: 157 mg/dL — ABNORMAL HIGH (ref 70–99)
Glucose-Capillary: 184 mg/dL — ABNORMAL HIGH (ref 70–99)
Glucose-Capillary: 249 mg/dL — ABNORMAL HIGH (ref 70–99)

## 2022-12-28 LAB — BASIC METABOLIC PANEL
Anion gap: 3 — ABNORMAL LOW (ref 5–15)
BUN: 16 mg/dL (ref 8–23)
CO2: 23 mmol/L (ref 22–32)
Calcium: 8.1 mg/dL — ABNORMAL LOW (ref 8.9–10.3)
Chloride: 110 mmol/L (ref 98–111)
Creatinine, Ser: 0.98 mg/dL (ref 0.61–1.24)
GFR, Estimated: >60 mL/min (ref >60)
Glucose, Bld: 162 mg/dL — ABNORMAL HIGH (ref 70–99)
Potassium: 4 mmol/L (ref 3.5–5.1)
Sodium: 136 mmol/L (ref 135–145)

## 2022-12-28 LAB — FIBRINOGEN: Fibrinogen: 422 mg/dL (ref 210–475)

## 2022-12-28 LAB — MAGNESIUM: Magnesium: 2.1 mg/dL (ref 1.7–2.4)

## 2022-12-28 LAB — LACTATE DEHYDROGENASE: LDH: 566 U/L — ABNORMAL HIGH (ref 98–192)

## 2022-12-28 MED ORDER — POLYETHYLENE GLYCOL 3350 17 G PO PACK
17.0000 g | PACK | Freq: Every day | ORAL | Status: DC
  Administered 2022-12-28 – 2022-12-29 (×2): 17 g via ORAL
  Filled 2022-12-28 (×2): qty 1

## 2022-12-28 MED ORDER — ADULT MULTIVITAMIN W/MINERALS CH
1.0000 | ORAL_TABLET | Freq: Every day | ORAL | Status: DC
  Administered 2022-12-28 – 2022-12-29 (×2): 1 via ORAL
  Filled 2022-12-28 (×2): qty 1

## 2022-12-28 MED ORDER — LACTATED RINGERS IV BOLUS
250.0000 mL | Freq: Once | INTRAVENOUS | Status: AC
  Administered 2022-12-28: 250 mL via INTRAVENOUS

## 2022-12-28 MED ORDER — GLUCERNA SHAKE PO LIQD
237.0000 mL | Freq: Three times a day (TID) | ORAL | Status: DC
  Administered 2022-12-28 – 2022-12-29 (×4): 237 mL via ORAL

## 2022-12-28 MED ORDER — SENNA 8.6 MG PO TABS
1.0000 | ORAL_TABLET | Freq: Two times a day (BID) | ORAL | Status: DC
  Administered 2022-12-28 – 2022-12-29 (×2): 8.6 mg via ORAL
  Filled 2022-12-28 (×2): qty 1

## 2022-12-28 NOTE — Progress Notes (Addendum)
Impella removed from right femoral artery, manual pressure applied for 55 minutes. Site level 0, slight hematoma was present prior to removal. No hematoma present post groin hold.  Tegaderm dressing applied, bedrest instuctions given.  Right dp and pt pulses present with doppler.    Bedrest begins at 10:00:00

## 2022-12-28 NOTE — Plan of Care (Signed)
  Problem: Education: Goal: Knowledge of General Education information will improve Description: Including pain rating scale, medication(s)/side effects and non-pharmacologic comfort measures Outcome: Progressing   Problem: Clinical Measurements: Goal: Ability to maintain clinical measurements within normal limits will improve Outcome: Progressing   

## 2022-12-28 NOTE — Discharge Instructions (Signed)

## 2022-12-28 NOTE — Progress Notes (Addendum)
PHARMACY - ANTICOAGULATION CONSULT NOTE  Pharmacy Consult for heparin Indication: impella CP  Allergies  Allergen Reactions   Protamine Other (See Comments)    Severe hypotension    Patient Measurements: Height: 5\' 9"  (175.3 cm) Weight: 61.6 kg (135 lb 12.9 oz) IBW/kg (Calculated) : 70.7 Last weight 10/21/22 ~140lbs  Heparin Dosing Weight: ~63kg  Vital Signs: Temp: 99.1 F (37.3 C) (11/05 0800) Temp Source: Core (11/05 0800) BP: 105/68 (11/05 0800) Pulse Rate: 92 (11/05 0800)  Labs: Recent Labs    12/25/22 1700 12/26/22 0228 12/26/22 0228 12/26/22 0639 12/26/22 1003 12/27/22 0354 12/28/22 0352  HGB  --  7.0*   < >  --  8.3* 8.6* 8.6*  HCT  --  20.7*   < >  --  24.8* 26.3* 26.4*  PLT  --  77*   < >  --  68* 63* 60*  HEPARINUNFRC 0.15*  --   --  0.21*  --   --   --   CREATININE  --  1.28*  --   --   --  1.12 0.98   < > = values in this interval not displayed.    Estimated Creatinine Clearance: 48.9 mL/min (by C-G formula based on SCr of 0.98 mg/dL).   Assessment: 82 yo M with STEMI s/p PCI c/b intraoperative hypotension requiring impella placement 11/1. No anticoagulation prior to admission. Pharmacy consulted for heparin.    Hgb up to 8.6, plt went down to 60. Fibrinogen 422. LDH down to 566. No issues with line or bleeding reported per RN. Pt with remote groin hematoma post-procedure - no evidence of ongoing bleeding. Remains on sodium bicarb purge - currently running at P3. HIT antibody came back negative at 0.058.  Goal of Therapy:  Heparin level 0.2 -0.5 units/ml Monitor platelets by anticoagulation protocol: Yes   Plan:  No plans for heparin with impella removed 11/5 - will plan for DVT ppx when appropriate Monitor CBC and for s/sx of bleeding  Thank you for allowing pharmacy to participate in this patient's care,  Sherron Monday, PharmD, BCCCP Clinical Pharmacist  Phone: [phone] 12/28/2022 8:04 AM  Please check AMION for all [facility] Pharmacy phone  numbers After 10:00 PM, call Main Pharmacy [phone]

## 2022-12-28 NOTE — Progress Notes (Signed)
Patient ID: Jeremy Patton, male   DOB: 1940/11/09, 82 y.o.   MRN: [id number]  Patient tolerated Impella P2 without hemodynamic change.  Flow decreased to P1 and device pulled across valve.  Device turned off and removed from body.  Pressure hold by Virl Diamond from cath lab.   Marca Ancona 12/28/2022 9:02 AM

## 2022-12-28 NOTE — Progress Notes (Signed)
Initial Nutrition Assessment  DOCUMENTATION CODES:   Severe malnutrition in context of chronic illness  INTERVENTION:  Continue carb modified diet to aid in blood sugar control Protein containing snacks TID between meals Glucerna Shake po TID, each supplement provides 220 kcal and 10 grams of protein MVI with minerals daily "High Calorie, High Protein Nutrition Therapy" handout added to AVS  NUTRITION DIAGNOSIS:   Severe Malnutrition related to chronic illness as evidenced by severe fat depletion, severe muscle depletion, moderate muscle depletion.  GOAL:   Patient will meet greater than or equal to 90% of their needs   MONITOR:   PO intake, Labs, Weight trends  REASON FOR ASSESSMENT:   Consult Assessment of nutrition requirement/status (Patient states he has lost "several pounds" over the last month.)  ASSESSMENT:   Pt admitted with chest pain d/t STEMI and cardiogenic shock. PMH significant for HTN, T2DM, L carotid endarterectomy.  11/1 s/p cardiac cath, found to have LAD stenosis, s/p DES and impella placement 11/5 s/p impella removal  Pt initially sleeping upon arrival to room. Most nutrition history obtained from pt's wife. She states that he has a good appetite and denies significant changes to his baseline PO intake.   Breakfast is his biggest meal of the day which typically includes 2 eggs, 2 pieces of bacon, 2 eggs, occasionally grits and coffee. Throughout the rest of the day he snacks on items such as peanut butter crackers, pistachios, and small portion meals that may include a meat and vegetable. He does not consume nutrition supplements. Denies difficulty chewing/swallowing.   Meal completions: 11/3: 50% breakfast, 75% lunch 11/4: 75% breakfast  Pt's wife states that he weighs himself daily. She is uncertain how much weight he has lost but has noticed a change in weight and his clothes fitting more loosely.  Pt awoke for nutrition focused physical exam.  He states that he has noticed his weight on the scale has been declining over the last 6 months. He states that he used to weigh about 150 lbs and is now down to about 125 lbs. Review of documented weight history reflects pt to have had a weight loss of 4.8% over the last 4 months which is not clinically significant for time frame.   Pt and wife question whether insulin has been contributing to new weight loss given appetite/PO intake remaining stable. They deny any new changes to insulin regimen. He utilizes a continuous glucose monitor to monitor his blood sugars.   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   Medications: colace BID, SSI 0-15 units TID, semglee 14 units BID, melatonin, protonix  Labs: CBG's 135-282 x24 hours, HgbA1c 8.2%  UOP: x24 hours I/O's: - since admit  NUTRITION - FOCUSED PHYSICAL EXAM:  Flowsheet Row Most Recent Value  Orbital Region Severe depletion  Upper Arm Region Severe depletion  Thoracic and Lumbar Region Severe depletion  Buccal Region Severe depletion  Temple Region Severe depletion  Clavicle Bone Region Severe depletion  Clavicle and Acromion Bone Region Moderate depletion  Scapular Bone Region Moderate depletion  Dorsal Hand Moderate depletion  Patellar Region Moderate depletion  [unable to assess R leg]  Anterior Thigh Region Moderate depletion  Posterior Calf Region Mild depletion  Edema (RD Assessment) None  Hair Reviewed  Eyes Reviewed  Mouth Reviewed  Skin Reviewed  Nails Reviewed       Diet Order:   Diet Order  Diet Carb Modified Fluid consistency: Thin; Room service appropriate? Yes  Diet effective now                   EDUCATION NEEDS:   Education needs have been addressed  Skin:  Skin Assessment: Reviewed RN Assessment  Last BM:  11/2  Height:   Ht Readings from Last 1 Encounters:  01/05/2023 5\' 9"  (1.753 m)     Weight:   Wt Readings from Last 1 Encounters:  12/28/22 61.6 kg   BMI:  Body mass index is 20.05 kg/m.  Estimated Nutritional Needs:   Kcal:  1800-2000  Protein:  90-105g  Fluid:  >/=1.8L  Drusilla Kanner, RDN, LDN Clinical Nutrition

## 2022-12-28 NOTE — Progress Notes (Addendum)
Advanced Heart Failure Rounding Note   Subjective:    11/1 anterior/inferior STEMI d/t acute LAD Occlusion (RCA and LCX 100% CTO). LAD PCI. -> shock -> impella 11/1 Echo EF < 20 RV mildly down  11/3 Given 1UPRBC 11/4 limited echo: EF 25%, RV normal   Remains on Impella at P-3 Milrinone 0.125 . CO-OX 59%   Flow 2.4 on Impella. LDH [phone]  CVP 1. Drinking lots of fluid per patient report. Auto diuresing, -3.1L UOP  #Swan CVP 1 PA 14/4 (8)  CO 5.62 CI 3.16  Resting comfortably in bed. Denies CP/SOB.  Objective:   Weight Range:  Vital Signs:   Temp:  [98.6 F (37 C)-99.9 F (37.7 C)] 99 F (37.2 C) (11/05 0700) Pulse Rate:  [86-101] 101 (11/05 0700) Resp:  [8-25] 16 (11/05 0700) BP: (99-136)/(56-79) 114/73 (11/05 0700) SpO2:  [95 %-99 %] 97 % (11/05 0700) Weight:  [61.6 kg] 61.6 kg (11/05 0500) Last BM Date : 12/25/22  Weight change: Filed Weights   12/26/22 0600 12/27/22 0500 12/28/22 0500  Weight: 63.5 kg 61.4 kg 61.6 kg    Intake/Output:   Intake/Output Summary (Last 24 hours) at 12/28/2022 0748 Last data filed at 12/28/2022 0700 Gross per 24 hour  Intake 650.61 ml  Output 3195 ml  Net -2544.39 ml     Physical Exam: General:  elderly appearing.  No respiratory difficulty HEENT: normal Neck: supple. JVD flat. Carotids 2+ bilat; no bruits. No lymphadenopathy or thyromegaly appreciated. SwannRIJ Cor: PMI nondisplaced. Regular rate & rhythm. No rubs, gallops or murmurs. Lungs: clear Abdomen: soft, nontender, nondistended. No hepatosplenomegaly. No bruits or masses. Good bowel sounds. Extremities: no cyanosis, clubbing, rash, edema. R groin site intact GU: +foley Neuro: alert & oriented x 3, cranial nerves grossly intact. moves all 4 extremities w/o difficulty. Affect pleasant.   Telemetry: NSR 90s, intt PVCs (Personally reviewed)    Labs: Basic Metabolic Panel: Recent Labs  Lab 12/28/2022 1000 01/06/2023 1003 12/28/2022 1056 01/05/2023 1148  12/25/22 0323 12/26/22 0228 12/27/22 0354 12/28/22 0352  NA 135 138   < > 136 134* 136 136 136  K 3.7 3.8   < > 4.3 3.7 3.8 3.9 4.0  CL 106 107  --   --  102 107 109 110  CO2 16*  --   --   --  20* 23 22 23   GLUCOSE 286* 284*  --   --  291* 274* 205* 162*  BUN 18 17  --   --  29* 24* 20 16  CREATININE 1.05 0.80  --   --  1.34* 1.28* 1.12 0.98  CALCIUM 8.6*  --   --   --  8.2* 8.0* 8.2* 8.1*  MG  --   --   --   --  1.7 2.1 2.0 2.1   < > = values in this interval not displayed.    Liver Function Tests: Recent Labs  Lab 01/12/2023 1000  AST 25  ALT 21  ALKPHOS 46  BILITOT 0.7  PROT 6.1*  ALBUMIN 3.2*   No results for input(s): "LIPASE", "AMYLASE" in the last 168 hours. No results for input(s): "AMMONIA" in the last 168 hours.  CBC: Recent Labs  Lab 01/10/2023 1000 01/08/2023 1003 12/25/22 0323 12/26/22 0228 12/26/22 1003 12/27/22 0354 12/28/22 0352  WBC 7.2   < > 8.4 8.7 8.3 7.5 6.7  NEUTROABS 5.4  --   --   --   --   --   --  HGB 10.7*   < > 9.3* 7.0* 8.3* 8.6* 8.6*  HCT 32.5*   < > 28.2* 20.7* 24.8* 26.3* 26.4*  MCV 84.9   < > 84.2 85.2 83.2 86.5 85.4  PLT 154   < > 122* 77* 68* 63* 60*   < > = values in this interval not displayed.    Cardiac Enzymes: No results for input(s): "CKTOTAL", "CKMB", "CKMBINDEX", "TROPONINI" in the last 168 hours.  BNP: BNP (last 3 results) No results for input(s): "BNP" in the last 8760 hours.  ProBNP (last 3 results) No results for input(s): "PROBNP" in the last 8760 hours.    Other results:  Imaging: ECHOCARDIOGRAM LIMITED  Result Date: 12/27/2022    ECHOCARDIOGRAM LIMITED REPORT   Patient Name:   Jeremy Patton Date of Exam: 12/27/2022 Medical Rec #:  [id number]           Height:       69.0 in Accession #:    [id number]          Weight:       135.4 lb Date of Birth:  [birth year]           BSA:          1.750 m Patient Age:    82 years            BP:           115/75 mmHg Patient Gender: M                   HR:            86 bpm. Exam Location:  Inpatient Procedure: Limited Echo and Color Doppler Indications:    I50.21 Acute systolic (congestive) heart failure  History:        Patient has prior history of Echocardiogram examinations, most                 recent 01/07/2023. CHF; Risk Factors:Hypertension, Diabetes and                 Dyslipidemia.  Sonographer:    Irving Burton Senior RDCS Referring Phys: [phone] [facility]  Sonographer Comments: Impella check, Dr. Shirlee Latch at bedside IMPRESSIONS  1. Impella well postioned. Inflow port about 4.6 cm from AoV. Left ventricular ejection fraction, by estimation, is 25%.  2. Right ventricular systolic function is normal.  3. The mitral valve is normal in structure. Mild mitral valve regurgitation.  4. The aortic valve was not well visualized.  5. The inferior vena cava is normal in size with greater than 50% respiratory variability, suggesting right atrial pressure of 3 mmHg. Conclusion(s)/Recommendation(s): Limited echo for LV function and Impella placement. FINDINGS  Left Ventricle: Impella well postioned. Inflow port about 4.6 cm from AoV. Left ventricular ejection fraction, by estimation, is 25%. Right Ventricle: Right ventricular systolic function is normal. Pericardium: There is no evidence of pericardial effusion. Mitral Valve: The mitral valve is normal in structure. There is mild thickening of the mitral valve leaflet(s). Mild mitral valve regurgitation. Tricuspid Valve: The tricuspid valve is grossly normal. Aortic Valve: The aortic valve was not well visualized. Venous: The inferior vena cava is normal in size with greater than 50% respiratory variability, suggesting right atrial pressure of 3 mmHg. Additional Comments: Color Doppler performed.  Arvilla Meres MD Electronically signed by Arvilla Meres MD Signature Date/Time: 12/27/2022/11:20:33 AM    Final    CT CHEST ABDOMEN PELVIS WO CONTRAST  Result Date: 12/26/2022 CLINICAL DATA:  Retroperitoneal bleed EXAM: CT CHEST, ABDOMEN  AND PELVIS WITHOUT CONTRAST TECHNIQUE: Multidetector CT imaging of the chest, abdomen and pelvis was performed following the standard protocol without IV contrast. RADIATION DOSE REDUCTION: This exam was performed according to the departmental dose-optimization program which includes automated exposure control, adjustment of the mA and/or kV according to patient size and/or use of iterative reconstruction technique. COMPARISON:  Chest radiograph 1 day prior FINDINGS: CT CHEST FINDINGS Cardiovascular: An Impella device is in place terminating in the left ventricle. A right IJ Swan-Ganz catheter is in place terminating in the right pulmonary artery. There are extensive coronary artery calcifications and mild calcified plaque throughout the thoracic aorta. The heart size is normal. There is no pericardial effusion. Mediastinum/Nodes: The thyroid is unremarkable. The esophagus is grossly unremarkable. There is no mediastinal or axillary lymphadenopathy. There is no bulky hilar adenopathy, within the confines of noncontrast technique. Lungs/Pleura: The trachea and central airways are patent. There are small bilateral pleural effusions with adjacent airspace opacities. There is no overt edema. There is no pneumothorax. There are no suspicious nodules. Musculoskeletal: There is no acute osseous abnormality or suspicious osseous lesion. There is a probable benign intraosseous hemangioma in the T11 vertebral body. CT ABDOMEN PELVIS FINDINGS Hepatobiliary: The liver is unremarkable. Layering hyperdensity in the gallbladder likely reflects vicarious excretion of contrast. There is no biliary ductal dilatation. Pancreas: Grossly unremarkable. Spleen: Unremarkable. Adrenals/Urinary Tract: The adrenals are unremarkable. There is retained contrast in the kidneys. There are no suspicious renal lesions. There are no stones in either kidney or along the course of either ureter. There is no hydronephrosis or hydroureter. A Foley  catheter is in place in the bladder accounting for the anti dependent gas. Stomach/Bowel: The stomach is unremarkable. There is no evidence of bowel obstruction. There is no abnormal bowel wall thickening or inflammatory change. There is moderate stool throughout the colon. The appendix is normal. There is colonic diverticulosis without evidence of acute diverticulitis. Vascular/Lymphatic: There is extensive calcified plaque throughout the nonaneurysmal abdominal aorta. There is no abdominal or pelvic lymphadenopathy. Reproductive: The prostate is enlarged. Other: There is no ascites or free intraperitoneal air. There is no retroperitoneal hematoma. There is no right groin hematoma. Musculoskeletal: There is a small lipoma in the left gluteal musculature. There is no acute osseous abnormality or suspicious osseous lesion. There is advanced disc degeneration at L4-L5. IMPRESSION: 1. No retroperitoneal hematoma, right groin hematoma, or other acute finding in the abdomen or pelvis. 2. Small bilateral pleural effusions with adjacent airspace opacity which may reflect atelectasis, infection, or combination thereof. 3. Colonic diverticulosis without evidence of acute diverticulitis. 4. Persistent contrast in the kidneys suggesting acute renal injury. 5. Impella device terminating in the left ventricle and Swan-Ganz catheter terminating in the right pulmonary artery. Electronically Signed   By: Lesia Hausen M.D.   On: 12/26/2022 11:07    Medications:   Scheduled Medications:  aspirin EC  81 mg Oral Daily   Chlorhexidine Gluconate Cloth  6 each Topical Daily   digoxin  0.0625 mg Oral Daily   docusate sodium  100 mg Oral BID   insulin aspart  0-15 Units Subcutaneous TID WC   insulin glargine-yfgn  14 Units Subcutaneous BID   melatonin  3 mg Oral QHS   pantoprazole  40 mg Oral Daily   rosuvastatin  40 mg Oral Daily   sertraline  50 mg Oral Daily   sodium chloride flush  3 mL Intravenous Q12H   tamsulosin  0.4  mg Oral QPC supper   ticagrelor  90 mg Oral BID    Infusions:  epinephrine     milrinone 0.125 mcg/kg/min (12/28/22 0600)   norepinephrine (LEVOPHED) Adult infusion Stopped (12/26/22 0358)   sodium bicarbonate 25 mEq (Impella PURGE) in dextrose 5 % 1000 mL bag      PRN Medications: sodium chloride, acetaminophen, lidocaine-prilocaine, ondansetron (ZOFRAN) IV, mouth rinse, polyethylene glycol, sodium chloride flush Assessment/Plan:  1. Anterior STEMI>>Cardiogenic shock -Echo 4/18 EF 55-60%, - Presented with STEMI 11/1. LHC: LM: Normal. LAD: 80% calcific proximal/mid LAD disease, Mid 100% occlusion (culprit lesion) Distal apical 80% stenosis. LCX 100% pCTO, RCA 100% mCTO with L->L and L-> R collats. s/p mid LAD overlap DES - Echo 11/1 EF < 20 RV mild HK - POCUS 11/2 EF 25-30% RV ok  - Impella CP placed for hemodynamic support with CGS, currently on P8 - Failed Impella wean to P-6 on 11/2 but now appears he was also bleeding. CT C/A/P 11/3 no bleed.  - On Impella P-3 + Milrinone 0.125 mcg. Plan to pull impella today - CO-OX 59%. CVP 1 CO 5.6 CI 3.1 - Will give bolus, drinking plenty PO - limited echo 11/4: EF 25%, RV normal  - Hold diuretics/SGLT2i for now.  - Continue Brilinta and statin - Continue digoxin.  2. 3v CAD now s/p anterior STEMI - s/p PCI/overlapping DES mLAD.  Had chronically occluded RCA and LCx with collaterals.  - continue DAPT/statin.  - No chest pain.   3.Acute respiratory failure - required NRB in lab 2/2 desaturations - now on RA - PCCM has signed off   4. Acute blood loss anemia with L groin hematoma - post-procedure. - no evidence of ongoing bleeding on exam. CT C/A/P with no RP bleed.  - Has received 1u RBC -> hgb 8.6, stable.  - Will follow. 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 Thrombocytopenia  - PLTS 122 -> 77 -> 68->63 ->60 - suspect combination shock/bleeding and impella - hold heparin  for now. Sodium Bicarb in purge.  - HIT pending.  - Impella site ok. No bleeding.   Length of Stay: 4  Alma Merlene Morse AGACNP-BC 12/28/2022, 7:48 AM  Advanced Heart Failure Team Pager [phone] (M-F; 7a - 4p)  Please contact [facility] for night-coverage after hours (4p -7a ) and weekends on [email or web address]  Patient seen with NP, agree with the above note.  He is on milrinone 0.125 this morning with Impella CP P3 overnight.  CI 3.16 thermo, co-ox 59%.  CVP low at 1, no diuretic yesterday.  Creatinine 0.98.   HIT negative, Hgb stable 8.6 with plts 60K.   General: NAD Neck: No JVD, no thyromegaly or thyroid nodule.  Lungs: Clear to auscultation bilaterally with normal respiratory effort. CV: Nondisplaced PMI.  Heart regular S1/S2, no S3/S4, no murmur.  No peripheral edema.    Abdomen: Soft, nontender, no hepatosplenomegaly, no distention.  Skin: Intact without lesions or rashes.  Neurologic: Alert and oriented x 3.  Psych: Normal affect. Extremities: No clubbing or cyanosis.  HEENT: Normal.   Impella down to P2, repeat CI 2.68 by thermodilution.  Increased milrinone to 0.25 mcg/kg/min in anticipation of Impella pull.  Will plan removal of Impella with pressure hold today.  He does not need a diuretic.    HIT negative.   No evidence for overt bleeding at this time, hgb stable at 8.6.  CT abd/pelvis without RP  bleeding yesterday.   Continue ASA/Brilinta + statin post-PCI.   CRITICAL CARE Performed by: Marca Ancona  Total critical care time: 40 minutes  Critical care time was exclusive of separately billable procedures and treating other patients.  Critical care was necessary to treat or prevent imminent or life-threatening deterioration.  Critical care was time spent personally by me on the following activities: development of treatment plan with patient and/or surrogate as well as nursing, discussions with consultants, evaluation of patient's response to treatment, examination  of patient, obtaining history from patient or surrogate, ordering and performing treatments and interventions, ordering and review of laboratory studies, ordering and review of radiographic studies, pulse oximetry and re-evaluation of patient's condition.  Marca Ancona 12/28/2022 8:51 AM

## 2022-12-29 ENCOUNTER — Encounter (HOSPITAL_COMMUNITY): Admission: EM | Disposition: E | Payer: Medicare PPO | Source: Home / Self Care | Attending: Cardiology

## 2022-12-29 DIAGNOSIS — I2510 Atherosclerotic heart disease of native coronary artery without angina pectoris: Secondary | ICD-10-CM | POA: Diagnosis not present

## 2022-12-29 DIAGNOSIS — I213 ST elevation (STEMI) myocardial infarction of unspecified site: Secondary | ICD-10-CM | POA: Diagnosis not present

## 2022-12-29 DIAGNOSIS — R570 Cardiogenic shock: Secondary | ICD-10-CM | POA: Diagnosis not present

## 2022-12-29 HISTORY — PX: RIGHT/LEFT HEART CATH AND CORONARY ANGIOGRAPHY: CATH118266

## 2022-12-29 LAB — POCT I-STAT 7, (LYTES, BLD GAS, ICA,H+H)
Acid-base deficit: 8 mmol/L — ABNORMAL HIGH (ref 0.0–2.0)
Bicarbonate: 13.6 mmol/L — ABNORMAL LOW (ref 20.0–28.0)
Calcium, Ion: 1.19 mmol/L (ref 1.15–1.40)
HCT: 29 % — ABNORMAL LOW (ref 39.0–52.0)
Hemoglobin: 9.9 g/dL — ABNORMAL LOW (ref 13.0–17.0)
O2 Saturation: 95 %
Potassium: 4.1 mmol/L (ref 3.5–5.1)
Sodium: 134 mmol/L — ABNORMAL LOW (ref 135–145)
TCO2: 14 mmol/L — ABNORMAL LOW (ref 22–32)
pCO2 arterial: 18.4 mm[Hg] — CL (ref 32–48)
pH, Arterial: 7.477 — ABNORMAL HIGH (ref 7.35–7.45)
pO2, Arterial: 69 mm[Hg] — ABNORMAL LOW (ref 83–108)

## 2022-12-29 LAB — CBC
HCT: 25.9 % — ABNORMAL LOW (ref 39.0–52.0)
Hemoglobin: 8.3 g/dL — ABNORMAL LOW (ref 13.0–17.0)
MCH: 27.6 pg (ref 26.0–34.0)
MCHC: 32 g/dL (ref 30.0–36.0)
MCV: 86 fL (ref 80.0–100.0)
Platelets: 64 10*3/uL — ABNORMAL LOW (ref 150–400)
RBC: 3.01 MIL/uL — ABNORMAL LOW (ref 4.22–5.81)
RDW: 16.2 % — ABNORMAL HIGH (ref 11.5–15.5)
WBC: 6.2 10*3/uL (ref 4.0–10.5)
nRBC: 0 % (ref 0.0–0.2)

## 2022-12-29 LAB — COOXEMETRY PANEL
Carboxyhemoglobin: 1.5 % (ref 0.5–1.5)
Carboxyhemoglobin: 1.4 % (ref 0.5–1.5)
Carboxyhemoglobin: 1.5 % (ref 0.5–1.5)
Methemoglobin: <0.7 % (ref 0.0–1.5)
Methemoglobin: <0.7 % (ref 0.0–1.5)
Methemoglobin: <0.7 % (ref 0.0–1.5)
O2 Saturation: 66.1 %
O2 Saturation: 50.3 %
O2 Saturation: 57.7 %
Total hemoglobin: 9 g/dL — ABNORMAL LOW (ref 12.0–16.0)
Total hemoglobin: 10 g/dL — ABNORMAL LOW (ref 12.0–16.0)
Total hemoglobin: 9.3 g/dL — ABNORMAL LOW (ref 12.0–16.0)

## 2022-12-29 LAB — CG4 I-STAT (LACTIC ACID): Lactic Acid, Venous: 3.6 mmol/L (ref 0.5–1.9)

## 2022-12-29 LAB — BASIC METABOLIC PANEL
Anion gap: 6 (ref 5–15)
BUN: 24 mg/dL — ABNORMAL HIGH (ref 8–23)
CO2: 23 mmol/L (ref 22–32)
Calcium: 8.2 mg/dL — ABNORMAL LOW (ref 8.9–10.3)
Chloride: 106 mmol/L (ref 98–111)
Creatinine, Ser: 0.98 mg/dL (ref 0.61–1.24)
GFR, Estimated: >60 mL/min (ref >60)
Glucose, Bld: 133 mg/dL — ABNORMAL HIGH (ref 70–99)
Potassium: 3.8 mmol/L (ref 3.5–5.1)
Sodium: 135 mmol/L (ref 135–145)

## 2022-12-29 LAB — MAGNESIUM: Magnesium: 2 mg/dL (ref 1.7–2.4)

## 2022-12-29 LAB — GLUCOSE, CAPILLARY
Glucose-Capillary: 84 mg/dL (ref 70–99)
Glucose-Capillary: 310 mg/dL — ABNORMAL HIGH (ref 70–99)
Glucose-Capillary: 339 mg/dL — ABNORMAL HIGH (ref 70–99)
Glucose-Capillary: 176 mg/dL — ABNORMAL HIGH (ref 70–99)
Glucose-Capillary: 286 mg/dL — ABNORMAL HIGH (ref 70–99)

## 2022-12-29 LAB — FIBRINOGEN: Fibrinogen: 416 mg/dL (ref 210–475)

## 2022-12-29 LAB — LACTATE DEHYDROGENASE: LDH: 482 U/L — ABNORMAL HIGH (ref 98–192)

## 2022-12-29 SURGERY — RIGHT/LEFT HEART CATH AND CORONARY ANGIOGRAPHY
Anesthesia: LOCAL

## 2022-12-29 MED ORDER — ACETAMINOPHEN 650 MG RE SUPP: 650.0000 mg | Freq: Four times a day (QID) | RECTAL | Status: DC | PRN

## 2022-12-29 MED ORDER — POLYVINYL ALCOHOL 1.4 % OP SOLN: 1.0000 [drp] | Freq: Four times a day (QID) | OPHTHALMIC | Status: DC | PRN

## 2022-12-29 MED ORDER — MIDAZOLAM-SODIUM CHLORIDE 100-0.9 MG/100ML-% IV SOLN
INTRAVENOUS | Status: AC
  Filled 2022-12-29: qty 100

## 2022-12-29 MED ORDER — GLYCOPYRROLATE 0.2 MG/ML IJ SOLN: 0.2000 mg | INTRAMUSCULAR | Status: DC | PRN

## 2022-12-29 MED ORDER — MORPHINE 100MG IN NS 100ML (1MG/ML) PREMIX INFUSION: 1.0000 mg/h | INTRAVENOUS | Status: DC

## 2022-12-29 MED ORDER — MIDAZOLAM-SODIUM CHLORIDE 100-0.9 MG/100ML-% IV SOLN
0.5000 mg/h | INTRAVENOUS | Status: DC
  Administered 2022-12-29: 0.5 mg/h via INTRAVENOUS

## 2022-12-29 MED ORDER — IOHEXOL 350 MG/ML SOLN
INTRAVENOUS | Status: DC | PRN
  Administered 2022-12-29: 20 mL

## 2022-12-29 MED ORDER — LIDOCAINE HCL (PF) 1 % IJ SOLN
INTRAMUSCULAR | Status: AC
  Filled 2022-12-29: qty 30

## 2022-12-29 MED ORDER — ONDANSETRON HCL 4 MG/2ML IJ SOLN
INTRAMUSCULAR | Status: AC
  Filled 2022-12-29: qty 2

## 2022-12-29 MED ORDER — DOBUTAMINE-DEXTROSE 4-5 MG/ML-% IV SOLN
5.0000 ug/kg/min | INTRAVENOUS | Status: DC
  Filled 2022-12-29: qty 250

## 2022-12-29 MED ORDER — HEPARIN (PORCINE) IN NACL 1000-0.9 UT/500ML-% IV SOLN
INTRAVENOUS | Status: DC | PRN
  Administered 2022-12-29 (×3): 500 mL

## 2022-12-29 MED ORDER — ACETAMINOPHEN 325 MG PO TABS: 650.0000 mg | ORAL_TABLET | Freq: Four times a day (QID) | ORAL | Status: DC | PRN

## 2022-12-29 MED ORDER — DOBUTAMINE-DEXTROSE 4-5 MG/ML-% IV SOLN
INTRAVENOUS | Status: AC
  Filled 2022-12-29: qty 250

## 2022-12-29 MED ORDER — DOBUTAMINE INFUSION FOR EP/ECHO/NUC (1000 MCG/ML)
INTRAVENOUS | Status: AC | PRN
  Administered 2022-12-29: 5 ug/kg/min via INTRAVENOUS

## 2022-12-29 MED ORDER — ONDANSETRON HCL 4 MG/2ML IJ SOLN
INTRAMUSCULAR | Status: DC | PRN
  Administered 2022-12-29: 4 mg via INTRAVENOUS

## 2022-12-29 MED ORDER — LIDOCAINE HCL (PF) 1 % IJ SOLN
INTRAMUSCULAR | Status: DC | PRN
  Administered 2022-12-29: 10 mL via INTRADERMAL
  Administered 2022-12-29: 5 mL via INTRADERMAL

## 2022-12-29 MED ORDER — NOREPINEPHRINE 4 MG/250ML-% IV SOLN
INTRAVENOUS | Status: AC
  Filled 2022-12-29: qty 250

## 2022-12-29 MED ORDER — NOREPINEPHRINE 4 MG/250ML-% IV SOLN
0.0000 ug/min | INTRAVENOUS | Status: DC
  Administered 2022-12-29: 2 ug/min via INTRAVENOUS

## 2022-12-29 MED ORDER — GLYCOPYRROLATE 1 MG PO TABS: 1.0000 mg | ORAL_TABLET | ORAL | Status: DC | PRN

## 2022-12-29 MED ORDER — MORPHINE 100MG IN NS 100ML (1MG/ML) PREMIX INFUSION
INTRAVENOUS | Status: AC
  Administered 2022-12-29: 1 mg/h via INTRAVENOUS
  Filled 2022-12-29: qty 100

## 2022-12-29 SURGICAL SUPPLY — 15 items
CATH INFINITI 5FR MULTPACK ANG (CATHETERS) IMPLANT
CATH SWAN GANZ 7F STRAIGHT (CATHETERS) IMPLANT
ELECT DEFIB PAD ADLT CADENCE (PAD) IMPLANT
GUIDEWIRE .025 260CM (WIRE) IMPLANT
KIT ENCORE 26 ADVANTAGE (KITS) IMPLANT
KIT MICROPUNCTURE NIT STIFF (SHEATH) IMPLANT
KIT SYRINGE INJ CVI SPIKEX1 (MISCELLANEOUS) IMPLANT
PACK CARDIAC CATHETERIZATION (CUSTOM PROCEDURE TRAY) ×2 IMPLANT
SET ATX-X65L (MISCELLANEOUS) IMPLANT
SHEATH PINNACLE 6F 10CM (SHEATH) IMPLANT
SHEATH PINNACLE 7F 10CM (SHEATH) IMPLANT
SHEATH PROBE COVER 6X72 (BAG) IMPLANT
SHIELD CATH-GARD CONTAMINATION (MISCELLANEOUS) IMPLANT
TUBING CIL FLEX 10 FLL-RA (TUBING) IMPLANT
WIRE EMERALD 3MM-J .035X150CM (WIRE) IMPLANT

## 2022-12-29 NOTE — Progress Notes (Signed)
82 y.o. male with hypertension, type II DM, h/o left carotid endarterectomy, admitted with STEMI and cardiogenic shock on 01/19/2023, underwent mid LAD PCI, also has CTO proximal left circumflex and mid RCA.  Patient is Impella was weaned off today, and has been doing well.  Today around 6:30 PM, he had sudden onset chest pain, similar to his initial presentation, tingling and numbness in both his feet.  He is hypotensive in the 70s/50s.  EKG shows left bundle branch block, which was also present on most recent EKG 12/25/2022.  It is difficult to rule out Sgarbossa criteria.  Given clinical concern for STEMI and stent thrombosis, along with hypotension concerning for cardiogenic shock, I recommend urgent coronary angiography and possible intervention.  In the meantime, I started him on norepinephrine with minimal improvement in blood pressure to 82/52 mmHg.   Code STEMI activated.  CRITICAL CARE Performed by: Truett Mainland   Total critical care time: 30 minutes   Critical care time was exclusive of separately billable procedures and treating other patients.   Critical care was necessary to treat or prevent imminent or life-threatening deterioration.   Critical care was time spent personally by me on the following activities: development of treatment plan with patient and/or surrogate as well as nursing, discussions with consultants, evaluation of patient's response to treatment, examination of patient, obtaining history from patient or surrogate, ordering and performing treatments and interventions, ordering and review of laboratory studies, ordering and review of radiographic studies, pulse oximetry and re-evaluation of patient's condition.

## 2022-12-29 NOTE — Progress Notes (Signed)
RN paged chaplain to communicate that family had requested chaplain presence. Upon entering the room chaplain found pt in bed still moving, wife and two daughters at bedside. Chaplain introduced self and role. Chaplain confirmed with the two daughters that pt is CC.   As pt transitioned, chaplain maintained a compassionate presence for the family. As more family arrived, chaplain provided more chairs for the family. After pt died, chaplain prayed per request.  Family observed comforting one another and heard singing of hymns and prayers.   01/01/2023 2229  Spiritual Encounters  Type of Visit Initial  Care provided to: Family  Referral source Nurse (RN/NT/LPN)  Reason for visit Patient death  OnCall Visit Yes  Spiritual Framework  Presenting Themes Rituals and practive  Values/beliefs Family with faith background rooted in [place] practices  Strengths faith; family support  Family Stress Factors Loss  Interventions  Spiritual Care Interventions Made Prayer

## 2022-12-29 NOTE — Progress Notes (Signed)
90 mL of versed wasted in stericycle. Witnessed by [employer], Charity fundraiser. 90 mL of morphine wasted in stericycle. Witnessed by [employer], Charity fundraiser.

## 2022-12-29 NOTE — Progress Notes (Signed)
Patient DNR Comfort care. Time of death Jan 06, 2152. Pronounced by Danella Deis and Jeronimo, Rns after auscultating no heart or lung tones for one minute. Family at besside. Heart strip printed showing asystole.

## 2022-12-29 NOTE — Plan of Care (Signed)
  Problem: Education: Goal: Knowledge of General Education information will improve Description Including pain rating scale, medication(s)/side effects and non-pharmacologic comfort measures Outcome: Progressing   

## 2022-12-29 NOTE — Interval H&P Note (Signed)
History and Physical Interval Note:  01/08/2023 7:14 PM  Jeremy Patton  has presented today for surgery, with the diagnosis of STEMI.  The various methods of treatment have been discussed with the patient and family. After consideration of risks, benefits and other options for treatment, the patient has consented to  Procedure(s): LEFT HEART CATH AND CORONARY ANGIOGRAPHY (N/A) as a surgical intervention.  The patient's history has been reviewed, patient examined, no change in status, stable for surgery.  I have reviewed the patient's chart and labs.  Questions were answered to the patient's satisfaction.     Jeremy Patton J Asmi Fugere

## 2022-12-29 NOTE — H&P (View-Only) (Signed)
82 y.o. male with hypertension, type II DM, h/o left carotid endarterectomy, admitted with STEMI and cardiogenic shock on 01/19/2023, underwent mid LAD PCI, also has CTO proximal left circumflex and mid RCA.  Patient is Impella was weaned off today, and has been doing well.  Today around 6:30 PM, he had sudden onset chest pain, similar to his initial presentation, tingling and numbness in both his feet.  He is hypotensive in the 70s/50s.  EKG shows left bundle branch block, which was also present on most recent EKG 12/25/2022.  It is difficult to rule out Sgarbossa criteria.  Given clinical concern for STEMI and stent thrombosis, along with hypotension concerning for cardiogenic shock, I recommend urgent coronary angiography and possible intervention.  In the meantime, I started him on norepinephrine with minimal improvement in blood pressure to 82/52 mmHg.   Code STEMI activated.  CRITICAL CARE Performed by: Truett Mainland   Total critical care time: 30 minutes   Critical care time was exclusive of separately billable procedures and treating other patients.   Critical care was necessary to treat or prevent imminent or life-threatening deterioration.   Critical care was time spent personally by me on the following activities: development of treatment plan with patient and/or surrogate as well as nursing, discussions with consultants, evaluation of patient's response to treatment, examination of patient, obtaining history from patient or surrogate, ordering and performing treatments and interventions, ordering and review of laboratory studies, ordering and review of radiographic studies, pulse oximetry and re-evaluation of patient's condition.

## 2022-12-29 NOTE — Inpatient Diabetes Management (Signed)
Inpatient Diabetes Program Recommendations  AACE/ADA: New Consensus Statement on Inpatient Glycemic Control (2015)  Target Ranges:  Prepandial:   less than 140 mg/dL      Peak postprandial:   less than 180 mg/dL (1-2 hours)      Critically ill patients:  140 - 180 mg/dL   Lab Results  Component Value Date   GLUCAP 310 (H) 01/02/2023   HGBA1C 8.2 (H) 01/04/2023    Review of Glycemic Control  Latest Reference Range & Units 12/28/22 16:06 12/28/22 21:43 01/10/2023 05:54 01/09/2023 12:18 12/26/2022 12:19  Glucose-Capillary 70 - 99 mg/dL 161 (H) 096 (H) 84 045 (H) 310 (H)   Diabetes history: DM 2 Outpatient Diabetes medications:  Metformin 1000 mg bid Lantus 10 units daily Current orders for Inpatient glycemic control:  Novolog 0-15 units tid with meals  Semglee 14 units bid  Inpatient Diabetes Program Recommendations:    Note elevated post-prandial blood sugars.  If blood sugars continue to be >goal at lunch and supper- consider Novolog 2-3 units tid with meals with meals (hold if patient eats less than 50% or NPO).   Thanks,  Beryl Meager, RN, BC-ADM Inpatient Diabetes Coordinator Pager [phone] (8a-5p)

## 2022-12-29 NOTE — Progress Notes (Addendum)
Advanced Heart Failure Rounding Note   Subjective:    11/1 anterior/inferior STEMI d/t acute LAD Occlusion (RCA and LCX 100% CTO). LAD PCI. -> shock -> impella 11/1 Echo EF < 20 RV mildly down  11/3 Given 1UPRBC 11/4 limited echo: EF 25%, RV normal 11/5 Impella removed   Remains on milrinone 0.25 mcg . CO-OX 66%  Swan #s  CVP 1 PA 17/9 (10) CO 5.9  CI 3.3  Feels good. Initially dizzy when standing. .    Objective:   Weight Range:  Vital Signs:   Temp:  [98.6 F (37 C)-99.5 F (37.5 C)] 99 F (37.2 C) (11/06 0700) Pulse Rate:  [76-111] 87 (11/06 0700) Resp:  [0-28] 19 (11/06 0700) BP: (82-141)/(42-73) 107/48 (11/06 0700) SpO2:  [81 %-100 %] 98 % (11/06 0700) Weight:  [62 kg] 62 kg (11/06 0500) Last BM Date : 12/25/22  Weight change: Filed Weights   12/27/22 0500 12/28/22 0500 01/01/2023 0500  Weight: 61.4 kg 61.6 kg 62 kg    Intake/Output:   Intake/Output Summary (Last 24 hours) at 01/21/2023 0742 Last data filed at 01/11/2023 0700 Gross per 24 hour  Intake 387.8 ml  Output 2240 ml  Net -1852.2 ml    CVP 1 Physical Exam: General:   No resp difficulty HEENT: normal Neck: supple. no JVD. Carotids 2+ bilat; no bruits. No lymphadenopathy or thryomegaly appreciated. RIJ swan  Cor: PMI nondisplaced. Regular rate & rhythm. No rubs, gallops or murmurs. Lungs: clear Abdomen: soft, nontender, nondistended. No hepatosplenomegaly. No bruits or masses. Good bowel sounds. Extremities: no cyanosis, clubbing, rash, edema R groin small soft. ecchymotic area Neuro: alert & orientedx3, cranial nerves grossly intact. moves all 4 extremities w/o difficulty. Affect pleasant   Telemetry: SR with occasional PVCs.  Labs: Basic Metabolic Panel: Recent Labs  Lab 12/25/22 0323 12/26/22 0228 12/27/22 0354 12/28/22 0352 01/18/2023 0333  NA 134* 136 136 136 135  K 3.7 3.8 3.9 4.0 3.8  CL 102 107 109 110 106  CO2 20* 23 22 23 23   GLUCOSE 291* 274* 205* 162* 133*  BUN 29*  24* 20 16 24*  CREATININE 1.34* 1.28* 1.12 0.98 0.98  CALCIUM 8.2* 8.0* 8.2* 8.1* 8.2*  MG 1.7 2.1 2.0 2.1 2.0    Liver Function Tests: Recent Labs  Lab 01/20/2023 1000  AST 25  ALT 21  ALKPHOS 46  BILITOT 0.7  PROT 6.1*  ALBUMIN 3.2*   No results for input(s): "LIPASE", "AMYLASE" in the last 168 hours. No results for input(s): "AMMONIA" in the last 168 hours.  CBC: Recent Labs  Lab 01/07/2023 1000 01/06/2023 1003 12/26/22 0228 12/26/22 1003 12/27/22 0354 12/28/22 0352 01/16/2023 0333  WBC 7.2   < > 8.7 8.3 7.5 6.7 6.2  NEUTROABS 5.4  --   --   --   --   --   --   HGB 10.7*   < > 7.0* 8.3* 8.6* 8.6* 8.3*  HCT 32.5*   < > 20.7* 24.8* 26.3* 26.4* 25.9*  MCV 84.9   < > 85.2 83.2 86.5 85.4 86.0  PLT 154   < > 77* 68* 63* 60* 64*   < > = values in this interval not displayed.    Cardiac Enzymes: No results for input(s): "CKTOTAL", "CKMB", "CKMBINDEX", "TROPONINI" in the last 168 hours.  BNP: BNP (last 3 results) No results for input(s): "BNP" in the last 8760 hours.  ProBNP (last 3 results) No results for input(s): "PROBNP" in the last 8760  hours.    Other results:  Imaging: ECHOCARDIOGRAM LIMITED  Result Date: 12/27/2022    ECHOCARDIOGRAM LIMITED REPORT   Patient Name:   DAIDEN COLTRANE Date of Exam: 12/27/2022 Medical Rec #:  [id number]           Height:       69.0 in Accession #:    [id number]          Weight:       135.4 lb Date of Birth:  1940-04-01           BSA:          1.750 m Patient Age:    82 years            BP:           115/75 mmHg Patient Gender: M                   HR:           86 bpm. Exam Location:  Inpatient Procedure: Limited Echo and Color Doppler Indications:    I50.21 Acute systolic (congestive) heart failure  History:        Patient has prior history of Echocardiogram examinations, most                 recent 12/28/2022. CHF; Risk Factors:Hypertension, Diabetes and                 Dyslipidemia.  Sonographer:    Irving Burton Senior RDCS Referring Phys:  [phone] [facility]  Sonographer Comments: Impella check, Dr. Shirlee Latch at bedside IMPRESSIONS  1. Impella well postioned. Inflow port about 4.6 cm from AoV. Left ventricular ejection fraction, by estimation, is 25%.  2. Right ventricular systolic function is normal.  3. The mitral valve is normal in structure. Mild mitral valve regurgitation.  4. The aortic valve was not well visualized.  5. The inferior vena cava is normal in size with greater than 50% respiratory variability, suggesting right atrial pressure of 3 mmHg. Conclusion(s)/Recommendation(s): Limited echo for LV function and Impella placement. FINDINGS  Left Ventricle: Impella well postioned. Inflow port about 4.6 cm from AoV. Left ventricular ejection fraction, by estimation, is 25%. Right Ventricle: Right ventricular systolic function is normal. Pericardium: There is no evidence of pericardial effusion. Mitral Valve: The mitral valve is normal in structure. There is mild thickening of the mitral valve leaflet(s). Mild mitral valve regurgitation. Tricuspid Valve: The tricuspid valve is grossly normal. Aortic Valve: The aortic valve was not well visualized. Venous: The inferior vena cava is normal in size with greater than 50% respiratory variability, suggesting right atrial pressure of 3 mmHg. Additional Comments: Color Doppler performed.  Arvilla Meres MD Electronically signed by Arvilla Meres MD Signature Date/Time: 12/27/2022/11:20:33 AM    Final     Medications:   Scheduled Medications:  aspirin EC  81 mg Oral Daily   Chlorhexidine Gluconate Cloth  6 each Topical Daily   digoxin  0.0625 mg Oral Daily   feeding supplement (GLUCERNA SHAKE)  237 mL Oral TID BM   insulin aspart  0-15 Units Subcutaneous TID WC   insulin glargine-yfgn  14 Units Subcutaneous BID   melatonin  3 mg Oral QHS   multivitamin with minerals  1 tablet Oral Daily   pantoprazole  40 mg Oral Daily   polyethylene glycol  17 g Oral Daily   rosuvastatin  40 mg Oral  Daily   senna  1 tablet Oral BID   sertraline  50 mg Oral Daily   sodium chloride flush  3 mL Intravenous Q12H   tamsulosin  0.4 mg Oral QPC supper   ticagrelor  90 mg Oral BID    Infusions:  epinephrine     milrinone 0.25 mcg/kg/min (01/05/2023 0700)   norepinephrine (LEVOPHED) Adult infusion Stopped (12/26/22 0358)    PRN Medications: sodium chloride, acetaminophen, lidocaine-prilocaine, ondansetron (ZOFRAN) IV, mouth rinse, polyethylene glycol, sodium chloride flush Assessment/Plan:  1. Anterior STEMI>>Cardiogenic shock -Echo 4/18 EF 55-60%, - Presented with STEMI 11/1. LHC: LM: Normal. LAD: 80% calcific proximal/mid LAD disease, Mid 100% occlusion (culprit lesion) Distal apical 80% stenosis. LCX 100% pCTO, RCA 100% mCTO with L->L and L-> R collats. s/p mid LAD overlap DES - Echo 11/1 EF < 20 RV mild HK - POCUS 11/2 EF 25-30% RV ok  - Impella CP placed for hemodynamic support with CGS, currently on P8 - Failed Impella wean to P-6 on 11/2 but now appears he was also bleeding. CT C/A/P 11/3 no bleed.  - 11/4 limited echo: EF 25%, RV normal  - 11/5 Impella removed.  CO-OX stable. CVP 1. Cut back milrinone 0.125 . Check CO-OX at noon , if stable stop milrinone.  - Continue to hold diuretics/SGLT2i.   - Continue Brilinta and statin - Continue digoxin.  2. 3v CAD now s/p anterior STEMI - s/p PCI/overlapping DES mLAD.  Had chronically occluded RCA and LCx with collaterals.  - continue DAPT/statin.  - No chest pain.   3.Acute respiratory failure - required NRB in lab 2/2 desaturations - now on RA - PCCM has signed off   4. Acute blood loss anemia with L groin hematoma - post-procedure. - no evidence of ongoing bleeding on exam. CT C/A/P with no RP bleed.  - Has received 1u RBC -> hgb 8.3 stable.  - Stable    DMII -Per primary -A1c 8.2 -Continue SSI  6. Thrombocytopenia  - PLTS 122 -> 77 -> 68->63 ->60-->64  - suspect combination shock/bleeding and impella -  HIT  negative.   Remove swan. Wean milrionone. Consult PT.   Transfer to 3 east.  Length of Stay: 5  Amy Clegg NP-C  01/18/2023, 7:42 AM  Advanced Heart Failure Team Pager [phone] (M-F; 7a - 4p)  Please contact [facility] for night-coverage after hours (4p -7a ) and weekends on [email or web address]  Patient seen with NP, agree with the above note.   Impella pulled yesterday, now on milrinone 0.25 with CVP 1, CI 3.3 (thermo), co-ox 66%.  No dyspnea.  Mild lightheadedness when he first stood up this morning but it passed.   General: NAD Neck: No JVD, no thyromegaly or thyroid nodule.  Lungs: Clear to auscultation bilaterally with normal respiratory effort. CV: Nondisplaced PMI.  Heart regular S1/S2, no S3/S4, no murmur.  No peripheral edema.   Abdomen: Soft, nontender, no hepatosplenomegaly, no distention.  Skin: Intact without lesions or rashes.  Neurologic: Alert and oriented x 3.  Psych: Normal affect. Extremities: No clubbing or cyanosis.  HEENT: Normal.   Decrease milrinone to 0.125, repeat co-ox later today and can stop milrinone if adequate.  Remove Swan.  Continue digoxin.  Hold off on other GDMT for now with soft BP at times and low CVP.    Hgb stable at 8.3, plts mildly higher at 64.   Mobilized.   CRITICAL CARE Performed by: Marca Ancona  Total critical care time: 35 minutes  Critical care time was exclusive of separately billable procedures and treating other patients.  Critical care was necessary to treat or prevent imminent or life-threatening deterioration.  Critical care was time spent personally by me on the following activities: development of treatment plan with patient and/or surrogate as well as nursing, discussions with consultants, evaluation of patient's response to treatment, examination of patient, obtaining history from patient or surrogate, ordering and performing treatments and interventions, ordering and review of laboratory studies, ordering and review  of radiographic studies, pulse oximetry and re-evaluation of patient's condition.  Marca Ancona 01/14/2023 8:23 AM

## 2022-12-29 NOTE — Progress Notes (Signed)
Patient ID: Jeremy Patton, male   DOB: 18-Jun-1940, 82 y.o.   MRN: [id number]  Patient did well all day on milrinone 0.125.  He walked around unit without problems. Patient with sudden onset chest pain around 6:30 pm.  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  CP associated with hypotension.  Decision made to take to cath lab.  Coronary angiography showed patent LAD stent and other coronaries unchanged.  RHC with markedly low cardiac output and elevated LVEDP.  No evidence by shunt run for VSD.    I discussed situation with Drs Rosemary Holms and Herbie Baltimore.  I would not replace Impella.  He failed initial Impella wean, then we were able to remove yesterday.  At 50, he would not be a LVAD candidate. I worry that he may have had a mechanical complication of MI such as free wall rupture with sudden onset of chest pain and hypotension, no evidence for VSD on shunt run. He would not be candidate for surgical repair.   I spoke with his family. He is now DNR. We will leave him on current norepinephrine and dobutamine with no escalation.  More family is on the way.  We will start morphine gtt, titrate to comfort (he is still having chest pain).  SBP in 60s-70s currently on dobutamine 5 and NE 10.  I anticipate he will pass away tonight.   CRITICAL CARE Performed by: Marca Ancona  Total critical care time: 30 minutes  Critical care time was exclusive of separately billable procedures and treating other patients.  Critical care was necessary to treat or prevent imminent or life-threatening deterioration.  Critical care was time spent personally by me on the following activities: development of treatment plan with patient and/or surrogate as well as nursing, discussions with consultants, evaluation of patient's response to treatment, examination of patient, obtaining history from patient or surrogate, ordering and performing treatments and interventions, ordering and review of laboratory studies, ordering and review of  radiographic studies, pulse oximetry and re-evaluation of patient's condition.  Marca Ancona 12/31/2022 8:46 PM

## 2022-12-30 ENCOUNTER — Encounter (HOSPITAL_COMMUNITY): Payer: Self-pay | Admitting: Cardiology

## 2022-12-30 LAB — POCT I-STAT EG7
Acid-base deficit: 8 mmol/L — ABNORMAL HIGH (ref 0.0–2.0)
Acid-base deficit: 7 mmol/L — ABNORMAL HIGH (ref 0.0–2.0)
Acid-base deficit: 10 mmol/L — ABNORMAL HIGH (ref 0.0–2.0)
Acid-base deficit: 8 mmol/L — ABNORMAL HIGH (ref 0.0–2.0)
Bicarbonate: 16.4 mmol/L — ABNORMAL LOW (ref 20.0–28.0)
Bicarbonate: 16.9 mmol/L — ABNORMAL LOW (ref 20.0–28.0)
Bicarbonate: 15.1 mmol/L — ABNORMAL LOW (ref 20.0–28.0)
Bicarbonate: 16.4 mmol/L — ABNORMAL LOW (ref 20.0–28.0)
Calcium, Ion: 1.19 mmol/L (ref 1.15–1.40)
Calcium, Ion: 1.21 mmol/L (ref 1.15–1.40)
Calcium, Ion: 1.16 mmol/L (ref 1.15–1.40)
Calcium, Ion: 1.2 mmol/L (ref 1.15–1.40)
HCT: 28 % — ABNORMAL LOW (ref 39.0–52.0)
HCT: 28 % — ABNORMAL LOW (ref 39.0–52.0)
HCT: 28 % — ABNORMAL LOW (ref 39.0–52.0)
HCT: 29 % — ABNORMAL LOW (ref 39.0–52.0)
Hemoglobin: 9.5 g/dL — ABNORMAL LOW (ref 13.0–17.0)
Hemoglobin: 9.5 g/dL — ABNORMAL LOW (ref 13.0–17.0)
Hemoglobin: 9.5 g/dL — ABNORMAL LOW (ref 13.0–17.0)
Hemoglobin: 9.9 g/dL — ABNORMAL LOW (ref 13.0–17.0)
O2 Saturation: 28 %
O2 Saturation: 26 %
O2 Saturation: 27 %
O2 Saturation: 28 %
Potassium: 4.1 mmol/L (ref 3.5–5.1)
Potassium: 4.1 mmol/L (ref 3.5–5.1)
Potassium: 3.8 mmol/L (ref 3.5–5.1)
Potassium: 4.1 mmol/L (ref 3.5–5.1)
Sodium: 135 mmol/L (ref 135–145)
Sodium: 135 mmol/L (ref 135–145)
Sodium: 128 mmol/L — ABNORMAL LOW (ref 135–145)
Sodium: 132 mmol/L — ABNORMAL LOW (ref 135–145)
TCO2: 17 mmol/L — ABNORMAL LOW (ref 22–32)
TCO2: 18 mmol/L — ABNORMAL LOW (ref 22–32)
TCO2: 16 mmol/L — ABNORMAL LOW (ref 22–32)
TCO2: 17 mmol/L — ABNORMAL LOW (ref 22–32)
pCO2, Ven: 27.2 mm[Hg] — ABNORMAL LOW (ref 44–60)
pCO2, Ven: 28 mm[Hg] — ABNORMAL LOW (ref 44–60)
pCO2, Ven: 29.1 mm[Hg] — ABNORMAL LOW (ref 44–60)
pCO2, Ven: 29.1 mm[Hg] — ABNORMAL LOW (ref 44–60)
pH, Ven: 7.387 (ref 7.25–7.43)
pH, Ven: 7.388 (ref 7.25–7.43)
pH, Ven: 7.324 (ref 7.25–7.43)
pH, Ven: 7.359 (ref 7.25–7.43)
pO2, Ven: 18 mm[Hg] — CL (ref 32–45)
pO2, Ven: 17 mm[Hg] — CL (ref 32–45)
pO2, Ven: 19 mm[Hg] — CL (ref 32–45)
pO2, Ven: 19 mm[Hg] — CL (ref 32–45)

## 2022-12-30 LAB — POCT I-STAT 7, (LYTES, BLD GAS, ICA,H+H)
Acid-base deficit: 8 mmol/L — ABNORMAL HIGH (ref 0.0–2.0)
Bicarbonate: 12.8 mmol/L — ABNORMAL LOW (ref 20.0–28.0)
Calcium, Ion: 1.17 mmol/L (ref 1.15–1.40)
HCT: 28 % — ABNORMAL LOW (ref 39.0–52.0)
Hemoglobin: 9.5 g/dL — ABNORMAL LOW (ref 13.0–17.0)
O2 Saturation: 96 %
Potassium: 4 mmol/L (ref 3.5–5.1)
Sodium: 135 mmol/L (ref 135–145)
TCO2: 13 mmol/L — ABNORMAL LOW (ref 22–32)
pCO2 arterial: 15.6 mm[Hg] — CL (ref 32–48)
pH, Arterial: 7.521 — ABNORMAL HIGH (ref 7.35–7.45)
pO2, Arterial: 71 mm[Hg] — ABNORMAL LOW (ref 83–108)

## 2022-12-30 LAB — GLUCOSE, CAPILLARY: Glucose-Capillary: 154 mg/dL — ABNORMAL HIGH (ref 70–99)

## 2023-01-03 ENCOUNTER — Ambulatory Visit: Payer: Medicare PPO | Admitting: Nurse Practitioner

## 2023-01-04 ENCOUNTER — Encounter: Payer: Medicare PPO | Admitting: *Deleted

## 2023-01-23 NOTE — Death Summary Note (Signed)
  Advanced Heart Failure Death Summary  Death Summary   Patient ID: Jeremy Patton MRN: [id number], DOB/AGE: June 20, 1940 82 y.o. Admit date: 01/14/2023 D/C date:     01/14/2023    Primary Discharge Diagnoses:  1. Anterior STEMI complicated by Cardiogenic Shock  2. CAD 3V  CTO RCA/LCx -->S/P PCI/overlapping DES mLAD 3. Acute Hypoxic Respiratory Failure 4. Anemia, acute blood loss with L groin hematoma  5. DMII 6. Thrombocytopenia  7. CKD Stage II   Hospital Course:   Mr Walls was a 82 year old  with a history of HTN, DMII, and carotid disease.   Admitted with STEMI.  Taken urgently to the cath lab and found to have acute LAD lesion requiring PCI/DES to LAD. Procedure complicated by cardiogenic shock and acute hypoxic respiratory failure. CCM and Advanced Heart Failure Team consulted. Placed on Bipap and started on pressors but had ongoing hemodynamic instability so Impella placed. Echo showed EF < 20% with mildly reduced RV. Diuresed with IV lasix.    Attempted Impella wean the next day but developed hypotension. Impella speed turned back up. Platelets /Hemoglobin dropped but suspect this was from Impella and bleeding/hematoma around insertion site. Given 1 unit of blood. CT abd/pelvis without retroperitoneal bleed. Heparin was stopped and Bicarb used in Impella purge. HIT sent and was negative. Once stabilized Impella weaned over a few days and removed on 11/5 with Milrinone 0.25 mcg continuing. The next day he was doing well after impella removal and milrinone was able to be weaned to 0.125 mcg. He was able to walk around the nursing unit without problems.   At 1830 he developed acute chest pain and hypotension. He was taken urgently to the cath lab.  Coronary angiography showed patent LAD stent and other coronaries unchanged. RHC with markedly low cardiac output and elevated LVEDP. No evidence by shunt run for VSD.  Cardiology/Advanced Heart Failure/Interventional team  discussed/reviewed. Concerned he had mechanical complication form MI, free wall rupture and want not a candidate for surgical repair or LVAD. Team discussed severity of his condition with his family. He was made DNR with no escalation. He passed with family at the bedside 01/14/2023 at 2153.    See chart for additional details.    Duration of Discharge Encounter: Greater than 35 minutes   Signed, Tonye Becket, NP 01/09/2023, 11:27 AM

## 2023-01-23 DEATH — deceased

## 2023-04-22 ENCOUNTER — Ambulatory Visit: Payer: Medicare PPO | Admitting: Family Medicine

## 2023-07-05 ENCOUNTER — Ambulatory Visit: Payer: Medicare PPO | Admitting: Neurology

## 2023-08-27 IMAGING — CT CT HEAD W/O CM
4 series · 15 of 47 positions shown, 17 images · non-contrast
Comparison: Head MRI 04/30/2020

CLINICAL DATA: Dizziness, persistent/recurrent, cardiac or vascular
cause suspected.



[Series 2: head w o · axial · 0.44mm/px · z∈[+1219,+1329]mm · 7 of 30 slices shown, 9 images]
[im 4/30  brain]
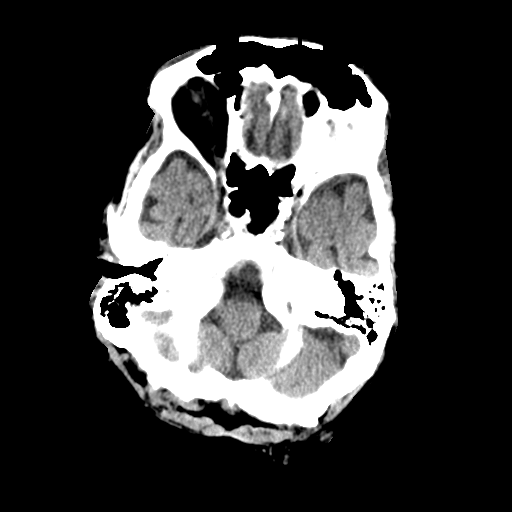
[im 4/30  bone]
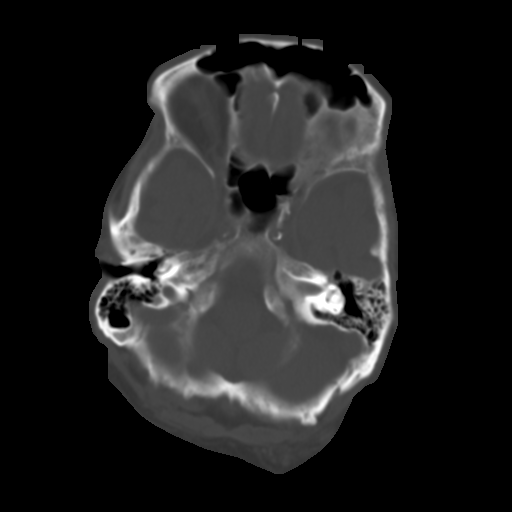
[im 8/30  brain]
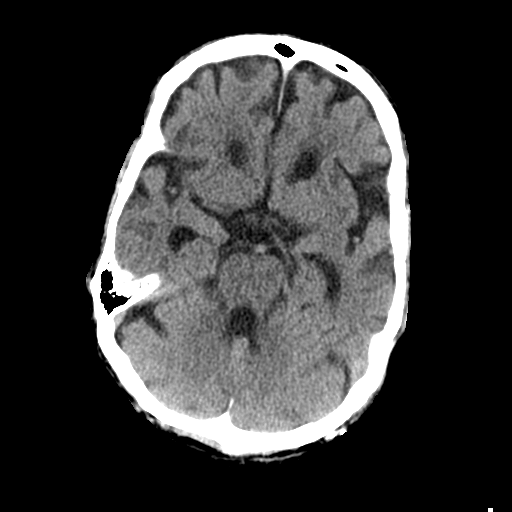
[im 11/30  brain]
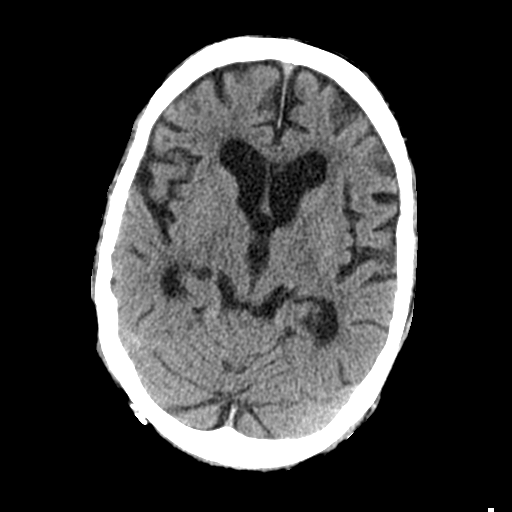
[im 15/30  brain]
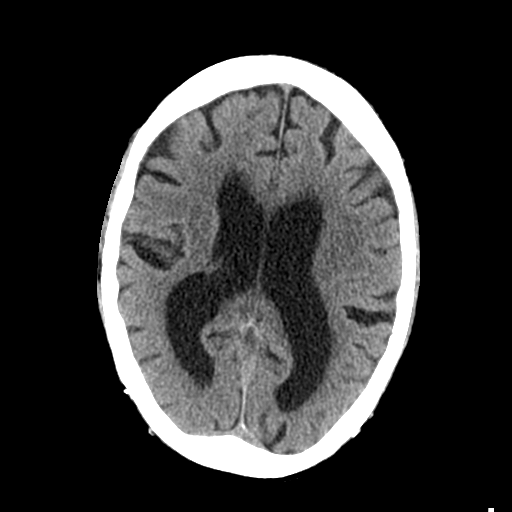
[im 19/30  brain]
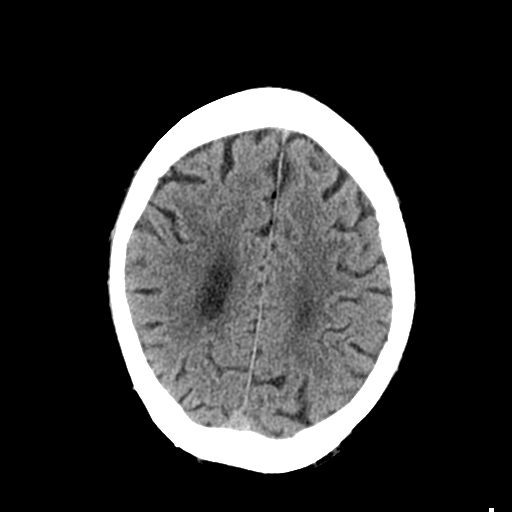
[im 19/30  bone]
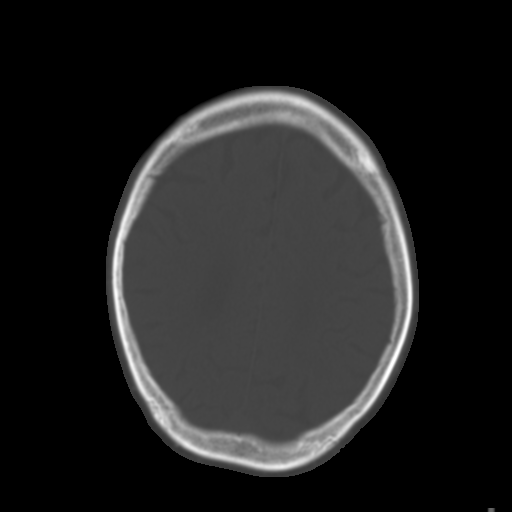
[im 22/30  brain]
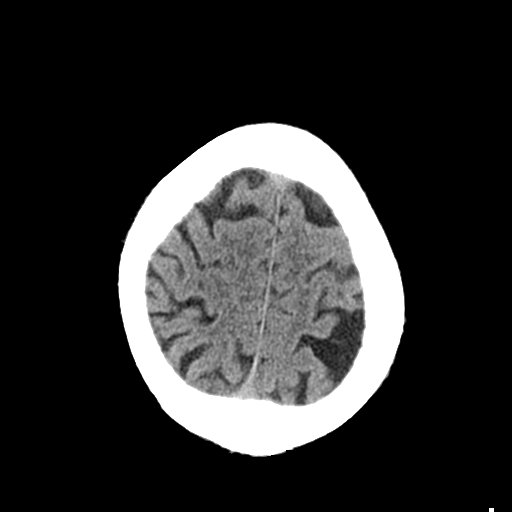
[im 26/30  brain]
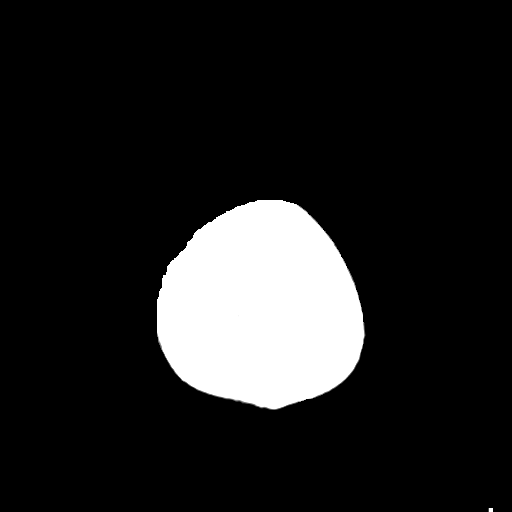

[Series 3: head bone · axial · 0.44mm/px · z∈[+1218,+1232]mm · 2 of 74 slices shown]
[im 8/74  bone]
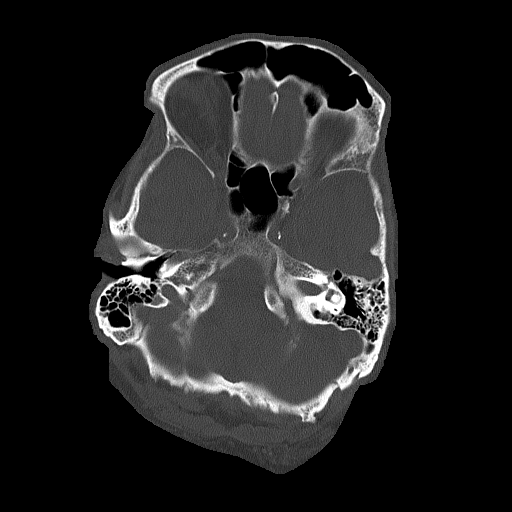
[im 15/74  bone]
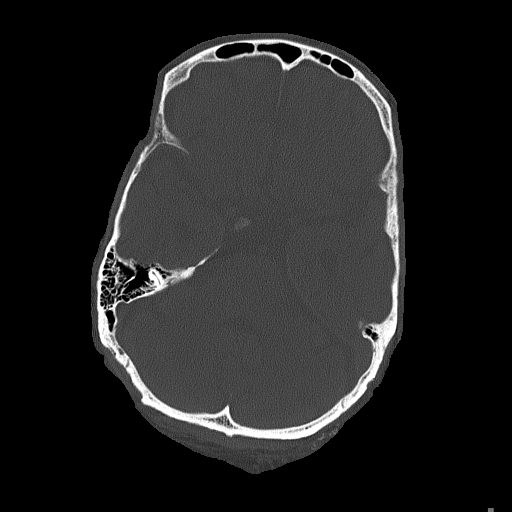

[Series 4: coronal soft · coronal · 0.32mm/px · 3 of 70 slices shown]
[im 24/70  brain]
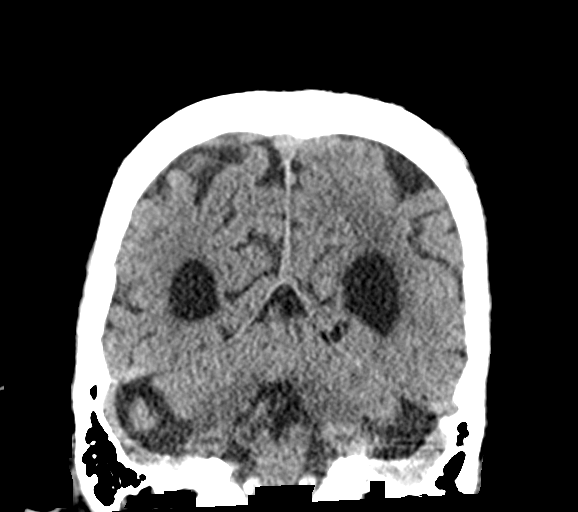
[im 31/70  brain]
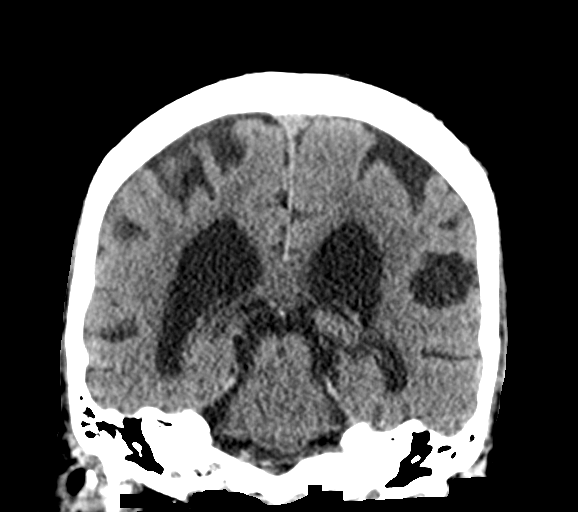
[im 39/70  brain]
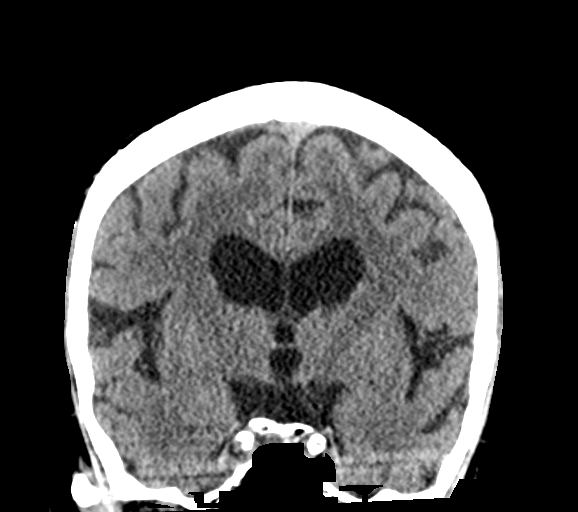

[Series 5: sagittal soft · sagittal · 0.30mm/px · 3 of 52 slices shown]
[im 18/52  brain]
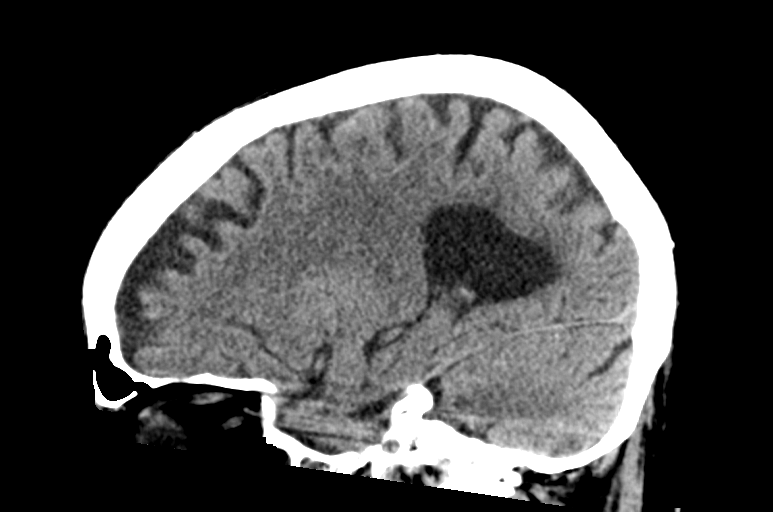
[im 26/52  brain]
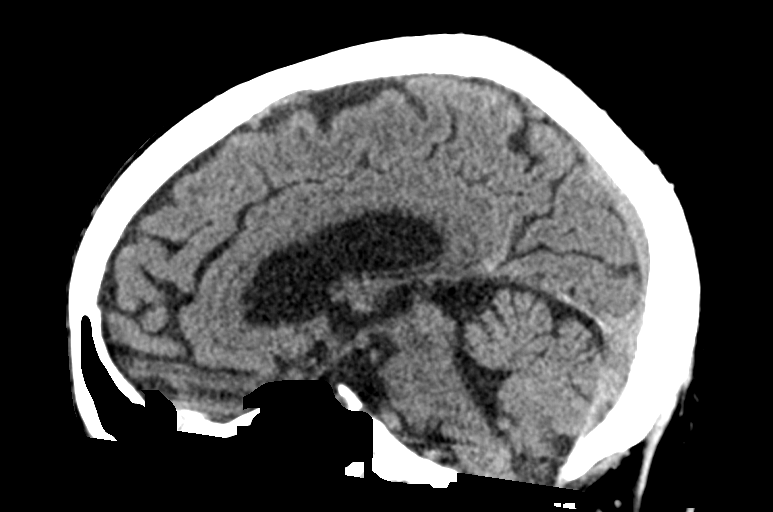
[im 35/52  brain]
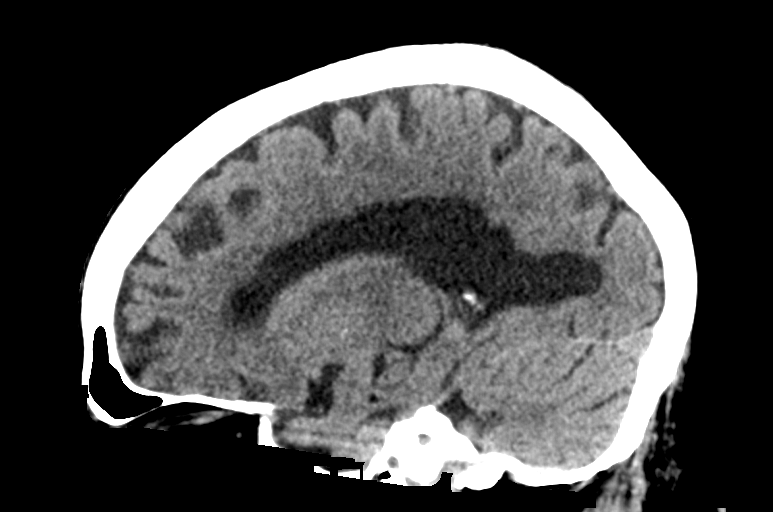

[15 of 47 positions shown; findings below may reference images not displayed]

FINDINGS: Brain: There is no evidence of an acute infarct, intracranial
hemorrhage, mass, midline shift, or extra-axial fluid collection.
Mild to moderate cerebral atrophy is unchanged. Hypodensities in the
cerebral white matter are nonspecific but compatible with minimal
chronic small vessel ischemic disease.

Vascular: Calcified atherosclerosis at the skull base. No hyperdense
vessel.

Skull: No acute fracture or suspicious osseous lesion.

Sinuses/Orbits: Visualized paranasal sinuses and mastoid air cells
are clear. Visualized orbits are unremarkable.

Other: None.
IMPRESSION: 1. No evidence of acute intracranial abnormality.
2. Cerebral atrophy and minimal chronic small vessel ischemic
disease.

## 2023-10-05 ENCOUNTER — Ambulatory Visit: Payer: Medicare PPO | Admitting: Urology
